# Patient Record
Sex: Female | Born: 1950 | Race: White | Hispanic: No | Marital: Single | Smoking: Never smoker
Health system: Southern US, Community
[De-identification: ages and names within clinical notes are randomized; demographics above are authoritative.]

## PROBLEM LIST (undated history)

## (undated) DIAGNOSIS — T7840XA Allergy, unspecified, initial encounter: Secondary | ICD-10-CM

## (undated) DIAGNOSIS — R739 Hyperglycemia, unspecified: Secondary | ICD-10-CM

## (undated) DIAGNOSIS — R270 Ataxia, unspecified: Secondary | ICD-10-CM

## (undated) DIAGNOSIS — F0390 Unspecified dementia without behavioral disturbance: Secondary | ICD-10-CM

## (undated) DIAGNOSIS — I10 Essential (primary) hypertension: Secondary | ICD-10-CM

## (undated) DIAGNOSIS — E785 Hyperlipidemia, unspecified: Secondary | ICD-10-CM

## (undated) DIAGNOSIS — G4700 Insomnia, unspecified: Secondary | ICD-10-CM

## (undated) DIAGNOSIS — M545 Low back pain, unspecified: Secondary | ICD-10-CM

## (undated) DIAGNOSIS — K219 Gastro-esophageal reflux disease without esophagitis: Secondary | ICD-10-CM

## (undated) HISTORY — DX: Ataxia, unspecified: R27.0

## (undated) HISTORY — DX: Hyperglycemia, unspecified: R73.9

## (undated) HISTORY — DX: Low back pain, unspecified: M54.50

## (undated) HISTORY — DX: Low back pain: M54.5

## (undated) HISTORY — DX: Hyperlipidemia, unspecified: E78.5

## (undated) HISTORY — PX: CATARACT EXTRACTION: SUR2

## (undated) HISTORY — DX: Essential (primary) hypertension: I10

## (undated) HISTORY — DX: Insomnia, unspecified: G47.00

## (undated) HISTORY — DX: Allergy, unspecified, initial encounter: T78.40XA

## (undated) HISTORY — DX: Unspecified dementia, unspecified severity, without behavioral disturbance, psychotic disturbance, mood disturbance, and anxiety: F03.90

## (undated) HISTORY — DX: Gastro-esophageal reflux disease without esophagitis: K21.9

---

## 1989-11-08 HISTORY — PX: ABDOMINAL HYSTERECTOMY: SUR658

## 2009-08-19 LAB — HM DEXA SCAN: HM Dexa Scan: NORMAL

## 2012-02-02 ENCOUNTER — Ambulatory Visit: Payer: Self-pay | Admitting: Ophthalmology

## 2012-02-02 DIAGNOSIS — Z01810 Encounter for preprocedural cardiovascular examination: Secondary | ICD-10-CM

## 2012-02-15 ENCOUNTER — Ambulatory Visit: Payer: Self-pay | Admitting: Ophthalmology

## 2013-11-08 HISTORY — PX: SHOULDER SURGERY: SHX246

## 2015-03-02 NOTE — Op Note (Signed)
PATIENT NAME:  Melissa Wise, Melissa Wise MR#:  [id number] DATE OF BIRTH:  04-18-1951  DATE OF PROCEDURE:  02/15/2012  PREOPERATIVE DIAGNOSIS: Visually significant cataract of the left eye.   POSTOPERATIVE DIAGNOSIS: Visually significant cataract of the left eye.   OPERATIVE PROCEDURE: Cataract extraction by phacoemulsification with implant of intraocular lens to the left eye.   SURGEON: Birder Robson, MD  ANESTHESIA:  1. Managed anesthesia care.  2. 50-50 mixture of 0.75% bupivacaine and 4% Xylocaine given as a retrobulbar block.   COMPLICATIONS: None.   TECHNIQUE:  Stop and chop.   DESCRIPTION OF PROCEDURE: The patient was examined and consented for this procedure in the preoperative holding area and then brought back to the Operating Room where the anesthesia team employed managed anesthesia care.  3.5 milliliters of the aforementioned mixture were placed in the left orbit on an Atkinson needle without complication. The left eye was then prepped and draped in the usual sterile ophthalmic fashion. A lid speculum was placed. The side-port blade was used to create a paracentesis and the anterior chamber was filled with viscoelastic. The keratome was used to create a near clear corneal incision. The continuous curvilinear capsulorrhexis was performed with a cystotome followed by the capsulorrhexis forceps. Hydrodissection and hydrodelineation were carried out with BSS on a blunt cannula. The lens was removed in a stop and chop technique. The remaining cortical material was removed with the irrigation-aspiration handpiece. The capsular bag was inflated with viscoelastic and the Technis ZCB00 15.5-diopter lens, serial number [id number] was placed in the capsular bag without complication. The remaining viscoelastic was removed from the eye with the irrigation-aspiration handpiece. The wounds were hydrated. The anterior chamber was flushed with Miostat. The eye was inflated to a physiologic pressure and the  wounds were found to be water tight. The eye was dressed with Vigamox followed by Maxitrol ointment and a protective shield was placed.   The patient will followup with me in one day.   ____________________________ Livingston Diones. Samone Guhl, MD wlp:cms D: 02/15/2012 12:54:39 ET T: 02/15/2012 13:17:58 ET JOB#: [id number]  cc: Delicia Berens L. Shakthi Scipio, MD, <Dictator> Livingston Diones Jansel Vonstein MD ELECTRONICALLY SIGNED 02/16/2012 11:41

## 2015-10-24 LAB — FECAL OCCULT BLOOD, GUAIAC: Fecal Occult Blood: NEGATIVE

## 2015-12-07 LAB — HM MAMMOGRAPHY: HM MAMMO: NORMAL (ref 0–4)

## 2015-12-31 ENCOUNTER — Ambulatory Visit: Payer: Self-pay | Admitting: Internal Medicine

## 2016-01-07 ENCOUNTER — Telehealth: Payer: Self-pay

## 2016-01-07 ENCOUNTER — Ambulatory Visit: Payer: Self-pay | Admitting: Internal Medicine

## 2016-01-07 NOTE — Telephone Encounter (Signed)
We need to reschedule patients appointment on 01/21/16 because Dr. Mable Fill will not be here.

## 2016-01-08 NOTE — Telephone Encounter (Signed)
Called pt to reschedule 3/15 apt. Now on 3/22 at 11:30

## 2016-01-13 ENCOUNTER — Telehealth: Payer: Self-pay

## 2016-01-13 NOTE — Telephone Encounter (Signed)
Patient called has appointment scheduled for March 22 nd but wants earlier date. Mrs states her allergies have worsened and now having problems with her right hear.   [phone]

## 2016-01-13 NOTE — Telephone Encounter (Signed)
Pt scheduled  

## 2016-01-21 ENCOUNTER — Ambulatory Visit: Payer: Self-pay | Admitting: Internal Medicine

## 2016-01-28 ENCOUNTER — Ambulatory Visit: Payer: Self-pay | Admitting: Internal Medicine

## 2016-02-04 ENCOUNTER — Encounter: Payer: Self-pay | Admitting: Internal Medicine

## 2016-02-04 ENCOUNTER — Ambulatory Visit: Payer: Self-pay | Admitting: Internal Medicine

## 2016-02-04 VITALS — BP 159/87 | HR 96 | Temp 98.3°F | Wt 148.0 lb

## 2016-02-04 DIAGNOSIS — I10 Essential (primary) hypertension: Secondary | ICD-10-CM

## 2016-02-04 DIAGNOSIS — E785 Hyperlipidemia, unspecified: Secondary | ICD-10-CM | POA: Insufficient documentation

## 2016-02-04 MED ORDER — ENALAPRIL MALEATE 2.5 MG PO TABS: 2.5000 mg | ORAL_TABLET | Freq: Every day | ORAL | Status: DC

## 2016-02-04 MED ORDER — AMLODIPINE BESYLATE 10 MG PO TABS: 10.0000 mg | ORAL_TABLET | Freq: Every day | ORAL | Status: DC

## 2016-02-04 MED ORDER — LOVASTATIN 40 MG PO TABS: 40.0000 mg | ORAL_TABLET | Freq: Every day | ORAL | Status: DC

## 2016-02-04 MED ORDER — HYDROCHLOROTHIAZIDE 25 MG PO TABS: 25.0000 mg | ORAL_TABLET | Freq: Every day | ORAL | Status: DC

## 2016-02-04 NOTE — Progress Notes (Signed)
   Subjective:    Patient ID: Melissa Wise, female    DOB: [birth year], 65 y.o.   MRN: [id number]  HPI  Pt had previously been seen by [facility]. Pt was followed for cholesterol and BP. Pt has never taken insulin. Pt needs refills on medications.    Review of Systems     Objective:   Physical Exam  Constitutional: She is oriented to person, place, and time.  Cardiovascular: Normal rate, regular rhythm and normal heart sounds.   Pulmonary/Chest: Effort normal and breath sounds normal.  Neurological: She is alert and oriented to person, place, and time.     BP 159/87 mmHg  Pulse 96  Temp(Src) 98.3 F (36.8 C)  Wt 148 lb (67.132 kg)    Medication List       This list is accurate as of: 02/04/16 11:16 AM.  Always use your most recent med list.               amLODipine 10 MG tablet  Commonly known as:  NORVASC  Take 1 tablet (10 mg total) by mouth daily.     enalapril 2.5 MG tablet  Commonly known as:  VASOTEC  Take 1 tablet (2.5 mg total) by mouth daily.     hydrochlorothiazide 25 MG tablet  Commonly known as:  HYDRODIURIL  Take 1 tablet (25 mg total) by mouth daily.     lovastatin 40 MG tablet  Commonly known as:  MEVACOR  Take 1 tablet (40 mg total) by mouth daily. 1 and 1/2 tablets with dinner           Assessment & Plan:  Mardy was seen today for hypertension and hyperlipidemia.  Diagnoses and all orders for this visit:  Essential hypertension -     Comp Met (CMET) -     CBC w/Diff -     Urinalysis -     TSH -     Lipid Profile -     HgB A1c  Hyperlipidemia  Other orders -     lovastatin (MEVACOR) 40 MG tablet; Take 1 tablet (40 mg total) by mouth daily. 1 and 1/2 tablets with dinner -     enalapril (VASOTEC) 2.5 MG tablet; Take 1 tablet (2.5 mg total) by mouth daily. -     amLODipine (NORVASC) 10 MG tablet; Take 1 tablet (10 mg total) by mouth daily. -     hydrochlorothiazide (HYDRODIURIL) 25 MG tablet; Take 1 tablet (25 mg total)  by mouth daily.   Md f/u: 6 months Labs before: Met c, cbc, ua, tsh, lipid, a1c

## 2016-05-04 ENCOUNTER — Encounter: Payer: Self-pay | Admitting: Family Medicine

## 2016-05-04 ENCOUNTER — Ambulatory Visit (INDEPENDENT_AMBULATORY_CARE_PROVIDER_SITE_OTHER): Payer: PPO | Admitting: Family Medicine

## 2016-05-04 VITALS — BP 135/79 | HR 95 | Temp 99.2°F | Ht 68.0 in | Wt 142.0 lb

## 2016-05-04 DIAGNOSIS — Z23 Encounter for immunization: Secondary | ICD-10-CM

## 2016-05-04 DIAGNOSIS — E785 Hyperlipidemia, unspecified: Secondary | ICD-10-CM

## 2016-05-04 DIAGNOSIS — H269 Unspecified cataract: Secondary | ICD-10-CM | POA: Diagnosis not present

## 2016-05-04 DIAGNOSIS — I10 Essential (primary) hypertension: Secondary | ICD-10-CM | POA: Diagnosis not present

## 2016-05-04 LAB — UA/M W/RFLX CULTURE, ROUTINE
Bilirubin, UA: NEGATIVE
Glucose, UA: NEGATIVE
Ketones, UA: NEGATIVE
LEUKOCYTES UA: NEGATIVE
NITRITE UA: NEGATIVE
Protein, UA: NEGATIVE
Urobilinogen, Ur: 0.2 mg/dL (ref 0.2–1.0)
pH, UA: 7 (ref 5.0–7.5)

## 2016-05-04 LAB — MICROSCOPIC EXAMINATION: EPITHELIAL CELLS (NON RENAL): NONE SEEN /HPF (ref 0–10)

## 2016-05-04 LAB — MICROALBUMIN, URINE WAIVED
Creatinine, Urine Waived: 10 mg/dL (ref 10–300)
MICROALB, UR WAIVED: 10 mg/L (ref 0–19)
Microalb/Creat Ratio: <30 mg/g (ref <30)

## 2016-05-04 LAB — BAYER DCA HB A1C WAIVED: HB A1C: 5.6 % (ref <7.0)

## 2016-05-04 MED ORDER — AMLODIPINE BESYLATE 10 MG PO TABS: 10.0000 mg | ORAL_TABLET | Freq: Every day | ORAL | Status: DC

## 2016-05-04 MED ORDER — LOVASTATIN 40 MG PO TABS: 40.0000 mg | ORAL_TABLET | Freq: Every day | ORAL | Status: DC

## 2016-05-04 MED ORDER — ENALAPRIL MALEATE 2.5 MG PO TABS: 2.5000 mg | ORAL_TABLET | Freq: Every day | ORAL | Status: DC

## 2016-05-04 MED ORDER — HYDROCHLOROTHIAZIDE 25 MG PO TABS: 25.0000 mg | ORAL_TABLET | Freq: Every day | ORAL | Status: DC

## 2016-05-04 NOTE — Assessment & Plan Note (Signed)
Better on recheck. Continue current regimen. Continue to monitor. Call with any concerns. Refills given today. 

## 2016-05-04 NOTE — Assessment & Plan Note (Signed)
Rechecking levels today. Await results. Continue current regimen. Call with concerns.

## 2016-05-04 NOTE — Patient Instructions (Signed)
Pneumococcal Conjugate Vaccine (PCV13)   1. Why get vaccinated?  Vaccination can protect both children and adults from pneumococcal disease.  Pneumococcal disease is caused by bacteria that can spread from person to person through close contact. It can cause ear infections, and it can also lead to more serious infections of the:  · Lungs (pneumonia),  · Blood (bacteremia), and  · Covering of the brain and spinal cord (meningitis).  Pneumococcal pneumonia is most common among adults. Pneumococcal meningitis can cause deafness and brain damage, and it kills about 1 child in 10 who get it.  Anyone can get pneumococcal disease, but children under 2 years of age and adults 65 years and older, people with certain medical conditions, and cigarette smokers are at the highest risk.  Before there was a vaccine, the [place] saw:  · more than 700 cases of meningitis,  · about 13,000 blood infections,  · about 5 million ear infections, and  · about 200 deaths  in children under 5 each year from pneumococcal disease. Since vaccine became available, severe pneumococcal disease in these children has fallen by 88%.  About 18,000 older adults die of pneumococcal disease each year in the [place].  Treatment of pneumococcal infections with penicillin and other drugs is not as effective as it used to be, because some strains of the disease have become resistant to these drugs. This makes prevention of the disease, through vaccination, even more important.  2. PCV13 vaccine  Pneumococcal conjugate vaccine (called PCV13) protects against 13 types of pneumococcal bacteria.  PCV13 is routinely given to children at 2, 4, 6, and 12-15 months of age. It is also recommended for children and adults 2 to 64 years of age with certain health conditions, and for all adults 65 years of age and older. Your doctor can give you details.  3. Some people should not get this vaccine  Anyone who has ever had a life-threatening allergic reaction  to a dose of this vaccine, to an earlier pneumococcal vaccine called PCV7, or to any vaccine containing diphtheria toxoid (for example, DTaP), should not get PCV13.  Anyone with a severe allergy to any component of PCV13 should not get the vaccine. Tell your doctor if the person being vaccinated has any severe allergies.  If the person scheduled for vaccination is not feeling well, your healthcare provider might decide to reschedule the shot on another day.  4. Risks of a vaccine reaction  With any medicine, including vaccines, there is a chance of reactions. These are usually mild and go away on their own, but serious reactions are also possible.  Problems reported following PCV13 varied by age and dose in the series. The most common problems reported among children were:  · About half became drowsy after the shot, had a temporary loss of appetite, or had redness or tenderness where the shot was given.  · About 1 out of 3 had swelling where the shot was given.  · About 1 out of 3 had a mild fever, and about 1 in 20 had a fever over 102.2°F.  · Up to about 8 out of 10 became fussy or irritable.  Adults have reported pain, redness, and swelling where the shot was given; also mild fever, fatigue, headache, chills, or muscle pain.  Young children who get PCV13 along with inactivated flu vaccine at the same time may be at increased risk for seizures caused by fever. Ask your doctor for more information.  Problems that   could happen after any vaccine:  · People sometimes faint after a medical procedure, including vaccination. Sitting or lying down for about 15 minutes can help prevent fainting, and injuries caused by a fall. Tell your doctor if you feel dizzy, or have vision changes or ringing in the ears.  · Some older children and adults get severe pain in the shoulder and have difficulty moving the arm where a shot was given. This happens very rarely.  · Any medication can cause a severe allergic reaction. Such  reactions from a vaccine are very rare, estimated at about 1 in a million doses, and would happen within a few minutes to a few hours after the vaccination.  As with any medicine, there is a very small chance of a vaccine causing a serious injury or death.  The safety of vaccines is always being monitored. For more information, visit: www.cdc.gov/vaccinesafety/  5. What if there is a serious reaction?  What should I look for?  · Look for anything that concerns you, such as signs of a severe allergic reaction, very high fever, or unusual behavior.  Signs of a severe allergic reaction can include hives, swelling of the face and throat, difficulty breathing, a fast heartbeat, dizziness, and weakness-usually within a few minutes to a few hours after the vaccination.  What should I do?  · If you think it is a severe allergic reaction or other emergency that can't wait, call 9-1-1 or get the person to the nearest hospital. Otherwise, call your doctor.  Reactions should be reported to the Vaccine Adverse Event Reporting System (VAERS). Your doctor should file this report, or you can do it yourself through the VAERS web site at www.vaers.hhs.gov, or by calling 1-800-822-7967.  VAERS does not give medical advice.  6. The National Vaccine Injury Compensation Program  The National Vaccine Injury Compensation Program (VICP) is a federal program that was created to compensate people who may have been injured by certain vaccines.  Persons who believe they may have been injured by a vaccine can learn about the program and about filing a claim by calling 1-800-338-2382 or visiting the VICP website at www.hrsa.gov/vaccinecompensation. There is a time limit to file a claim for compensation.  7. How can I learn more?  · Ask your healthcare provider. He or she can give you the vaccine package insert or suggest other sources of information.  · Call your local or state health department.  · Contact the Centers for Disease Control and  Prevention (CDC):    Call 1-800-232-4636 (1-800-CDC-INFO) or    Visit CDC's website at www.cdc.gov/vaccines  Vaccine Information Statement  PCV13 Vaccine (09/12/2014)     This information is not intended to replace advice given to you by your health care provider. Make sure you discuss any questions you have with your health care provider.     Document Released: 08/22/2006 Document Revised: 11/15/2014 Document Reviewed: 09/19/2014  Elsevier Interactive Patient Education ©2016 Elsevier Inc.

## 2016-05-04 NOTE — Progress Notes (Signed)
BP 135/79 mmHg  Pulse 95  Temp(Src) 99.2 F (37.3 C)  Ht 5\' 8"  (1.727 m)  Wt 142 lb (64.411 kg)  BMI 21.60 kg/m2  SpO2 99%   Subjective:    Patient ID: Melissa Wise, female    DOB: Mar 09, 1951, 65 y.o.   MRN: [id number]  HPI: Melissa Wise is a 65 y.o. female who presents today to establish care  Chief Complaint  Patient presents with  . Hyperlipidemia    Patient needs a refill on her Cholesterol medication   Hasn't seen a doctor in about 6 months due to insurance. Was seeing [name] previously  HYPERTENSION / HYPERLIPIDEMIA Satisfied with current treatment? yes Duration of hypertension: chronic BP monitoring frequency: daily BP range: 110s-120/80s BP medication side effects: no Past BP meds: HCTZ, enlalapril, amlodipine Duration of hyperlipidemia: chronic Cholesterol medication side effects: no Cholesterol supplements: fish oil Past cholesterol medications: lovastatin Medication compliance: excellent compliance Aspirin: no Recent stressors: no Recurrent headaches: no Visual changes: no Palpitations: no Dyspnea: no Chest pain: no Lower extremity edema: no Dizzy/lightheaded: no  Active Ambulatory Problems    Diagnosis Date Noted  . Hypertension 02/04/2016  . Hyperlipidemia 02/04/2016   Resolved Ambulatory Problems    Diagnosis Date Noted  . No Resolved Ambulatory Problems   Past Medical History  Diagnosis Date  . Allergy   . GERD (gastroesophageal reflux disease)   . Insomnia   . Hyperglycemia    Past Surgical History  Procedure Laterality Date  . Shoulder surgery Right 2015  . Abdominal hysterectomy    . Cataract extraction Left    No Known Allergies  Family History  Problem Relation Age of Onset  . Cancer Mother   . Hypertension Father   . Heart attack Father   . Diabetes Maternal Grandmother   . Cancer Paternal Grandmother     Stomach   Social History   Social History  . Marital Status: Single    Spouse Name: N/A  . Number of  Children: N/A  . Years of Education: N/A   Occupational History  . Not on file.   Social History Main Topics  . Smoking status: Never Smoker   . Smokeless tobacco: Never Used  . Alcohol Use: 0.0 oz/week    0 Standard drinks or equivalent per week     Comment: occassionally  . Drug Use: No  . Sexual Activity: Not Currently   Other Topics Concern  . Not on file   Social History Narrative    Review of Systems  Constitutional: Negative.   Respiratory: Negative.   Cardiovascular: Negative.   Musculoskeletal: Negative.   Psychiatric/Behavioral: Negative.     Per HPI unless specifically indicated above     Objective:    BP 135/79 mmHg  Pulse 95  Temp(Src) 99.2 F (37.3 C)  Ht 5\' 8"  (1.727 m)  Wt 142 lb (64.411 kg)  BMI 21.60 kg/m2  SpO2 99%  Wt Readings from Last 3 Encounters:  05/04/16 142 lb (64.411 kg)  02/04/16 148 lb (67.132 kg)    Physical Exam  Constitutional: She is oriented to person, place, and time. She appears well-developed and well-nourished. No distress.  HENT:  Head: Normocephalic and atraumatic.  Right Ear: Hearing normal.  Left Ear: Hearing normal.  Nose: Nose normal.  Eyes: Conjunctivae and lids are normal. Right eye exhibits no discharge. Left eye exhibits no discharge. No scleral icterus.  Cardiovascular: Normal rate, regular rhythm, normal heart sounds and intact distal pulses.  Exam reveals no gallop and  no friction rub.   No murmur heard. Pulmonary/Chest: Effort normal and breath sounds normal. No respiratory distress. She has no wheezes. She has no rales. She exhibits no tenderness.  Musculoskeletal: Normal range of motion.  Neurological: She is alert and oriented to person, place, and time.  Skin: Skin is warm, dry and intact. No rash noted. No erythema. No pallor.  Psychiatric: She has a normal mood and affect. Her speech is normal and behavior is normal. Judgment and thought content normal. Cognition and memory are normal.  Nursing  note and vitals reviewed.   Results for orders placed or performed in visit on 02/02/12  Potassium  Result Value Ref Range   Potassium 3.5 3.5-5.1 mmol/L      Assessment & Plan:   Problem List Items Addressed This Visit      Cardiovascular and Mediastinum   Hypertension - Primary    Better on recheck. Continue current regimen. Continue to monitor. Call with any concerns. Refills given today.      Relevant Medications   amLODipine (NORVASC) 10 MG tablet   enalapril (VASOTEC) 2.5 MG tablet   hydrochlorothiazide (HYDRODIURIL) 25 MG tablet   lovastatin (MEVACOR) 40 MG tablet   Other Relevant Orders   Bayer DCA Hb A1c Waived   CBC with Differential/Platelet   Comprehensive metabolic panel   Microalbumin, Urine Waived   TSH   UA/M w/rflx Culture, Routine     Other   Hyperlipidemia    Rechecking levels today. Await results. Continue current regimen. Call with concerns.       Relevant Medications   amLODipine (NORVASC) 10 MG tablet   enalapril (VASOTEC) 2.5 MG tablet   hydrochlorothiazide (HYDRODIURIL) 25 MG tablet   lovastatin (MEVACOR) 40 MG tablet   Other Relevant Orders   Bayer DCA Hb A1c Waived   CBC with Differential/Platelet   Comprehensive metabolic panel   Lipid Panel w/o Chol/HDL Ratio    Other Visit Diagnoses    Immunization due        Prevnar given today    Cataract        Referral back to [facility]    Relevant Orders    Ambulatory referral to Ophthalmology        Follow up plan: Return in about 6 months (around 11/03/2016) for Wellness, Records Release from [facility] Please.

## 2016-05-05 ENCOUNTER — Encounter: Payer: Self-pay | Admitting: Family Medicine

## 2016-05-05 LAB — CBC WITH DIFFERENTIAL/PLATELET
BASOS: 0 %
Basophils Absolute: 0 10*3/uL (ref 0.0–0.2)
EOS (ABSOLUTE): 0 10*3/uL (ref 0.0–0.4)
Eos: 0 %
HEMATOCRIT: 43.3 % (ref 34.0–46.6)
HEMOGLOBIN: 14.4 g/dL (ref 11.1–15.9)
IMMATURE GRANS (ABS): 0 10*3/uL (ref 0.0–0.1)
IMMATURE GRANULOCYTES: 0 %
LYMPHS ABS: 2.2 10*3/uL (ref 0.7–3.1)
Lymphs: 27 %
MCH: 28.9 pg (ref 26.6–33.0)
MCHC: 33.3 g/dL (ref 31.5–35.7)
MCV: 87 fL (ref 79–97)
MONOS ABS: 0.4 10*3/uL (ref 0.1–0.9)
Monocytes: 5 %
NEUTROS ABS: 5.5 10*3/uL (ref 1.4–7.0)
NEUTROS PCT: 68 %
Platelets: 236 10*3/uL (ref 150–379)
RBC: 4.99 x10E6/uL (ref 3.77–5.28)
RDW: 14 % (ref 12.3–15.4)
WBC: 8.2 10*3/uL (ref 3.4–10.8)

## 2016-05-05 LAB — COMPREHENSIVE METABOLIC PANEL
ALBUMIN: 4.8 g/dL (ref 3.6–4.8)
ALK PHOS: 56 IU/L (ref 39–117)
ALT: 16 IU/L (ref 0–32)
AST: 23 IU/L (ref 0–40)
Albumin/Globulin Ratio: 2.2 (ref 1.2–2.2)
BILIRUBIN TOTAL: 0.7 mg/dL (ref 0.0–1.2)
BUN / CREAT RATIO: 12 (ref 12–28)
BUN: 9 mg/dL (ref 8–27)
CHLORIDE: 91 mmol/L — AB (ref 96–106)
CO2: 27 mmol/L (ref 18–29)
Calcium: 9.9 mg/dL (ref 8.7–10.3)
Creatinine, Ser: 0.75 mg/dL (ref 0.57–1.00)
GFR calc Af Amer: 97 mL/min/{1.73_m2} (ref >59)
GFR calc non Af Amer: 84 mL/min/{1.73_m2} (ref >59)
GLUCOSE: 95 mg/dL (ref 65–99)
Globulin, Total: 2.2 g/dL (ref 1.5–4.5)
Potassium: 4.1 mmol/L (ref 3.5–5.2)
Sodium: 134 mmol/L (ref 134–144)
Total Protein: 7 g/dL (ref 6.0–8.5)

## 2016-05-05 LAB — LIPID PANEL W/O CHOL/HDL RATIO
CHOLESTEROL TOTAL: 137 mg/dL (ref 100–199)
HDL: 65 mg/dL (ref >39)
LDL Calculated: 47 mg/dL (ref 0–99)
Triglycerides: 123 mg/dL (ref 0–149)
VLDL CHOLESTEROL CAL: 25 mg/dL (ref 5–40)

## 2016-05-05 LAB — TSH: TSH: 0.98 u[IU]/mL (ref 0.450–4.500)

## 2016-06-09 ENCOUNTER — Encounter: Payer: Self-pay | Admitting: Family Medicine

## 2016-06-09 DIAGNOSIS — R270 Ataxia, unspecified: Secondary | ICD-10-CM | POA: Insufficient documentation

## 2016-06-22 ENCOUNTER — Telehealth: Payer: Self-pay | Admitting: Family Medicine

## 2016-06-22 NOTE — Telephone Encounter (Signed)
Called pt, scheduled appt for 06/25/16 @ 1:30pm. Thanks.

## 2016-06-22 NOTE — Telephone Encounter (Signed)
Needs appointment

## 2016-06-22 NOTE — Telephone Encounter (Signed)
Pt called stated she is having a lot of pain in her left foot, wants to know if she should come in. Pt stated that her foot is swollen and that he has discussed this with Dr. Wynetta Emery previously. Pt needs advice. Please call pt ASAP. Thanks.

## 2016-06-25 ENCOUNTER — Ambulatory Visit (INDEPENDENT_AMBULATORY_CARE_PROVIDER_SITE_OTHER): Payer: PPO | Admitting: Family Medicine

## 2016-06-25 ENCOUNTER — Encounter: Payer: Self-pay | Admitting: Family Medicine

## 2016-06-25 VITALS — BP 132/78 | HR 87 | Temp 98.8°F | Wt 138.0 lb

## 2016-06-25 DIAGNOSIS — I10 Essential (primary) hypertension: Secondary | ICD-10-CM

## 2016-06-25 DIAGNOSIS — R29818 Other symptoms and signs involving the nervous system: Secondary | ICD-10-CM | POA: Diagnosis not present

## 2016-06-25 DIAGNOSIS — R2689 Other abnormalities of gait and mobility: Secondary | ICD-10-CM

## 2016-06-25 DIAGNOSIS — R609 Edema, unspecified: Secondary | ICD-10-CM

## 2016-06-25 DIAGNOSIS — R278 Other lack of coordination: Secondary | ICD-10-CM | POA: Diagnosis not present

## 2016-06-25 DIAGNOSIS — R41840 Attention and concentration deficit: Secondary | ICD-10-CM

## 2016-06-25 MED ORDER — LOSARTAN POTASSIUM 50 MG PO TABS: 50.0000 mg | ORAL_TABLET | Freq: Every day | ORAL | 1 refills | Status: DC

## 2016-06-25 NOTE — Assessment & Plan Note (Signed)
Cough on enalapril. Will stop it. Swelling on amlodipine. Will stop it. Will start losartan. Recheck BP 2 weeks. Cut in 1/2 and call if getting dizzy.

## 2016-06-25 NOTE — Progress Notes (Signed)
BP 132/78 (BP Location: Left Arm, Patient Position: Sitting, Cuff Size: Normal)   Pulse 87   Temp 98.8 F (37.1 C)   Wt 138 lb (62.6 kg)   SpO2 100%   BMI 20.98 kg/m    Subjective:    Patient ID: Melissa Wise, female    DOB: [birth year], 65 y.o.   MRN: [id number]  HPI: Melissa Wise is a 65 y.o. female  Chief Complaint  Patient presents with  . Foot Pain  . Edema  . Fatigue   FOOT PAIN Duration: 3 weeks foot has been swollen. R better than L one Involved foot: bilateral Mechanism of injury: unknown Location: bilateral feet Onset: sudden  Severity: tingling no discomfort  Quality:  tingling Frequency: intermittent Radiation: no Aggravating factors: nothing, amlodipine   Alleviating factors: nothing   Status: stable Treatments attempted: nothing   Relief with NSAIDs?:  No NSAIDs Taken Weakness with weight bearing or walking: no Morning stiffness: no Swelling: yes Redness: no Bruising: no Paresthesias / decreased sensation: yes  Fevers:no  Here today with her sister who expresses some concern about her. She notes that for the last couple of years, her balance and dexterity have decreased as has her concentration. 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 She now notes that she can't walk straight, that using her hands has become more difficult and that her health in general has declined. She was supposed to see a neurologist with her previous PCP, but lost her insurance before the appointment. She did have a MRI done in November which was normal. Her labs have been looking pretty normal as well.  Relevant past medical, surgical, family and social history reviewed and updated as indicated. Interim medical history since our last visit reviewed. Allergies and medications reviewed and updated.  Review of Systems  Constitutional: Negative.   Respiratory: Negative.   Cardiovascular: Positive for leg  swelling. Negative for chest pain and palpitations.  Neurological: Negative for dizziness, tremors, seizures, syncope, facial asymmetry, speech difficulty, light-headedness, numbness and headaches.  Hematological: Negative.   Psychiatric/Behavioral: Positive for confusion and decreased concentration. Negative for agitation, behavioral problems, dysphoric mood, hallucinations, self-injury, sleep disturbance and suicidal ideas. The patient is not nervous/anxious and is not hyperactive.     Per HPI unless specifically indicated above     Objective:    BP 132/78 (BP Location: Left Arm, Patient Position: Sitting, Cuff Size: Normal)   Pulse 87   Temp 98.8 F (37.1 C)   Wt 138 lb (62.6 kg)   SpO2 100%   BMI 20.98 kg/m   Wt Readings from Last 3 Encounters:  06/25/16 138 lb (62.6 kg)  05/04/16 142 lb (64.4 kg)  02/04/16 148 lb (67.1 kg)    Physical Exam  Constitutional: She is oriented to person, place, and time. She appears well-developed and well-nourished. No distress.  HENT:  Head: Normocephalic and atraumatic.  Right Ear: Hearing normal.  Left Ear: Hearing normal.  Nose: Nose normal.  Eyes: Conjunctivae and lids are normal. Right eye exhibits no discharge. Left eye exhibits no discharge. No scleral icterus.  Cardiovascular: Normal rate, regular rhythm, normal heart sounds and intact distal pulses.  Exam reveals no gallop and no friction rub.   No murmur heard. Pulmonary/Chest: Effort normal and breath sounds normal. No respiratory distress. She has no wheezes. She has no rales. She exhibits no tenderness.  Musculoskeletal: Normal range of motion. She exhibits edema (3+  edema on the L, 2+ edema on the R).  Neurological: She is alert and oriented to person, place, and time. She has normal reflexes.  No cogwheeling  Skin: Skin is warm, dry and intact. No rash noted. No erythema. No pallor.  Psychiatric: Her speech is normal. Judgment and thought content normal. Her affect is blunt.  She is slowed. Cognition and memory are normal.  Nursing note and vitals reviewed.   Results for orders placed or performed in visit on 06/09/16  HM MAMMOGRAPHY  Result Value Ref Range   HM Mammogram Self Reported Normal 0-4 Bi-Rad, Self Reported Normal  HM DEXA SCAN  Result Value Ref Range   HM Dexa Scan Normal   Fecal Occult Blood, Guaiac  Result Value Ref Range   Fecal Occult Blood Negative       Assessment & Plan:   Problem List Items Addressed This Visit      Cardiovascular and Mediastinum   Hypertension - Primary    Cough on enalapril. Will stop it. Swelling on amlodipine. Will stop it. Will start losartan. Recheck BP 2 weeks. Cut in 1/2 and call if getting dizzy.      Relevant Medications   losartan (COZAAR) 50 MG tablet    Other Visit Diagnoses    Edema, unspecified type       Likely due to amlodipine. Will stop it. Will elevate feet. Call if not better by Monday and will give her a couple of doses of lasix.   Balance problems       Unclear etiology. Will get her into see neurology. Referral generated today. MRI normal in 11/16, Labs normal in June.    Relevant Orders   Ambulatory referral to Neurology   Decreased dexterity       Unclear etiology. Will get her into see neurology. Referral generated today. MRI normal in 11/16, Labs normal in June.    Relevant Orders   Ambulatory referral to Neurology   Concentration deficit       Unclear etiology. Will get her into see neurology. Referral generated today. MRI normal in 11/16, Labs normal in June.    Relevant Orders   Ambulatory referral to Neurology       Follow up plan: Return in about 2 weeks (around 07/09/2016) for follow up BP.

## 2016-06-28 DIAGNOSIS — M2042 Other hammer toe(s) (acquired), left foot: Secondary | ICD-10-CM | POA: Diagnosis not present

## 2016-06-28 DIAGNOSIS — M2041 Other hammer toe(s) (acquired), right foot: Secondary | ICD-10-CM | POA: Diagnosis not present

## 2016-06-28 DIAGNOSIS — B351 Tinea unguium: Secondary | ICD-10-CM | POA: Diagnosis not present

## 2016-07-07 ENCOUNTER — Telehealth: Payer: Self-pay | Admitting: Family Medicine

## 2016-07-07 NOTE — Telephone Encounter (Signed)
Routing to provider.  Adding note to Losartan patient only taking 1/2.

## 2016-07-07 NOTE — Telephone Encounter (Signed)
While rescheduling her appt the pt stated she is only taking half of the losartan (COZAAR) 50 MG tablet because the 50MG  was too much. She also stated her feet and legs were still swelling.

## 2016-07-09 ENCOUNTER — Ambulatory Visit: Payer: PPO | Admitting: Family Medicine

## 2016-07-13 DIAGNOSIS — H26492 Other secondary cataract, left eye: Secondary | ICD-10-CM | POA: Diagnosis not present

## 2016-07-16 ENCOUNTER — Ambulatory Visit: Payer: PPO | Admitting: Family Medicine

## 2016-07-16 ENCOUNTER — Emergency Department: Payer: PPO

## 2016-07-16 ENCOUNTER — Inpatient Hospital Stay
Admission: EM | Admit: 2016-07-16 | Discharge: 2016-07-19 | DRG: 565 | Disposition: A | Discharge status: Skilled Nursing Facility | Payer: PPO | Attending: Specialist | Admitting: Specialist

## 2016-07-16 DIAGNOSIS — S199XXA Unspecified injury of neck, initial encounter: Secondary | ICD-10-CM | POA: Diagnosis not present

## 2016-07-16 DIAGNOSIS — W19XXXA Unspecified fall, initial encounter: Secondary | ICD-10-CM | POA: Diagnosis present

## 2016-07-16 DIAGNOSIS — Z79899 Other long term (current) drug therapy: Secondary | ICD-10-CM | POA: Diagnosis not present

## 2016-07-16 DIAGNOSIS — R1310 Dysphagia, unspecified: Secondary | ICD-10-CM | POA: Diagnosis not present

## 2016-07-16 DIAGNOSIS — E860 Dehydration: Secondary | ICD-10-CM | POA: Diagnosis not present

## 2016-07-16 DIAGNOSIS — D72829 Elevated white blood cell count, unspecified: Secondary | ICD-10-CM | POA: Diagnosis present

## 2016-07-16 DIAGNOSIS — G20 Parkinson's disease: Secondary | ICD-10-CM | POA: Diagnosis present

## 2016-07-16 DIAGNOSIS — F05 Delirium due to known physiological condition: Secondary | ICD-10-CM

## 2016-07-16 DIAGNOSIS — R296 Repeated falls: Secondary | ICD-10-CM | POA: Diagnosis not present

## 2016-07-16 DIAGNOSIS — R269 Unspecified abnormalities of gait and mobility: Secondary | ICD-10-CM

## 2016-07-16 DIAGNOSIS — T796XXA Traumatic ischemia of muscle, initial encounter: Principal | ICD-10-CM | POA: Diagnosis present

## 2016-07-16 DIAGNOSIS — E785 Hyperlipidemia, unspecified: Secondary | ICD-10-CM | POA: Diagnosis present

## 2016-07-16 DIAGNOSIS — S0990XA Unspecified injury of head, initial encounter: Secondary | ICD-10-CM | POA: Diagnosis not present

## 2016-07-16 DIAGNOSIS — R531 Weakness: Secondary | ICD-10-CM

## 2016-07-16 DIAGNOSIS — M6282 Rhabdomyolysis: Secondary | ICD-10-CM | POA: Diagnosis present

## 2016-07-16 DIAGNOSIS — Y92009 Unspecified place in unspecified non-institutional (private) residence as the place of occurrence of the external cause: Secondary | ICD-10-CM | POA: Diagnosis not present

## 2016-07-16 DIAGNOSIS — I10 Essential (primary) hypertension: Secondary | ICD-10-CM | POA: Diagnosis present

## 2016-07-16 DIAGNOSIS — E871 Hypo-osmolality and hyponatremia: Secondary | ICD-10-CM | POA: Diagnosis not present

## 2016-07-16 DIAGNOSIS — K219 Gastro-esophageal reflux disease without esophagitis: Secondary | ICD-10-CM | POA: Diagnosis present

## 2016-07-16 DIAGNOSIS — R2689 Other abnormalities of gait and mobility: Secondary | ICD-10-CM | POA: Diagnosis not present

## 2016-07-16 LAB — CBC
HEMATOCRIT: 40.6 % (ref 35.0–47.0)
Hemoglobin: 14.3 g/dL (ref 12.0–16.0)
MCH: 29.9 pg (ref 26.0–34.0)
MCHC: 35.2 g/dL (ref 32.0–36.0)
MCV: 85 fL (ref 80.0–100.0)
Platelets: 208 10*3/uL (ref 150–440)
RBC: 4.77 MIL/uL (ref 3.80–5.20)
RDW: 13.6 % (ref 11.5–14.5)
WBC: 17.7 10*3/uL — ABNORMAL HIGH (ref 3.6–11.0)

## 2016-07-16 LAB — BASIC METABOLIC PANEL
Anion gap: 6 (ref 5–15)
BUN: 12 mg/dL (ref 6–20)
CALCIUM: 9.2 mg/dL (ref 8.9–10.3)
CO2: 30 mmol/L (ref 22–32)
Chloride: 91 mmol/L — ABNORMAL LOW (ref 101–111)
Creatinine, Ser: 0.54 mg/dL (ref 0.44–1.00)
GFR calc Af Amer: >60 mL/min (ref >60)
GLUCOSE: 117 mg/dL — AB (ref 65–99)
Potassium: 3.7 mmol/L (ref 3.5–5.1)
Sodium: 127 mmol/L — ABNORMAL LOW (ref 135–145)

## 2016-07-16 LAB — TROPONIN I: TROPONIN I: 0.03 ng/mL — AB (ref <0.03)

## 2016-07-16 LAB — GLUCOSE, CAPILLARY: GLUCOSE-CAPILLARY: 113 mg/dL — AB (ref 65–99)

## 2016-07-16 MED ORDER — SODIUM CHLORIDE 0.9 % IV BOLUS (SEPSIS)
1000.0000 mL | Freq: Once | INTRAVENOUS | Status: AC
  Administered 2016-07-17: 1000 mL via INTRAVENOUS

## 2016-07-16 NOTE — ED Triage Notes (Addendum)
Pt reports to ED w/ c/o multiple falls today.  Pt remembers all falls, denies LOC, sts "I'm weak and my legs gave out".  Pt sts that she hit head x 3 times today.  Pt able to move all limbs w/ some pain.  Pt c/o soreness in back, neck and R shoulder.  Pt denies CP, SOB, LOC or dizziness. Pt alert, answers questions w/o issue. NAD. Pts family sts that pt has had gradual decline in weight and dexterity

## 2016-07-16 NOTE — ED Notes (Signed)
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 Pt reports she has problems controlling her muscles. Pt has neurology appointment scheduled for October 5th. Pt reports 3 falls today, and another in the past 6 months. Pt reports first fall was this afternoon over the trash can. Pt reports another fall in the kitchen, and one in the bedroom. Pt reports hitting her head with falls, however denies LOC. Pt denies weakness/dizziness, however pt required 2 RNs to move from wheelchair to bed. Pt was not able to support her body weight.

## 2016-07-16 NOTE — ED Notes (Signed)
Patient in CT scan at this time.

## 2016-07-16 NOTE — ED Provider Notes (Signed)
[facility] Emergency Department Provider Note   ____________________________________________   First MD Initiated Contact with Patient 07/16/16 2314     (approximate)  I have reviewed the triage vital signs and the nursing notes.   HISTORY  Chief Complaint Fall    HPI Melissa Wise is a 65 y.o. female who presents to the ED from home with a chief complaint of multiple falls and generalized weakness. Per patient's family member, she has had a steady decline in her health over the past 6 months. Patient has declined so much that she lost her job due to inability to work. She has had unsteady gait for which she is post to have an appointment with a neurologist this fall. However, patient has not had falls until today.Reports 4 falls today secondary to "I'm weak in my legs gave out". States that she struck her head 3 times. Denies use of anticoagulants. Complains of head and neck pain. States she was laying on the floor for over 3 hours before her family found her. Denies losing consciousness. She was able to ambulate with assistance after her falls. Denies use of cane or walker normally. Denies recent fever, chills, chest pain, shortness of breath, abdominal pain, nausea, vomiting, diarrhea. Denies recent travel. Nothing makes her symptoms better or worse.   Past Medical History:  Diagnosis Date  . Allergy   . Ataxia   . GERD (gastroesophageal reflux disease)   . Hyperglycemia   . Hyperlipidemia   . Hypertension   . Insomnia   . Low back pain     Patient Active Problem List   Diagnosis Date Noted  . Rhabdomyolysis 07/17/2016  . Ataxia   . Hypertension 02/04/2016  . Hyperlipidemia 02/04/2016    Past Surgical History:  Procedure Laterality Date  . ABDOMINAL HYSTERECTOMY  1991   Complete, no cancer  . CATARACT EXTRACTION Left   . SHOULDER SURGERY Right 2015    Prior to Admission medications   Medication Sig Start Date End Date Taking?  Authorizing Provider  b complex vitamins capsule Take 1 capsule by mouth daily.   Yes Historical Provider, MD  COCONUT OIL PO Take by mouth.   Yes Historical Provider, MD  hydrochlorothiazide (HYDRODIURIL) 25 MG tablet Take 1 tablet (25 mg total) by mouth daily. 05/04/16  Yes Megan P Johnson, DO  Kelp 0.15 MG TABS Take by mouth.   Yes Historical Provider, MD  losartan (COZAAR) 50 MG tablet Take 1 tablet (50 mg total) by mouth daily. 06/25/16  Yes Megan P Johnson, DO  lovastatin (MEVACOR) 40 MG tablet Take 1 tablet (40 mg total) by mouth daily. 1 and 1/2 tablets with dinner 05/04/16  Yes Megan P Johnson, DO  Magnesium 400 MG CAPS Take by mouth.   Yes Historical Provider, MD  Multiple Vitamin (MULTIVITAMIN) tablet Take 1 tablet by mouth daily.   Yes Historical Provider, MD  Omega-3 Fatty Acids (FISH OIL) 1000 MG CAPS Take 300 mg by mouth.    Yes Historical Provider, MD  OVER THE COUNTER MEDICATION VitaFusion Fiber Well, sugar free gummies with digestive health, regularity support and probiotic   Yes Historical Provider, MD  UNABLE TO FIND 500 mg. tumeric   Yes Historical Provider, MD  Vitamin D, Cholecalciferol, 1000 units CAPS Take 1,000 Units by mouth.   Yes Historical Provider, MD    Allergies Amlodipine and Enalapril  Family History  Problem Relation Age of Onset  . Cancer Mother   . Hypertension Father   .  Heart attack Father   . Diabetes Maternal Grandmother   . Cancer Paternal Grandmother     Stomach    Social History Social History  Substance Use Topics  . Smoking status: Never Smoker  . Smokeless tobacco: Never Used  . Alcohol use 0.0 oz/week     Comment: occassionally    Review of Systems  Constitutional: Positive for generalized weakness. No fever/chills. Eyes: No visual changes. ENT: No sore throat. Cardiovascular: Denies chest pain. Respiratory: Denies shortness of breath. Gastrointestinal: No abdominal pain.  No nausea, no vomiting.  No diarrhea.  No  constipation. Genitourinary: Negative for dysuria. Musculoskeletal: Negative for back pain. Skin: Negative for rash. Neurological: Positive for unsteady gait. Negative for headaches, focal weakness or numbness.  10-point ROS otherwise negative.  ____________________________________________   PHYSICAL EXAM:  VITAL SIGNS: ED Triage Vitals  Enc Vitals Group     BP 07/16/16 2054 (!) 91/59     Pulse Rate 07/16/16 2054 94     Resp 07/16/16 2054 20     Temp 07/16/16 2054 98.1 F (36.7 C)     Temp Source 07/16/16 2054 Oral     SpO2 07/16/16 2054 100 %     Weight 07/16/16 2054 135 lb (61.2 kg)     Height 07/16/16 2054 5\' 8"  (1.727 m)     Head Circumference --      Peak Flow --      Pain Score 07/16/16 2301 4     Pain Loc --      Pain Edu? --      Excl. in [place]? --     Constitutional: Alert and oriented. Well appearing and in no acute distress. Eyes: Conjunctivae are normal. PERRL. EOMI. Head: Atraumatic. Nose: No congestion/rhinnorhea. Mouth/Throat: Mucous membranes are moist.  Oropharynx non-erythematous. Neck: No stridor.  No cervical spine tenderness to palpation. Cardiovascular: Normal rate, regular rhythm. Grossly normal heart sounds.  Good peripheral circulation. Respiratory: Normal respiratory effort.  No retractions. Lungs CTAB. Gastrointestinal: Soft and nontender. No distention. No abdominal bruits. No CVA tenderness. Musculoskeletal: No lower extremity tenderness nor edema.  No joint effusions. Neurologic:  Normal speech and language. Delay cognition. No gross focal neurologic deficits are appreciated.  Skin:  Skin is warm, dry and intact. No rash noted. Psychiatric: Mood and affect are normal. Speech and behavior are normal.  ____________________________________________   LABS (all labs ordered are listed, but only abnormal results are displayed)  Labs Reviewed  BASIC METABOLIC PANEL - Abnormal; Notable for the following:       Result Value   Sodium 127 (*)     Chloride 91 (*)    Glucose, Bld 117 (*)    All other components within normal limits  CBC - Abnormal; Notable for the following:    WBC 17.7 (*)    All other components within normal limits  URINALYSIS COMPLETEWITH MICROSCOPIC ([facility] ONLY) - Abnormal; Notable for the following:    Color, Urine YELLOW (*)    APPearance HAZY (*)    Glucose, UA 150 (*)    All other components within normal limits  GLUCOSE, CAPILLARY - Abnormal; Notable for the following:    Glucose-Capillary 113 (*)    All other components within normal limits  CK - Abnormal; Notable for the following:    Total CK 1,345 (*)    All other components within normal limits  TROPONIN I - Abnormal; Notable for the following:    Troponin I 0.03 (*)    All other components  within normal limits  TSH  HEMOGLOBIN A1C  CBG MONITORING, ED   ____________________________________________  EKG  ED ECG REPORT I, Rozalia Dino J, the attending physician, personally viewed and interpreted this ECG.   Date: 07/16/2016  EKG Time: 2111  Rate: 91  Rhythm: normal EKG, normal sinus rhythm  Axis: Normal  Intervals:none  ST&T Change: Nonspecific  ____________________________________________  RADIOLOGY  CT head and cervical spine interpreted per Dr. Quintella Reichert: No acute intracranial hemorrhage.    No acute/traumatic cervical spine pathology.    ____________________________________________   PROCEDURES  Procedure(s) performed: None  Procedures  Critical Care performed: No  ____________________________________________   INITIAL IMPRESSION / ASSESSMENT AND PLAN / ED COURSE  Pertinent labs & imaging results that were available during my care of the patient were reviewed by me and considered in my medical decision making (see chart for details).  65 year old female who presents status post frequent falls today; steady decline in her health over the past 6 months. CT imaging studies are negative for acute traumatic injuries.  Laboratory results demonstrate leukocytosis, hyponatremia. Will check urinalysis, add chest x-ray and orthostatic vital signs. Anticipate hospital admission for generalized weakness and frequent falls secondary to unsteady gait.  Clinical Course  Comment By Time  Laboratory results notable for rhabdomyolysis and elevated troponin. Patient required heavy assistance obtaining orthostatics. Will discuss with hospitalist to evaluate patient in the emergency department for admission. Paulette Blanch, MD 09/09 [phone]     ____________________________________________   FINAL CLINICAL IMPRESSION(S) / ED DIAGNOSES  Final diagnoses:  Fall, initial encounter  Weakness  Gait abnormality  Hyponatremia  Traumatic rhabdomyolysis, initial encounter ([place])      NEW MEDICATIONS STARTED DURING THIS VISIT:  Current Discharge Medication List       Note:  This document was prepared using Dragon voice recognition software and may include unintentional dictation errors.    Paulette Blanch, MD 07/17/16 [phone]

## 2016-07-17 DIAGNOSIS — M6282 Rhabdomyolysis: Secondary | ICD-10-CM | POA: Diagnosis present

## 2016-07-17 DIAGNOSIS — R296 Repeated falls: Secondary | ICD-10-CM | POA: Diagnosis not present

## 2016-07-17 DIAGNOSIS — I10 Essential (primary) hypertension: Secondary | ICD-10-CM | POA: Diagnosis not present

## 2016-07-17 DIAGNOSIS — E871 Hypo-osmolality and hyponatremia: Secondary | ICD-10-CM | POA: Diagnosis not present

## 2016-07-17 DIAGNOSIS — G20 Parkinson's disease: Secondary | ICD-10-CM | POA: Diagnosis not present

## 2016-07-17 DIAGNOSIS — R531 Weakness: Secondary | ICD-10-CM | POA: Diagnosis not present

## 2016-07-17 LAB — URINALYSIS COMPLETE WITH MICROSCOPIC (ARMC ONLY)
BILIRUBIN URINE: NEGATIVE
Bacteria, UA: NONE SEEN
GLUCOSE, UA: 150 mg/dL — AB
HGB URINE DIPSTICK: NEGATIVE
Ketones, ur: NEGATIVE mg/dL
Leukocytes, UA: NEGATIVE
Nitrite: NEGATIVE
Protein, ur: NEGATIVE mg/dL
Specific Gravity, Urine: 1.016 (ref 1.005–1.030)
Squamous Epithelial / LPF: NONE SEEN
pH: 6 (ref 5.0–8.0)

## 2016-07-17 LAB — HEMOGLOBIN A1C: Hgb A1c MFr Bld: 5.2 % (ref 4.0–6.0)

## 2016-07-17 LAB — TSH: TSH: 0.832 u[IU]/mL (ref 0.350–4.500)

## 2016-07-17 LAB — CK: Total CK: 1345 U/L — ABNORMAL HIGH (ref 38–234)

## 2016-07-17 MED ORDER — HYDROCHLOROTHIAZIDE 25 MG PO TABS: 25.0000 mg | ORAL_TABLET | Freq: Every day | ORAL | Status: DC

## 2016-07-17 MED ORDER — ACETAMINOPHEN 650 MG RE SUPP: 650.0000 mg | Freq: Four times a day (QID) | RECTAL | Status: DC | PRN

## 2016-07-17 MED ORDER — B COMPLEX-C PO TABS
1.0000 | ORAL_TABLET | Freq: Every day | ORAL | Status: DC
  Administered 2016-07-17 – 2016-07-19 (×3): 1 via ORAL
  Filled 2016-07-17 (×4): qty 1

## 2016-07-17 MED ORDER — SODIUM CHLORIDE 0.9 % IV SOLN
INTRAVENOUS | Status: DC
  Administered 2016-07-17 (×2): via INTRAVENOUS

## 2016-07-17 MED ORDER — DOCUSATE SODIUM 100 MG PO CAPS
100.0000 mg | ORAL_CAPSULE | Freq: Two times a day (BID) | ORAL | Status: DC
  Administered 2016-07-17 – 2016-07-19 (×4): 100 mg via ORAL
  Filled 2016-07-17 (×4): qty 1

## 2016-07-17 MED ORDER — SODIUM CHLORIDE 0.9% FLUSH
3.0000 mL | Freq: Two times a day (BID) | INTRAVENOUS | Status: DC
  Administered 2016-07-17 – 2016-07-19 (×4): 3 mL via INTRAVENOUS

## 2016-07-17 MED ORDER — ACETAMINOPHEN 325 MG PO TABS
650.0000 mg | ORAL_TABLET | Freq: Four times a day (QID) | ORAL | Status: DC | PRN
  Administered 2016-07-17: 650 mg via ORAL
  Filled 2016-07-17: qty 2

## 2016-07-17 MED ORDER — B COMPLEX VITAMINS PO CAPS: 1.0000 | ORAL_CAPSULE | Freq: Every day | ORAL | Status: DC

## 2016-07-17 MED ORDER — ENOXAPARIN SODIUM 40 MG/0.4ML ~~LOC~~ SOLN
40.0000 mg | SUBCUTANEOUS | Status: DC
  Administered 2016-07-17 – 2016-07-18 (×2): 40 mg via SUBCUTANEOUS
  Filled 2016-07-17 (×2): qty 0.4

## 2016-07-17 MED ORDER — VITAMIN D 1000 UNITS PO TABS
1000.0000 [IU] | ORAL_TABLET | Freq: Every day | ORAL | Status: DC
  Administered 2016-07-17 – 2016-07-19 (×3): 1000 [IU] via ORAL
  Filled 2016-07-17 (×3): qty 1

## 2016-07-17 MED ORDER — ONDANSETRON HCL 4 MG/2ML IJ SOLN: 4.0000 mg | Freq: Four times a day (QID) | INTRAMUSCULAR | Status: DC | PRN

## 2016-07-17 MED ORDER — ONDANSETRON HCL 4 MG PO TABS
4.0000 mg | ORAL_TABLET | Freq: Four times a day (QID) | ORAL | Status: DC | PRN
Start: 2016-07-17 — End: 2016-07-19

## 2016-07-17 MED ORDER — LOSARTAN POTASSIUM 50 MG PO TABS: 25.0000 mg | ORAL_TABLET | Freq: Every day | ORAL | Status: DC

## 2016-07-17 MED ORDER — ADULT MULTIVITAMIN W/MINERALS CH
1.0000 | ORAL_TABLET | Freq: Every day | ORAL | Status: DC
  Administered 2016-07-17 – 2016-07-19 (×3): 1 via ORAL
  Filled 2016-07-17 (×3): qty 1

## 2016-07-17 NOTE — ED Notes (Signed)
Called floor to let them know pt on the way 

## 2016-07-17 NOTE — Progress Notes (Signed)
Pt has orders for telemetry and continuous pulse ox. Per Dr. Posey Pronto, both orders may be discontinued. [facility] tele has been notified.

## 2016-07-17 NOTE — ED Notes (Signed)
MD Diamond at bedside. 

## 2016-07-17 NOTE — Progress Notes (Signed)
Patient transferred from [facility] to my assignment at 3pm.

## 2016-07-17 NOTE — Progress Notes (Addendum)
[facility] [place] NAME: Burnadine Fullam    MR#:  [id number]  DATE OF BIRTH:  [birth year]  SUBJECTIVE:  Came in with progressive weakness and recurrent falls at home Found to have rhabdo  REVIEW OF SYSTEMS:   Review of Systems  Constitutional: Negative for chills, fever and weight loss.  HENT: Negative for ear discharge, ear pain and nosebleeds.   Eyes: Negative for blurred vision, pain and discharge.  Respiratory: Negative for sputum production, shortness of breath, wheezing and stridor.   Cardiovascular: Negative for chest pain, palpitations, orthopnea and PND.  Gastrointestinal: Negative for abdominal pain, diarrhea, nausea and vomiting.  Genitourinary: Negative for frequency and urgency.  Musculoskeletal: Positive for falls and myalgias. Negative for back pain and joint pain.  Neurological: Positive for weakness. Negative for sensory change, speech change and focal weakness.  Psychiatric/Behavioral: Negative for depression and hallucinations. The patient is not nervous/anxious.    Tolerating Diet: Tolerating PT:   DRUG ALLERGIES:   Allergies  Allergen Reactions  . Amlodipine Swelling  . Enalapril Cough    VITALS:  Blood pressure 103/60, pulse 74, temperature 97.9 F (36.6 C), temperature source Oral, resp. rate 18, height 5\' 8"  (1.727 m), weight 63.5 kg (139 lb 14.4 oz), SpO2 100 %.  PHYSICAL EXAMINATION:   Physical Exam  GENERAL:  65 y.o.-year-old patient lying in the bed with no acute distress.  EYES: Pupils equal, round, reactive to light and accommodation. No scleral icterus. Extraocular muscles intact.  HEENT: Head atraumatic, normocephalic. Oropharynx and nasopharynx clear.  NECK:  Supple, no jugular venous distention. No thyroid enlargement, no tenderness.  LUNGS: Normal breath sounds bilaterally, no wheezing, rales, rhonchi. No use of accessory muscles of respiration.  CARDIOVASCULAR: S1, S2 normal. No  murmurs, rubs, or gallops.  ABDOMEN: Soft, nontender, nondistended. Bowel sounds present. No organomegaly or mass.  EXTREMITIES: No cyanosis, clubbing or edema b/l.    NEUROLOGIC: Cranial nerves II through XII are intact. No focal Motor or sensory deficits b/l.  Subjective weakness PSYCHIATRIC:  patient is alert and oriented x 3.  SKIN: No obvious rash, lesion, or ulcer.   LABORATORY PANEL:  CBC  Recent Labs Lab 07/16/16 2231  WBC 17.7*  HGB 14.3  HCT 40.6  PLT 208    Chemistries   Recent Labs Lab 07/16/16 2231  NA 127*  K 3.7  CL 91*  CO2 30  GLUCOSE 117*  BUN 12  CREATININE 0.54  CALCIUM 9.2   Cardiac Enzymes  Recent Labs Lab 07/16/16 2231  TROPONINI 0.03*   RADIOLOGY:  Ct Head Wo Contrast  Result Date: 07/16/2016 CLINICAL DATA:  65 year old female with fall EXAM: CT HEAD WITHOUT CONTRAST CT CERVICAL SPINE WITHOUT CONTRAST TECHNIQUE: Multidetector CT imaging of the head and cervical spine was performed following the standard protocol without intravenous contrast. Multiplanar CT image reconstructions of the cervical spine were also generated. COMPARISON:  None. FINDINGS: CT HEAD FINDINGS The ventricles and sulci appropriate size for patient's age. Minimal periventricular and deep white matter chronic microvascular ischemic changes noted. There is no acute intracranial hemorrhage. No mass effect or midline shift noted. The visualized paranasal sinuses and mastoid air cells are clear. The calvarium is intact. CT CERVICAL SPINE FINDINGS There is no acute fracture or subluxation of the cervical spine.There is osteopenia with multilevel degenerative changes with bone spurring. C2-C4 anterior on bridging osteophyte noted.The odontoid and spinous processes are intact.There is normal anatomic alignment of the C1-C2 lateral masses. The visualized  soft tissues appear unremarkable. IMPRESSION: No acute intracranial hemorrhage. No acute/traumatic cervical spine pathology.  Electronically Signed   By: Anner Crete M.D.   On: 07/16/2016 22:37   Ct Cervical Spine Wo Contrast  Result Date: 07/16/2016 CLINICAL DATA:  65 year old female with fall EXAM: CT HEAD WITHOUT CONTRAST CT CERVICAL SPINE WITHOUT CONTRAST TECHNIQUE: Multidetector CT imaging of the head and cervical spine was performed following the standard protocol without intravenous contrast. Multiplanar CT image reconstructions of the cervical spine were also generated. COMPARISON:  None. FINDINGS: CT HEAD FINDINGS The ventricles and sulci appropriate size for patient's age. Minimal periventricular and deep white matter chronic microvascular ischemic changes noted. There is no acute intracranial hemorrhage. No mass effect or midline shift noted. The visualized paranasal sinuses and mastoid air cells are clear. The calvarium is intact. CT CERVICAL SPINE FINDINGS There is no acute fracture or subluxation of the cervical spine.There is osteopenia with multilevel degenerative changes with bone spurring. C2-C4 anterior on bridging osteophyte noted.The odontoid and spinous processes are intact.There is normal anatomic alignment of the C1-C2 lateral masses. The visualized soft tissues appear unremarkable. IMPRESSION: No acute intracranial hemorrhage. No acute/traumatic cervical spine pathology. Electronically Signed   By: Anner Crete M.D.   On: 07/16/2016 22:37   Dg Chest Port 1 View  Result Date: 07/17/2016 CLINICAL DATA:  Status post multiple falls, with generalized weakness and upper back pain. Right shoulder pain. Initial encounter. EXAM: PORTABLE CHEST 1 VIEW COMPARISON:  None. FINDINGS: The lungs are well-aerated. Minimal left basilar atelectasis is noted. There is no evidence of pleural effusion or pneumothorax. The cardiomediastinal silhouette is within normal limits. No acute osseous abnormalities are seen. Hardware is noted along the proximal right humerus, incompletely imaged on this study. IMPRESSION: No  displaced rib fracture seen. Minimal left basilar atelectasis noted. Lungs otherwise clear. Electronically Signed   By: Garald Balding M.D.   On: 07/17/2016 00:55   ASSESSMENT AND PLAN:  65 year old female admitted for rhabdomyolysis. 1. Acute mild Rhabdomyolysis: Elevated CK; normal renal function. Hydrate with intravenous fluid. Hold statin drugs.  2. Motor slowing: Associated with masklike facies, mild dysphagia and cognitive rigidity consistent with possible Parkinson's. -Neurology consulted MRI brain as out pt negative  -3. Hypertension: Controlled; continue Cozaar  -d/ced HCTZ due to hyponatremia  4. DVT prophylaxis: Lovenox  Case discussed with Care Management/Social Worker. Management plans discussed with the patient, family and they are in agreement.  CODE STATUS: FULL   TOTAL TIME TAKING CARE OF THIS PATIENT: 36minutes.  >50% time spent on counselling and coordination of care  POSSIBLE D/C IN 1-2 DAYS, DEPENDING ON CLINICAL CONDITION.  Note: This dictation was prepared with Dragon dictation along with smaller phrase technology. Any transcriptional errors that result from this process are unintentional.  Lissie Hinesley M.D on 07/17/2016 at 3:22 PM  Between 7am to 6pm - Pager - [phone]  After 6pm go to [email or web address] - password [email or web address]  Office  [phone]  CC: Primary care physician; Park Liter, DO

## 2016-07-17 NOTE — H&P (Signed)
Melissa Wise is an 65 y.o. female.   Chief Complaint: Falls HPI: The patient with past medical history of hypertension presents to the emergency department after being found on the floor of her home. She denies loss of consciousness, chest pain, dizziness, nausea, vomiting or diarrhea. The patient states that she has fallen at least 4 times today the last which made her too weak to pick herself up. She reports that she did on the ground for at least 2 hours by the time she was able to reach help. She is transferred to the emergency department where she was found to have elevated creatinine kinase and generalized stiffness. The patient reports that she has some change to her voice. She also admits that she's been progressively weaker over 6 months and has been falling very frequently lately. Her primary care doctor had referred her to neurology but this appointment is not scheduled until next month. Due to generalized weakness, motor slowing and rhabdomyolysis emergency department staff called the hospitalist service for admission.  Past Medical History:  Diagnosis Date  . Allergy   . Ataxia   . GERD (gastroesophageal reflux disease)   . Hyperglycemia   . Hyperlipidemia   . Hypertension   . Insomnia   . Low back pain     Past Surgical History:  Procedure Laterality Date  . ABDOMINAL HYSTERECTOMY  1991   Complete, no cancer  . CATARACT EXTRACTION Left   . SHOULDER SURGERY Right 2015    Family History  Problem Relation Age of Onset  . Cancer Mother   . Hypertension Father   . Heart attack Father   . Diabetes Maternal Grandmother   . Cancer Paternal Grandmother     Stomach   Social History:  reports that she has never smoked. She has never used smokeless tobacco. She reports that she drinks alcohol. She reports that she does not use drugs.  Allergies:  Allergies  Allergen Reactions  . Amlodipine Swelling  . Enalapril Cough    Medications Prior to Admission  Medication Sig  Dispense Refill  . b complex vitamins capsule Take 1 capsule by mouth daily.    . COCONUT OIL PO Take by mouth.    . hydrochlorothiazide (HYDRODIURIL) 25 MG tablet Take 1 tablet (25 mg total) by mouth daily. 90 tablet 1  . Kelp 0.15 MG TABS Take by mouth.    . losartan (COZAAR) 50 MG tablet Take 1 tablet (50 mg total) by mouth daily. 30 tablet 1  . lovastatin (MEVACOR) 40 MG tablet Take 1 tablet (40 mg total) by mouth daily. 1 and 1/2 tablets with dinner 135 tablet 1  . Magnesium 400 MG CAPS Take by mouth.    . Multiple Vitamin (MULTIVITAMIN) tablet Take 1 tablet by mouth daily.    . Omega-3 Fatty Acids (FISH OIL) 1000 MG CAPS Take 300 mg by mouth.     [name] OVER THE COUNTER MEDICATION VitaFusion Fiber Well, sugar free gummies with digestive health, regularity support and probiotic    . UNABLE TO FIND 500 mg. tumeric    . Vitamin D, Cholecalciferol, 1000 units CAPS Take 1,000 Units by mouth.      Results for orders placed or performed during the hospital encounter of 07/16/16 (from the past 48 hour(s))  Urinalysis complete, with microscopic     Status: Abnormal   Collection Time: 07/16/16 12:15 AM  Result Value Ref Range   Color, Urine YELLOW (A) YELLOW   APPearance HAZY (A) CLEAR   Glucose,  UA 150 (A) NEGATIVE mg/dL   Bilirubin Urine NEGATIVE NEGATIVE   Ketones, ur NEGATIVE NEGATIVE mg/dL   Specific Gravity, Urine 1.016 1.005 - 1.030   Hgb urine dipstick NEGATIVE NEGATIVE   pH 6.0 5.0 - 8.0   Protein, ur NEGATIVE NEGATIVE mg/dL   Nitrite NEGATIVE NEGATIVE   Leukocytes, UA NEGATIVE NEGATIVE   RBC / HPF 0-5 0 - 5 RBC/hpf   WBC, UA 0-5 0 - 5 WBC/hpf   Bacteria, UA NONE SEEN NONE SEEN   Squamous Epithelial / LPF NONE SEEN NONE SEEN   Mucous PRESENT    Amorphous Crystal PRESENT   Basic metabolic panel     Status: Abnormal   Collection Time: 07/16/16 10:31 PM  Result Value Ref Range   Sodium 127 (L) 135 - 145 mmol/L   Potassium 3.7 3.5 - 5.1 mmol/L   Chloride 91 (L) 101 - 111 mmol/L    CO2 30 22 - 32 mmol/L   Glucose, Bld 117 (H) 65 - 99 mg/dL   BUN 12 6 - 20 mg/dL   Creatinine, Ser 0.54 0.44 - 1.00 mg/dL   Calcium 9.2 8.9 - 10.3 mg/dL   GFR calc non Af Amer >60 >60 mL/min   GFR calc Af Amer >60 >60 mL/min    Comment: (NOTE) The eGFR has been calculated using the CKD EPI equation. This calculation has not been validated in all clinical situations. eGFR's persistently <60 mL/min signify possible Chronic Kidney Disease.    Anion gap 6 5 - 15  CBC     Status: Abnormal   Collection Time: 07/16/16 10:31 PM  Result Value Ref Range   WBC 17.7 (H) 3.6 - 11.0 K/uL   RBC 4.77 3.80 - 5.20 MIL/uL   Hemoglobin 14.3 12.0 - 16.0 g/dL   HCT 40.6 35.0 - 47.0 %   MCV 85.0 80.0 - 100.0 fL   MCH 29.9 26.0 - 34.0 pg   MCHC 35.2 32.0 - 36.0 g/dL   RDW 13.6 11.5 - 14.5 %   Platelets 208 150 - 440 K/uL  CK     Status: Abnormal   Collection Time: 07/16/16 10:31 PM  Result Value Ref Range   Total CK 1,345 (H) 38 - 234 U/L  Troponin I     Status: Abnormal   Collection Time: 07/16/16 10:31 PM  Result Value Ref Range   Troponin I 0.03 (HH) <0.03 ng/mL    Comment: CRITICAL RESULT CALLED TO, READ BACK BY AND VERIFIED WITH JENNA ELLINGTON AT 0002 07/17/16.PMH  TSH     Status: None   Collection Time: 07/16/16 10:31 PM  Result Value Ref Range   TSH 0.832 0.350 - 4.500 uIU/mL  Glucose, capillary     Status: Abnormal   Collection Time: 07/16/16 10:32 PM  Result Value Ref Range   Glucose-Capillary 113 (H) 65 - 99 mg/dL   Ct Head Wo Contrast  Result Date: 07/16/2016 CLINICAL DATA:  65 year old female with fall EXAM: CT HEAD WITHOUT CONTRAST CT CERVICAL SPINE WITHOUT CONTRAST TECHNIQUE: Multidetector CT imaging of the head and cervical spine was performed following the standard protocol without intravenous contrast. Multiplanar CT image reconstructions of the cervical spine were also generated. COMPARISON:  None. FINDINGS: CT HEAD FINDINGS The ventricles and sulci appropriate size for  patient's age. Minimal periventricular and deep white matter chronic microvascular ischemic changes noted. There is no acute intracranial hemorrhage. No mass effect or midline shift noted. The visualized paranasal sinuses and mastoid air cells are clear. The calvarium  is intact. CT CERVICAL SPINE FINDINGS There is no acute fracture or subluxation of the cervical spine.There is osteopenia with multilevel degenerative changes with bone spurring. C2-C4 anterior on bridging osteophyte noted.The odontoid and spinous processes are intact.There is normal anatomic alignment of the C1-C2 lateral masses. The visualized soft tissues appear unremarkable. IMPRESSION: No acute intracranial hemorrhage. No acute/traumatic cervical spine pathology. Electronically Signed   By: Arash  Radparvar M.D.   On: 07/16/2016 22:37   Ct Cervical Spine Wo Contrast  Result Date: 07/16/2016 CLINICAL DATA:  65-year-old female with fall EXAM: CT HEAD WITHOUT CONTRAST CT CERVICAL SPINE WITHOUT CONTRAST TECHNIQUE: Multidetector CT imaging of the head and cervical spine was performed following the standard protocol without intravenous contrast. Multiplanar CT image reconstructions of the cervical spine were also generated. COMPARISON:  None. FINDINGS: CT HEAD FINDINGS The ventricles and sulci appropriate size for patient's age. Minimal periventricular and deep white matter chronic microvascular ischemic changes noted. There is no acute intracranial hemorrhage. No mass effect or midline shift noted. The visualized paranasal sinuses and mastoid air cells are clear. The calvarium is intact. CT CERVICAL SPINE FINDINGS There is no acute fracture or subluxation of the cervical spine.There is osteopenia with multilevel degenerative changes with bone spurring. C2-C4 anterior on bridging osteophyte noted.The odontoid and spinous processes are intact.There is normal anatomic alignment of the C1-C2 lateral masses. The visualized soft tissues appear  unremarkable. IMPRESSION: No acute intracranial hemorrhage. No acute/traumatic cervical spine pathology. Electronically Signed   By: Arash  Radparvar M.D.   On: 07/16/2016 22:37   Dg Chest Port 1 View  Result Date: 07/17/2016 CLINICAL DATA:  Status post multiple falls, with generalized weakness and upper back pain. Right shoulder pain. Initial encounter. EXAM: PORTABLE CHEST 1 VIEW COMPARISON:  None. FINDINGS: The lungs are well-aerated. Minimal left basilar atelectasis is noted. There is no evidence of pleural effusion or pneumothorax. The cardiomediastinal silhouette is within normal limits. No acute osseous abnormalities are seen. Hardware is noted along the proximal right humerus, incompletely imaged on this study. IMPRESSION: No displaced rib fracture seen. Minimal left basilar atelectasis noted. Lungs otherwise clear. Electronically Signed   By: Jeffery  Chang M.D.   On: 07/17/2016 00:55    Review of Systems  Constitutional: Negative for chills and fever.  HENT: Negative for sore throat and tinnitus.   Eyes: Negative for blurred vision and redness.  Respiratory: Negative for cough and shortness of breath.   Cardiovascular: Negative for chest pain, palpitations, orthopnea and PND.  Gastrointestinal: Negative for abdominal pain, diarrhea, nausea and vomiting.  Genitourinary: Negative for dysuria, frequency and urgency.  Musculoskeletal: Positive for falls. Negative for joint pain and myalgias.  Skin: Negative for rash.       No lesions  Neurological: Positive for weakness. Negative for speech change and focal weakness.  Endo/Heme/Allergies: Does not bruise/bleed easily.       No temperature intolerance  Psychiatric/Behavioral: Negative for depression and suicidal ideas.    Blood pressure 129/63, pulse 85, temperature 98 F (36.7 C), temperature source Oral, resp. rate 18, height 5' 8" (1.727 m), weight 63.5 kg (139 lb 14.4 oz), SpO2 100 %. Physical Exam  Vitals  reviewed. Constitutional: She is oriented to person, place, and time. She appears well-developed and well-nourished. No distress.  HENT:  Head: Normocephalic and atraumatic.  Mouth/Throat: Oropharynx is clear and moist.  Eyes: Conjunctivae and EOM are normal. Pupils are equal, round, and reactive to light. No scleral icterus.  Neck: Normal range of motion. Neck supple.   No JVD present. No tracheal deviation present. No thyromegaly present.  Cardiovascular: Normal rate, regular rhythm and normal heart sounds.  Exam reveals no gallop and no friction rub.   No murmur heard. Respiratory: Effort normal and breath sounds normal.  GI: Soft. Bowel sounds are normal. She exhibits no distension. There is no tenderness.  Genitourinary:  Genitourinary Comments: Deferred  Musculoskeletal: Normal range of motion. She exhibits no edema.  Lymphadenopathy:    She has no cervical adenopathy.  Neurological: She is alert and oriented to person, place, and time. No cranial nerve deficit. She exhibits normal muscle tone.  Rolling tremor  Skin: Skin is warm and dry. No rash noted. No erythema.  Psychiatric: She has a normal mood and affect. Her behavior is normal. Judgment and thought content normal.     Assessment/Plan This is a 65-year-old female admitted for rhabdomyolysis. 1. Rhabdomyolysis: Elevated CK; normal renal function. Hydrate with intravenous fluid. Hold statin drugs. 2. Motor slowing: Associated with masklike facies, mild dysphagia and cognitive rigidity consistent with Parkinson's. I place neurology consult for initiation of dopaminergic drugs. 3. Hypertension: Controlled; continue Cozaar and hydro-chlorothiazide 4. DVT prophylaxis: Lovenox 5. GI prophylaxis: None The patient is a full code. Time spent on admission orders and patient care proximally 45 minutes  ,   S, MD 07/17/2016, 6:18 AM   

## 2016-07-18 DIAGNOSIS — E871 Hypo-osmolality and hyponatremia: Secondary | ICD-10-CM | POA: Diagnosis not present

## 2016-07-18 DIAGNOSIS — R269 Unspecified abnormalities of gait and mobility: Secondary | ICD-10-CM | POA: Diagnosis not present

## 2016-07-18 DIAGNOSIS — T796XXD Traumatic ischemia of muscle, subsequent encounter: Secondary | ICD-10-CM | POA: Diagnosis not present

## 2016-07-18 DIAGNOSIS — I10 Essential (primary) hypertension: Secondary | ICD-10-CM | POA: Diagnosis not present

## 2016-07-18 DIAGNOSIS — T796XXA Traumatic ischemia of muscle, initial encounter: Principal | ICD-10-CM

## 2016-07-18 DIAGNOSIS — F05 Delirium due to known physiological condition: Secondary | ICD-10-CM | POA: Diagnosis not present

## 2016-07-18 DIAGNOSIS — R259 Unspecified abnormal involuntary movements: Secondary | ICD-10-CM | POA: Diagnosis not present

## 2016-07-18 DIAGNOSIS — G20 Parkinson's disease: Secondary | ICD-10-CM | POA: Diagnosis not present

## 2016-07-18 DIAGNOSIS — M6282 Rhabdomyolysis: Secondary | ICD-10-CM | POA: Diagnosis not present

## 2016-07-18 LAB — VITAMIN B12: Vitamin B-12: 414 pg/mL (ref 180–914)

## 2016-07-18 LAB — CK: CK TOTAL: 490 U/L — AB (ref 38–234)

## 2016-07-18 LAB — SODIUM: Sodium: 134 mmol/L — ABNORMAL LOW (ref 135–145)

## 2016-07-18 LAB — SEDIMENTATION RATE: SED RATE: 6 mm/h (ref 0–30)

## 2016-07-18 MED ORDER — CARBIDOPA-LEVODOPA 25-100 MG PO TABS
1.0000 | ORAL_TABLET | Freq: Two times a day (BID) | ORAL | Status: DC
  Administered 2016-07-19 (×2): 1 via ORAL
  Filled 2016-07-18 (×2): qty 1

## 2016-07-18 NOTE — Consult Note (Signed)
Reason for Consult:Falls Referring Physician: Verdell Carmine  CC: Falls  HPI: Flecia Shutter is an 65 y.o. female who presents after multiple falls in one day.  With the last fall was unable to get off the floor and remained there for a few hours before any was able to get to her.  Presented in rhabdo.  From review of the chart and per patient she has had stiffness and difficulty with gait for about 2 years now.  Was to see neurology but was unable to make appointments.  Describes memory problems that she says have only been present for about 2 weeks but per chart have at least been for 1 year.  Handwriting worse.  Patient reports voice is softer.  No tremor.  No reported hallucinations.  Stiff and slow.  Has had blood work to include TSH.  B12 low normal.  MRI of the brain was unremarkable.      Past Medical History:  Diagnosis Date  . Allergy   . Ataxia   . GERD (gastroesophageal reflux disease)   . Hyperglycemia   . Hyperlipidemia   . Hypertension   . Insomnia   . Low back pain     Past Surgical History:  Procedure Laterality Date  . ABDOMINAL HYSTERECTOMY  1991   Complete, no cancer  . CATARACT EXTRACTION Left   . SHOULDER SURGERY Right 2015    Family History  Problem Relation Age of Onset  . Cancer Mother   . Hypertension Father   . Heart attack Father   . Diabetes Maternal Grandmother   . Cancer Paternal Grandmother     Stomach    Social History:  reports that she has never smoked. She has never used smokeless tobacco. She reports that she drinks alcohol. She reports that she does not use drugs.  Allergies  Allergen Reactions  . Amlodipine Swelling  . Enalapril Cough    Medications:  I have reviewed the patient's current medications. Prior to Admission:  Prescriptions Prior to Admission  Medication Sig Dispense Refill Last Dose  . b complex vitamins capsule Take 1 capsule by mouth daily.   07/16/2016 at Unknown time  . COCONUT OIL PO Take by mouth.   07/16/2016 at  Unknown time  . hydrochlorothiazide (HYDRODIURIL) 25 MG tablet Take 1 tablet (25 mg total) by mouth daily. 90 tablet 1 07/16/2016 at Unknown time  . Kelp 0.15 MG TABS Take by mouth.   07/16/2016 at Unknown time  . losartan (COZAAR) 50 MG tablet Take 1 tablet (50 mg total) by mouth daily. 30 tablet 1 07/16/2016 at Unknown time  . lovastatin (MEVACOR) 40 MG tablet Take 1 tablet (40 mg total) by mouth daily. 1 and 1/2 tablets with dinner 135 tablet 1 07/16/2016 at Unknown time  . Magnesium 400 MG CAPS Take by mouth.   07/16/2016 at Unknown time  . Multiple Vitamin (MULTIVITAMIN) tablet Take 1 tablet by mouth daily.   07/16/2016 at Unknown time  . Omega-3 Fatty Acids (FISH OIL) 1000 MG CAPS Take 300 mg by mouth.    07/16/2016 at Unknown time  . OVER THE COUNTER MEDICATION VitaFusion Fiber Well, sugar free gummies with digestive health, regularity support and probiotic   07/16/2016 at Unknown time  . UNABLE TO FIND 500 mg. tumeric   07/16/2016 at Unknown time  . Vitamin D, Cholecalciferol, 1000 units CAPS Take 1,000 Units by mouth.   07/16/2016 at Unknown time   Scheduled: . B-complex with vitamin C  1 tablet Oral Daily  .  cholecalciferol  1,000 Units Oral Daily  . docusate sodium  100 mg Oral BID  . enoxaparin (LOVENOX) injection  40 mg Subcutaneous Q24H  . multivitamin with minerals  1 tablet Oral Daily  . sodium chloride flush  3 mL Intravenous Q12H    ROS: History obtained from the patient  General ROS: negative for - chills, fatigue, fever, night sweats, weight gain or weight loss Psychological ROS: memory difficulties Ophthalmic ROS: negative for - blurry vision, double vision, eye pain or loss of vision ENT ROS: negative for - epistaxis, nasal discharge, oral lesions, sore throat, tinnitus or vertigo Allergy and Immunology ROS: negative for - hives or itchy/watery eyes Hematological and Lymphatic ROS: negative for - bleeding problems, bruising or swollen lymph nodes Endocrine ROS: negative for -  galactorrhea, hair pattern changes, polydipsia/polyuria or temperature intolerance Respiratory ROS: negative for - cough, hemoptysis, shortness of breath or wheezing Cardiovascular ROS: leg swelling Gastrointestinal ROS: negative for - abdominal pain, diarrhea, hematemesis, nausea/vomiting or stool incontinence Genito-Urinary ROS: negative for - dysuria, hematuria, incontinence or urinary frequency/urgency Musculoskeletal ROS: negative for - joint swelling or muscular weakness Neurological ROS: as noted in HPI Dermatological ROS: negative for rash and skin lesion changes  Physical Examination: Blood pressure 99/64, pulse 73, temperature 98.1 F (36.7 C), temperature source Axillary, resp. rate 18, height '5\' 8"'  (1.727 m), weight 64.5 kg (142 lb 4.8 oz), SpO2 99 %.  HEENT-  Normocephalic, no lesions, without obvious abnormality.  Normal external eye and conjunctiva.  Normal TM's bilaterally.  Normal auditory canals and external ears. Normal external nose, mucus membranes and septum.  Normal pharynx.  Masked facies. Cardiovascular- S1, S2 normal, pulses palpable throughout   Lungs- chest clear, no wheezing, rales, normal symmetric air entry Abdomen- soft, non-tender; bowel sounds normal; no masses,  no organomegaly Extremities- 2+ edema Lymph-no adenopathy palpable Musculoskeletal-no joint tenderness, deformity or swelling Skin-warm and dry, no hyperpigmentation, vitiligo, or suspicious lesions  Neurological Examination Mental Status: Alert, oriented, thought content appropriate.  Speech fluent without evidence of aphasia.  Not able to count backward by 7's and must be prompted excessively to even understand the directions.  Able to follow 3 step commands without difficulty. Cranial Nerves: II: Discs flat bilaterally; Visual fields grossly normal, pupils equal, round, reactive to light and accommodation III,IV, VI: ptosis not present, extra-ocular motions intact bilaterally V,VII: smile  symmetric, facial light touch sensation normal bilaterally VIII: hearing normal bilaterally IX,X: gag reflex present XI: bilateral shoulder shrug XII: midline tongue extension Motor: 4/5 throughout with increased tone and bradykinesia.  Unable to extend arms and legs out fully as if mildly contracted Sensory: Pinprick and light touch intact throughout, bilaterally Deep Tendon Reflexes: 2+ in the upper extremities, 1+ at the knees and absent at the ankles.   Plantars: Right: equivocal   Left: equivocal Cerebellar: Normal finger-to-nose and normal heel-to-shin testing bilaterally Gait: not tested due to safety concerns.     Laboratory Studies:   Basic Metabolic Panel:  Recent Labs Lab 07/16/16 2231 07/18/16 0535  NA 127* 134*  K 3.7  --   CL 91*  --   CO2 30  --   GLUCOSE 117*  --   BUN 12  --   CREATININE 0.54  --   CALCIUM 9.2  --     Liver Function Tests: No results for input(s): AST, ALT, ALKPHOS, BILITOT, PROT, ALBUMIN in the last 168 hours. No results for input(s): LIPASE, AMYLASE in the last 168 hours. No results for input(s):  AMMONIA in the last 168 hours.  CBC:  Recent Labs Lab 07/16/16 2231  WBC 17.7*  HGB 14.3  HCT 40.6  MCV 85.0  PLT 208    Cardiac Enzymes:  Recent Labs Lab 07/16/16 2231 07/18/16 0535  CKTOTAL 1,345* 490*  TROPONINI 0.03*  --     BNP: Invalid input(s): POCBNP  CBG:  Recent Labs Lab 07/16/16 2232  GLUCAP 113*    Microbiology: Results for orders placed or performed in visit on 05/04/16  Microscopic Examination     Status: None   Collection Time: 05/04/16  3:25 PM  Result Value Ref Range Status   WBC, UA 0-5 0 - 5 /hpf Final   RBC, UA 0-2 0 - 2 /hpf Final   Epithelial Cells (non renal) None seen 0 - 10 /hpf Final   Bacteria, UA Few None seen/Few Final    Coagulation Studies: No results for input(s): LABPROT, INR in the last 72 hours.  Urinalysis:  Recent Labs Lab 07/16/16 0015  COLORURINE YELLOW*   LABSPEC 1.016  PHURINE 6.0  GLUCOSEU 150*  HGBUR NEGATIVE  BILIRUBINUR NEGATIVE  KETONESUR NEGATIVE  PROTEINUR NEGATIVE  NITRITE NEGATIVE  LEUKOCYTESUR NEGATIVE    Lipid Panel:     Component Value Date/Time   CHOL 137 05/04/2016 1525   TRIG 123 05/04/2016 1525   HDL 65 05/04/2016 1525   LDLCALC 47 05/04/2016 1525    HgbA1C:  Lab Results  Component Value Date   HGBA1C 5.2 07/16/2016    Urine Drug Screen:  No results found for: LABOPIA, COCAINSCRNUR, LABBENZ, AMPHETMU, THCU, LABBARB  Alcohol Level: No results for input(s): ETH in the last 168 hours.  Other results: EKG: normal sinus rhythm at 91 bpm.  Imaging: Ct Head Wo Contrast  Result Date: 07/16/2016 CLINICAL DATA:  65 year old female with fall EXAM: CT HEAD WITHOUT CONTRAST CT CERVICAL SPINE WITHOUT CONTRAST TECHNIQUE: Multidetector CT imaging of the head and cervical spine was performed following the standard protocol without intravenous contrast. Multiplanar CT image reconstructions of the cervical spine were also generated. COMPARISON:  None. FINDINGS: CT HEAD FINDINGS The ventricles and sulci appropriate size for patient's age. Minimal periventricular and deep white matter chronic microvascular ischemic changes noted. There is no acute intracranial hemorrhage. No mass effect or midline shift noted. The visualized paranasal sinuses and mastoid air cells are clear. The calvarium is intact. CT CERVICAL SPINE FINDINGS There is no acute fracture or subluxation of the cervical spine.There is osteopenia with multilevel degenerative changes with bone spurring. C2-C4 anterior on bridging osteophyte noted.The odontoid and spinous processes are intact.There is normal anatomic alignment of the C1-C2 lateral masses. The visualized soft tissues appear unremarkable. IMPRESSION: No acute intracranial hemorrhage. No acute/traumatic cervical spine pathology. Electronically Signed   By: Anner Crete M.D.   On: 07/16/2016 22:37   Ct  Cervical Spine Wo Contrast  Result Date: 07/16/2016 CLINICAL DATA:  65 year old female with fall EXAM: CT HEAD WITHOUT CONTRAST CT CERVICAL SPINE WITHOUT CONTRAST TECHNIQUE: Multidetector CT imaging of the head and cervical spine was performed following the standard protocol without intravenous contrast. Multiplanar CT image reconstructions of the cervical spine were also generated. COMPARISON:  None. FINDINGS: CT HEAD FINDINGS The ventricles and sulci appropriate size for patient's age. Minimal periventricular and deep white matter chronic microvascular ischemic changes noted. There is no acute intracranial hemorrhage. No mass effect or midline shift noted. The visualized paranasal sinuses and mastoid air cells are clear. The calvarium is intact. CT CERVICAL SPINE FINDINGS There is  no acute fracture or subluxation of the cervical spine.There is osteopenia with multilevel degenerative changes with bone spurring. C2-C4 anterior on bridging osteophyte noted.The odontoid and spinous processes are intact.There is normal anatomic alignment of the C1-C2 lateral masses. The visualized soft tissues appear unremarkable. IMPRESSION: No acute intracranial hemorrhage. No acute/traumatic cervical spine pathology. Electronically Signed   By: Anner Crete M.D.   On: 07/16/2016 22:37   Dg Chest Port 1 View  Result Date: 07/17/2016 CLINICAL DATA:  Status post multiple falls, with generalized weakness and upper back pain. Right shoulder pain. Initial encounter. EXAM: PORTABLE CHEST 1 VIEW COMPARISON:  None. FINDINGS: The lungs are well-aerated. Minimal left basilar atelectasis is noted. There is no evidence of pleural effusion or pneumothorax. The cardiomediastinal silhouette is within normal limits. No acute osseous abnormalities are seen. Hardware is noted along the proximal right humerus, incompletely imaged on this study. IMPRESSION: No displaced rib fracture seen. Minimal left basilar atelectasis noted. Lungs otherwise  clear. Electronically Signed   By: Garald Balding M.D.   On: 07/17/2016 00:55     Assessment/Plan: 65 year old female with increasing tone and memory deficits.  Neurological examination is somewhat unusual for PD but can not rule out a Parkinson's variant which may or may not be responsive to Sinemet.  Can not rule out other possibilities such a metabolic abnormalities and stiff man syndrome.  Further work up recommended.  Will not repeat imaging at this time.  CT of the cervical spine personally reviewed and shws no cord abnormalities.    Recommendations: 1.  ESR, B12, B1, heavy metal screen 2.  EEG 3.  Will give a trial of  Sinemet 25/100 to start in AM.    Alexis Goodell, MD Neurology [phone] 07/18/2016, 1:32 PM

## 2016-07-18 NOTE — Progress Notes (Signed)
[facility] [place] NAME: Melissa Wise    MR#:  [id number]  DATE OF BIRTH:  [birth year]  SUBJECTIVE:   Patient here due to acute rhabdomyolysis, dehydration. Still having some significant rigidness and stiffness in her upper extremities more than the lower extremities.  REVIEW OF SYSTEMS:   Review of Systems  Constitutional: Negative for chills, fever and weight loss.  HENT: Negative for ear discharge, ear pain and nosebleeds.   Eyes: Negative for blurred vision, pain and discharge.  Respiratory: Negative for sputum production, shortness of breath, wheezing and stridor.   Cardiovascular: Negative for chest pain, palpitations, orthopnea and PND.  Gastrointestinal: Negative for abdominal pain, diarrhea, nausea and vomiting.  Genitourinary: Negative for frequency and urgency.  Musculoskeletal: Positive for falls and myalgias. Negative for back pain and joint pain.  Neurological: Positive for weakness. Negative for sensory change, speech change and focal weakness.  Psychiatric/Behavioral: Negative for depression and hallucinations. The patient is not nervous/anxious.    Tolerating Diet: Heart Healthy Tolerating PT:  Await Eval.   DRUG ALLERGIES:   Allergies  Allergen Reactions  . Amlodipine Swelling  . Enalapril Cough    VITALS:  Blood pressure 99/64, pulse 73, temperature 98.1 F (36.7 C), temperature source Axillary, resp. rate 18, height 5\' 8"  (1.727 m), weight 64.5 kg (142 lb 4.8 oz), SpO2 99 %.  PHYSICAL EXAMINATION:   Physical Exam  GENERAL:  65 y.o.-year-old patient lying in the bed in no acute distress.  EYES: Pupils equal, round, reactive to light and accommodation. No scleral icterus. Extraocular muscles intact.  HEENT: Head atraumatic, normocephalic. Oropharynx and nasopharynx clear.  NECK:  Supple, no jugular venous distention. No thyroid enlargement, no tenderness.  LUNGS: Normal breath sounds bilaterally, no  wheezing, rales, rhonchi. No use of accessory muscles of respiration.  CARDIOVASCULAR: S1, S2 normal. No murmurs, rubs, or gallops.  ABDOMEN: Soft, nontender, nondistended. Bowel sounds present. No organomegaly or mass.  EXTREMITIES: No cyanosis, clubbing or edema b/l.    NEUROLOGIC: Cranial nerves II through XII are intact. No focal Motor or sensory deficits b/l.  Subjective weakness PSYCHIATRIC:  patient is alert and oriented x 3.  SKIN: No obvious rash, lesion, or ulcer.  Bruising noted on b/l knees with abrasions.   LABORATORY PANEL:  CBC  Recent Labs Lab 07/16/16 2231  WBC 17.7*  HGB 14.3  HCT 40.6  PLT 208    Chemistries   Recent Labs Lab 07/16/16 2231 07/18/16 0535  NA 127* 134*  K 3.7  --   CL 91*  --   CO2 30  --   GLUCOSE 117*  --   BUN 12  --   CREATININE 0.54  --   CALCIUM 9.2  --    Cardiac Enzymes  Recent Labs Lab 07/16/16 2231  TROPONINI 0.03*   RADIOLOGY:  Ct Head Wo Contrast  Result Date: 07/16/2016 CLINICAL DATA:  65 year old female with fall EXAM: CT HEAD WITHOUT CONTRAST CT CERVICAL SPINE WITHOUT CONTRAST TECHNIQUE: Multidetector CT imaging of the head and cervical spine was performed following the standard protocol without intravenous contrast. Multiplanar CT image reconstructions of the cervical spine were also generated. COMPARISON:  None. FINDINGS: CT HEAD FINDINGS The ventricles and sulci appropriate size for patient's age. Minimal periventricular and deep white matter chronic microvascular ischemic changes noted. There is no acute intracranial hemorrhage. No mass effect or midline shift noted. The visualized paranasal sinuses and mastoid air cells are clear. The calvarium is intact.  CT CERVICAL SPINE FINDINGS There is no acute fracture or subluxation of the cervical spine.There is osteopenia with multilevel degenerative changes with bone spurring. C2-C4 anterior on bridging osteophyte noted.The odontoid and spinous processes are intact.There is  normal anatomic alignment of the C1-C2 lateral masses. The visualized soft tissues appear unremarkable. IMPRESSION: No acute intracranial hemorrhage. No acute/traumatic cervical spine pathology. Electronically Signed   By: Anner Crete M.D.   On: 07/16/2016 22:37   Ct Cervical Spine Wo Contrast  Result Date: 07/16/2016 CLINICAL DATA:  66 year old female with fall EXAM: CT HEAD WITHOUT CONTRAST CT CERVICAL SPINE WITHOUT CONTRAST TECHNIQUE: Multidetector CT imaging of the head and cervical spine was performed following the standard protocol without intravenous contrast. Multiplanar CT image reconstructions of the cervical spine were also generated. COMPARISON:  None. FINDINGS: CT HEAD FINDINGS The ventricles and sulci appropriate size for patient's age. Minimal periventricular and deep white matter chronic microvascular ischemic changes noted. There is no acute intracranial hemorrhage. No mass effect or midline shift noted. The visualized paranasal sinuses and mastoid air cells are clear. The calvarium is intact. CT CERVICAL SPINE FINDINGS There is no acute fracture or subluxation of the cervical spine.There is osteopenia with multilevel degenerative changes with bone spurring. C2-C4 anterior on bridging osteophyte noted.The odontoid and spinous processes are intact.There is normal anatomic alignment of the C1-C2 lateral masses. The visualized soft tissues appear unremarkable. IMPRESSION: No acute intracranial hemorrhage. No acute/traumatic cervical spine pathology. Electronically Signed   By: Anner Crete M.D.   On: 07/16/2016 22:37   Dg Chest Port 1 View  Result Date: 07/17/2016 CLINICAL DATA:  Status post multiple falls, with generalized weakness and upper back pain. Right shoulder pain. Initial encounter. EXAM: PORTABLE CHEST 1 VIEW COMPARISON:  None. FINDINGS: The lungs are well-aerated. Minimal left basilar atelectasis is noted. There is no evidence of pleural effusion or pneumothorax. The  cardiomediastinal silhouette is within normal limits. No acute osseous abnormalities are seen. Hardware is noted along the proximal right humerus, incompletely imaged on this study. IMPRESSION: No displaced rib fracture seen. Minimal left basilar atelectasis noted. Lungs otherwise clear. Electronically Signed   By: Garald Balding M.D.   On: 07/17/2016 00:55   ASSESSMENT AND PLAN:  65 year old female admitted for rhabdomyolysis.  1. Acute mild Rhabdomyolysis: Traumatic due to falls.  - CK's have trended down with IV fluids and improving and will monitor.   - Cr. Stable.   2. Motor slowing: Associated with masklike facies, mild dysphagia and cognitive rigidity consistent with suspected Parkinson's. - discussed w/ Neurology and they will start a work up.  - await PT Eval.   3. Hypertension: normotensive. Hold HCTZ due to hyponatremia.   - hold Cozaar.   4. Hyponatremia - due to dehydration.  - Improved w/ IV fluids and will monitor.  - hold HCTZ   5. Leukocytosis - likely stress mediated. No infectious source and will monitor.  Case discussed with Care Management/Social Worker. Management plans discussed with the patient, family and they are in agreement.  CODE STATUS: FULL  TOTAL TIME TAKING CARE OF THIS PATIENT: 25 minutes.   POSSIBLE D/C IN 1-2 DAYS, DEPENDING ON CLINICAL CONDITION.  Note: This dictation was prepared with Dragon dictation along with smaller phrase technology. Any transcriptional errors that result from this process are unintentional.  Henreitta Leber M.D on 07/18/2016 at 12:12 PM  Between 7am to 6pm - Pager - [phone]  After 6pm go to [email or web address] - password [email or web address] [facility]  Office  [phone]  CC: Primary care physician; Park Liter, DO

## 2016-07-19 ENCOUNTER — Inpatient Hospital Stay (HOSPITAL_COMMUNITY): Payer: PPO

## 2016-07-19 DIAGNOSIS — I10 Essential (primary) hypertension: Secondary | ICD-10-CM | POA: Diagnosis not present

## 2016-07-19 DIAGNOSIS — M6282 Rhabdomyolysis: Secondary | ICD-10-CM | POA: Diagnosis not present

## 2016-07-19 DIAGNOSIS — F05 Delirium due to known physiological condition: Secondary | ICD-10-CM | POA: Diagnosis not present

## 2016-07-19 DIAGNOSIS — I872 Venous insufficiency (chronic) (peripheral): Secondary | ICD-10-CM | POA: Diagnosis not present

## 2016-07-19 DIAGNOSIS — W19XXXA Unspecified fall, initial encounter: Secondary | ICD-10-CM | POA: Diagnosis not present

## 2016-07-19 DIAGNOSIS — R259 Unspecified abnormal involuntary movements: Secondary | ICD-10-CM

## 2016-07-19 DIAGNOSIS — E119 Type 2 diabetes mellitus without complications: Secondary | ICD-10-CM | POA: Diagnosis not present

## 2016-07-19 DIAGNOSIS — R531 Weakness: Secondary | ICD-10-CM | POA: Diagnosis not present

## 2016-07-19 DIAGNOSIS — G20 Parkinson's disease: Secondary | ICD-10-CM | POA: Diagnosis not present

## 2016-07-19 DIAGNOSIS — Z7401 Bed confinement status: Secondary | ICD-10-CM | POA: Diagnosis not present

## 2016-07-19 DIAGNOSIS — G4700 Insomnia, unspecified: Secondary | ICD-10-CM | POA: Diagnosis not present

## 2016-07-19 DIAGNOSIS — T796XXD Traumatic ischemia of muscle, subsequent encounter: Secondary | ICD-10-CM

## 2016-07-19 DIAGNOSIS — T796XXA Traumatic ischemia of muscle, initial encounter: Secondary | ICD-10-CM | POA: Diagnosis not present

## 2016-07-19 DIAGNOSIS — M545 Low back pain: Secondary | ICD-10-CM | POA: Diagnosis not present

## 2016-07-19 DIAGNOSIS — K219 Gastro-esophageal reflux disease without esophagitis: Secondary | ICD-10-CM | POA: Diagnosis not present

## 2016-07-19 DIAGNOSIS — R6889 Other general symptoms and signs: Secondary | ICD-10-CM | POA: Diagnosis not present

## 2016-07-19 DIAGNOSIS — R269 Unspecified abnormalities of gait and mobility: Secondary | ICD-10-CM | POA: Diagnosis not present

## 2016-07-19 DIAGNOSIS — E871 Hypo-osmolality and hyponatremia: Secondary | ICD-10-CM | POA: Diagnosis not present

## 2016-07-19 DIAGNOSIS — E785 Hyperlipidemia, unspecified: Secondary | ICD-10-CM | POA: Diagnosis not present

## 2016-07-19 DIAGNOSIS — W19XXXD Unspecified fall, subsequent encounter: Secondary | ICD-10-CM | POA: Diagnosis not present

## 2016-07-19 DIAGNOSIS — E612 Magnesium deficiency: Secondary | ICD-10-CM | POA: Diagnosis not present

## 2016-07-19 LAB — HEAVY METALS, BLOOD
Arsenic: 7 ug/L (ref 2–23)
LEAD: 1 ug/dL (ref 0–19)
MERCURY: NOT DETECTED ug/L (ref 0.0–14.9)

## 2016-07-19 LAB — BASIC METABOLIC PANEL
ANION GAP: 5 (ref 5–15)
BUN: 9 mg/dL (ref 6–20)
CALCIUM: 8.3 mg/dL — AB (ref 8.9–10.3)
CHLORIDE: 103 mmol/L (ref 101–111)
CO2: 28 mmol/L (ref 22–32)
CREATININE: 0.49 mg/dL (ref 0.44–1.00)
GLUCOSE: 105 mg/dL — AB (ref 65–99)
POTASSIUM: 3.4 mmol/L — AB (ref 3.5–5.1)
SODIUM: 136 mmol/L (ref 135–145)

## 2016-07-19 LAB — CBC
HCT: 36.4 % (ref 35.0–47.0)
HEMOGLOBIN: 12.7 g/dL (ref 12.0–16.0)
MCH: 30.1 pg (ref 26.0–34.0)
MCHC: 34.8 g/dL (ref 32.0–36.0)
MCV: 86.4 fL (ref 80.0–100.0)
PLATELETS: 171 10*3/uL (ref 150–440)
RBC: 4.22 MIL/uL (ref 3.80–5.20)
RDW: 13.4 % (ref 11.5–14.5)
WBC: 7.7 10*3/uL (ref 3.6–11.0)

## 2016-07-19 LAB — CK: Total CK: 312 U/L — ABNORMAL HIGH (ref 38–234)

## 2016-07-19 MED ORDER — CARBIDOPA-LEVODOPA 25-100 MG PO TABS: 1.0000 | ORAL_TABLET | Freq: Two times a day (BID) | ORAL | Status: DC

## 2016-07-19 MED ORDER — LOSARTAN POTASSIUM 25 MG PO TABS: 25.0000 mg | ORAL_TABLET | Freq: Every day | ORAL | Status: DC

## 2016-07-19 NOTE — Progress Notes (Signed)
Subjective: Patient s/p one dose of Sinemet this morning.  Tolerating well.    Objective: Current vital signs: BP 123/66 (BP Location: Left Arm)   Pulse 85   Temp 99.1 F (37.3 C)   Resp 17   Ht _0  (1.727 m)   Wt 64.3 kg (141 lb 12.8 oz)   SpO2 100%   BMI 21.56 kg/m  Vital signs in last 24 hours: Temp:  [98 F (36.7 C)-99.1 F (37.3 C)] 99.1 F (37.3 C) (09/11 1027) Pulse Rate:  [79-85] 85 (09/11 1027) Resp:  [17-18] 17 (09/11 1027) BP: (117-151)/(66-77) 123/66 (09/11 1027) SpO2:  [100 %] 100 % (09/11 1027) Weight:  [64.3 kg (141 lb 12.8 oz)] 64.3 kg (141 lb 12.8 oz) (09/11 0454)  Intake/Output from previous day: 09/10 0701 - 09/11 0700 In: 243 [P.O.:240; I.V.:3] Out: 2375 [Urine:2375] Intake/Output this shift: Total I/O In: 240 [P.O.:240] Out: 200 [Urine:200] Nutritional status: Diet Heart Room service appropriate? Yes; Fluid consistency: Thin  Neurologic Exam: Mental Status: Alert, oriented, thought content appropriate.  Speech fluent without evidence of aphasia.  Face more animated.   Cranial Nerves: II: Discs flat bilaterally; Visual fields grossly normal, pupils equal, round, reactive to light and accommodation III,IV, VI: ptosis not present, extra-ocular motions intact bilaterally V,VII: smile symmetric, facial light touch sensation normal bilaterally VIII: hearing normal bilaterally IX,X: gag reflex present XI: bilateral shoulder shrug XII: midline tongue extension Motor: 4/5 throughout with increased tone and bradykinesia.  Unable to extend arms and legs out fully as if mildly contracted. Sensory: Pinprick and light touch intact throughout, bilaterally Deep Tendon Reflexes: 2+ in the upper extremities, 1+ at the knees and absent at the ankles.   Plantars: Right: equivocal                                                                Left: equivocal Cerebellar: Normal finger-to-nose and normal heel-to-shin testing bilaterally.  Improved fine motor  movements.     Lab Results: Basic Metabolic Panel:  Recent Labs Lab 07/16/16 2231 07/18/16 0535 07/19/16 0325  NA 127* 134* 136  K 3.7  --  3.4*  CL 91*  --  103  CO2 30  --  28  GLUCOSE 117*  --  105*  BUN 12  --  9  CREATININE 0.54  --  0.49  CALCIUM 9.2  --  8.3*    Liver Function Tests: No results for input(s): AST, ALT, ALKPHOS, BILITOT, PROT, ALBUMIN in the last 168 hours. No results for input(s): LIPASE, AMYLASE in the last 168 hours. No results for input(s): AMMONIA in the last 168 hours.  CBC:  Recent Labs Lab 07/16/16 2231 07/19/16 0325  WBC 17.7* 7.7  HGB 14.3 12.7  HCT 40.6 36.4  MCV 85.0 86.4  PLT 208 171    Cardiac Enzymes:  Recent Labs Lab 07/16/16 2231 07/18/16 0535 07/19/16 0325  CKTOTAL 1,345* 490* 312*  TROPONINI 0.03*  --   --     Lipid Panel: No results for input(s): CHOL, TRIG, HDL, CHOLHDL, VLDL, LDLCALC in the last 168 hours.  CBG:  Recent Labs Lab 07/16/16 2232  GLUCAP 113*    Microbiology: Results for orders placed or performed in visit on 05/04/16  Microscopic Examination     Status:  None   Collection Time: 05/04/16  3:25 PM  Result Value Ref Range Status   WBC, UA 0-5 0 - 5 /hpf Final   RBC, UA 0-2 0 - 2 /hpf Final   Epithelial Cells (non renal) None seen 0 - 10 /hpf Final   Bacteria, UA Few None seen/Few Final    Coagulation Studies: No results for input(s): LABPROT, INR in the last 72 hours.  Imaging: No results found.  Medications:  I have reviewed the patient's current medications. Scheduled: . B-complex with vitamin C  1 tablet Oral Daily  . carbidopa-levodopa  1 tablet Oral BID  . cholecalciferol  1,000 Units Oral Daily  . docusate sodium  100 mg Oral BID  . enoxaparin (LOVENOX) injection  40 mg Subcutaneous Q24H  . multivitamin with minerals  1 tablet Oral Daily  . sodium chloride flush  3 mL Intravenous Q12H    Assessment/Plan: Patient may be experiencing mild improvement form the Sinemet.   Appears to be tolerating well.  EEG pending.  B12 414.  ESR 6.  Remaining lab work pending.    Recommendations: 1.  Continue Sinemet CR 25/100 twice a day 2.  Follow up with neurology as an outpatient   LOS: 2 days   Alexis Goodell, MD Neurology [phone] 07/19/2016  12:09 PM

## 2016-07-19 NOTE — Care Management Important Message (Signed)
Important Message  Patient Details  Name: Anaiya Noto MRN: [id number] Date of Birth: 1951/02/27   Medicare Important Message Given:  Yes    Jolly Mango, RN 07/19/2016, 9:00 AM

## 2016-07-19 NOTE — NC FL2 (Signed)
[place] LEVEL OF CARE SCREENING TOOL     IDENTIFICATION  Patient Name: Melissa Wise Birthdate: Jan 16, 1951 Sex: female Admission Date (Current Location): 07/16/2016  [place] and [place] Number:  Engineering geologist and Address:  [facility], [address] [place], [place] [address]      Provider Number: [id number]  Attending Physician Name and Address:  Henreitta Leber, MD  Relative Name and Phone Number:       Current Level of Care: Hospital Recommended Level of Care: [place] Prior Approval Number:    Date Approved/Denied:   PASRR Number:  ([id number])  Discharge Plan: SNF    Current Diagnoses: Patient Active Problem List   Diagnosis Date Noted  . Rhabdomyolysis 07/17/2016  . Ataxia   . Hypertension 02/04/2016  . Hyperlipidemia 02/04/2016    Orientation RESPIRATION BLADDER Height & Weight     Self, Time, Situation, Place  Normal Continent Weight: 141 lb 12.8 oz (64.3 kg) Height:  5\' 8"  (172.7 cm)  BEHAVIORAL SYMPTOMS/MOOD NEUROLOGICAL BOWEL NUTRITION STATUS   (none)  (none) Continent Diet (Diet: Heart Healthy )  AMBULATORY STATUS COMMUNICATION OF NEEDS Skin   Extensive Assist Verbally Normal                       Personal Care Assistance Level of Assistance  Bathing, Feeding, Dressing Bathing Assistance: Limited assistance Feeding assistance: Independent Dressing Assistance: Limited assistance     Functional Limitations Info  Sight, Hearing, Speech Sight Info: Adequate Hearing Info: Adequate Speech Info: Adequate    SPECIAL CARE FACTORS FREQUENCY  PT (By licensed PT), OT (By licensed OT)     PT Frequency:  (5) OT Frequency:  (5)            Contractures      Additional Factors Info  Code Status, Allergies Code Status Info:  (Full Code. ) Allergies Info:  (Amlodipine, Enalapril)           Current Medications (07/19/2016):  This is the current hospital active medication  list Current Facility-Administered Medications  Medication Dose Route Frequency Provider Last Rate Last Dose  . acetaminophen (TYLENOL) tablet 650 mg  650 mg Oral Q6H PRN Harrie Foreman, MD   650 mg at 07/17/16 0240   Or  . acetaminophen (TYLENOL) suppository 650 mg  650 mg Rectal Q6H PRN Harrie Foreman, MD      . B-complex with vitamin C tablet 1 tablet  1 tablet Oral Daily Harrie Foreman, MD   1 tablet at 07/18/16 0932  . carbidopa-levodopa (SINEMET IR) 25-100 MG per tablet immediate release 1 tablet  1 tablet Oral BID Alexis Goodell, MD      . cholecalciferol (VITAMIN D) tablet 1,000 Units  1,000 Units Oral Daily Harrie Foreman, MD   1,000 Units at 07/18/16 0932  . docusate sodium (COLACE) capsule 100 mg  100 mg Oral BID Harrie Foreman, MD   100 mg at 07/18/16 0932  . enoxaparin (LOVENOX) injection 40 mg  40 mg Subcutaneous Q24H Harrie Foreman, MD   40 mg at 07/18/16 2108  . multivitamin with minerals tablet 1 tablet  1 tablet Oral Daily Harrie Foreman, MD   1 tablet at 07/18/16 0932  . ondansetron (ZOFRAN) tablet 4 mg  4 mg Oral Q6H PRN Harrie Foreman, MD       Or  . ondansetron [facility]) injection 4 mg  4 mg Intravenous Q6H PRN  Harrie Foreman, MD      . sodium chloride flush (NS) 0.9 % injection 3 mL  3 mL Intravenous Q12H Harrie Foreman, MD   3 mL at 07/18/16 2108     Discharge Medications: Please see discharge summary for a list of discharge medications.  Relevant Imaging Results:  Relevant Lab Results:   Additional Information  SSN: [id number]  Loyalty Brashier, Veronia Beets, LCSW

## 2016-07-19 NOTE — Clinical Social Work Placement (Signed)
   CLINICAL SOCIAL WORK PLACEMENT  NOTE  Date:  07/19/2016  Patient Details  Name: Melissa Wise MRN: [id number] Date of Birth: 07-27-1951  Clinical Social Work is seeking post-discharge placement for this patient at the [place] level of care (*CSW will initial, date and re-position this form in  chart as items are completed):  Yes   Patient/family provided with [place] Work Department's list of facilities offering this level of care within the geographic area requested by the patient (or if unable, by the patient's family).  Yes   Patient/family informed of their freedom to choose among providers that offer the needed level of care, that participate in Medicare, Medicaid or managed care program needed by the patient, have an available bed and are willing to accept the patient.  Yes   Patient/family informed of [facility]'s ownership interest in [facility] and [facility], as well as of the fact that they are under no obligation to receive care at these facilities.  PASRR submitted to EDS on 07/19/16     PASRR number received on 07/19/16     Existing PASRR number confirmed on       FL2 transmitted to all facilities in geographic area requested by pt/family on 07/19/16     FL2 transmitted to all facilities within larger geographic area on       Patient informed that his/her managed care company has contracts with or will negotiate with certain facilities, including the following:        Yes   Patient/family informed of bed offers received.  Patient chooses bed at  [facility] )     Physician recommends and patient chooses bed at      Patient to be transferred to  [facility] ) on 07/19/16.  Patient to be transferred to facility by  [facility] )     Patient family notified on 07/19/16 of transfer.  Name of family member notified:   (Patient's brother in law Ruby Cola is aware of D/C today. )     PHYSICIAN        Additional Comment:    _______________________________________________ Zadrian Mccauley, Veronia Beets, LCSW 07/19/2016, 4:42 PM

## 2016-07-19 NOTE — Clinical Social Work Note (Signed)
Clinical Social Work Assessment  Patient Details  Name: Melissa Wise MRN: [id number] Date of Birth: 07/13/1951  Date of referral:  07/19/16               Reason for consult:  Facility Placement                Permission sought to share information with:  Facility Contact Representative Permission granted to share information::  Yes, Verbal Permission Granted  Name::      [facility]  Agency::   Skilled Nursing Facility   Relationship::     Contact Information:     Housing/Transportation Living arrangements for the past 2 months:  Single Family Home Source of Information:  Patient Patient Interpreter Needed:  None Criminal Activity/Legal Involvement Pertinent to Current Situation/Hospitalization:  No - Comment as needed Significant Relationships:  Siblings Lives with:  Self Do you feel safe going back to the place where you live?  Yes Need for family participation in patient care:  Yes (Comment)  Care giving concerns:  Patient lives alone in [place].    Social Worker assessment / plan:  Clinical Social Worker (CSW) received verbal consult from PT that recommendation is SNF. CSW met with patient alone at bedside to discuss D/C plan. Patient reported that she lives alone and her sister is her primary support. Per patient her sister is out of town and her brother in law will be assisting her. CSW explained SNF process and that patient's Health Team requires authorization. Patient is agreeable to SNF search. CSW presented bed offers. Patient chose [facility].   Patient is medically stable for D/C to [facility] today. Per Doug admissions coordinator at [facility] patient will go to room 405. RN will call report to 400 hall RN and arrange EMS for transport. RN has agreed to call patient's brother in law Brent [phone] when EMS arrives. Health Team authorization has been received. Auth # [id number]. CSW sent D/C orders to Doug via H[facility]. Patient is aware of above. CSW  contacted patient's brother in law Brent and made him aware of above. Please reconsult if future social work needs arise. CSW signing off.   Employment status:  Retired Insurance information:  Managed Medicare PT Recommendations:  Skilled Nursing Facility Information / Referral to community resources:  Skilled Nursing Facility  Patient/Family's Response to care:  Patient is agreeable to go to [facility] today.   Patient/Family's Understanding of and Emotional Response to Diagnosis, Current Treatment, and Prognosis:  Patient was very pleasant and thanked CSW for visit.   Emotional Assessment Appearance:  Appears stated age Attitude/Demeanor/Rapport:    Affect (typically observed):  Accepting, Adaptable, Pleasant Orientation:  Oriented to Self, Oriented to Place, Oriented to  Time, Oriented to Situation Alcohol / Substance use:  Not Applicable Psych involvement (Current and /or in the community):  No (Comment)  Discharge Needs  Concerns to be addressed:  Discharge Planning Concerns Readmission within the last 30 days:  No Current discharge risk:  None Barriers to Discharge:  No Barriers Identified   ,  M, LCSW 07/19/2016, 4:45 PM  

## 2016-07-19 NOTE — Evaluation (Signed)
Physical Therapy Evaluation Patient Details Name: Melissa Wise MRN: [id number] DOB: 1951/10/20 Today's Date: 07/19/2016   History of Present Illness  The patient with past medical history of hypertension presents to the emergency department after being found on the floor of her home. She denies loss of consciousness, chest pain, dizziness, nausea, vomiting or diarrhea. The patient states that she has fallen at least 4 times today the last which made her too weak to pick herself up. She reports that she did on the ground for at least 2 hours by the time she was able to reach help. She is transferred to the emergency department where she was found to have elevated creatinine kinase and generalized stiffness. The patient reports that she has some change to her voice. She also admits that she's been progressively weaker over 6 months and has been falling very frequently lately. Her primary care doctor had referred her to neurology but this appointment is not scheduled until next month. Due to generalized weakness, motor slowing and rhabdomyolysis emergency department staff called the hospitalist service for admission. Pt sufferred 4 falls last Friday. Denies other falls in the past 12 months.   Clinical Impression  Pt admitted with above diagnosis. Pt currently with functional limitations due to the deficits listed below (see PT Problem List).  Pt does not demonstrate good insight and judgment and it is unclear the accuracy of her history. Her conversation is often tangential and unrelated to the subject matter at hand. Pt struggles with transfers requiring assist to prevent posterior falls. She demonstrates crouched gait posture failing to fully extend knees or dorsiflex ankles. She walks with short, shuffling steps in festinating pattern. No arm swing or trunk rotation noted during ambulation and poor visual scanning of environment. No freezing episodes noted during gait. During turns she is very unsafe and  demonstrates uncontrolled descent onto chair requiring therapist assist to prevent her from missing seat. Pt is very flat in her affect and occasionally speaks softly. UE/LE ridigity noted but no tremor observed. Pt unable to fully supinate forearms or extend elbows. Pt will need SNF placement at discharge due to weakness, frequent falls, and inability to demonstrate safe insight/judgement currently. Pt will benefit from skilled PT services to address deficits in strength, balance, and mobility in order to return to full function at home.     Follow Up Recommendations SNF    Equipment Recommendations  Rolling walker with 5" wheels    Recommendations for Other Services       Precautions / Restrictions Precautions Precautions: Fall Restrictions Weight Bearing Restrictions: No      Mobility  Bed Mobility               General bed mobility comments: Received upright in recliner and left in recliner  Transfers Overall transfer level: Needs assistance Equipment used: Rolling walker (2 wheeled) Transfers: Sit to/from Stand Sit to Stand: Min assist         General transfer comment: Pt with poor initiation of movement and sequencing. Unabe to demonstrate adequate anterior weight shifting in order to come to standing without assistance. Poor balance initially upon standing with posterior LOB  Ambulation/Gait Ambulation/Gait assistance: Min assist Ambulation Distance (Feet): 75 Feet Assistive device: Rolling walker (2 wheeled) Gait Pattern/deviations: Decreased step length - right;Decreased step length - left;Decreased dorsiflexion - right;Decreased dorsiflexion - left;Shuffle;Festinating Gait velocity: Decreased Gait velocity interpretation: <1.8 ft/sec, indicative of risk for recurrent falls General Gait Details: Pt demonstrates crouched gait posture failing to fully  extend knees or dorsiflex ankles. She walks with short, shuffling steps in festinating pattern. No arm swing or  trunk rotation noted during ambulation and poor visual scanning of environment. No freezing episodes noted during gait. Pt able to ambulate short distance without UE assist. However with turns she is very unsafe and demonstrates uncontrolled descent onto chair requiring therapist assist to prevent her from missing seat  Stairs            Wheelchair Mobility    Modified Rankin (Stroke Patients Only)       Balance Overall balance assessment: Needs assistance Sitting-balance support: No upper extremity supported Sitting balance-Leahy Scale: Good     Standing balance support: No upper extremity supported Standing balance-Leahy Scale: Fair Standing balance comment: Fair balance in wide stance. Unable to achieve narrow stance without UE support                             Pertinent Vitals/Pain Pain Assessment: 0-10 Pain Score: 3  Pain Location: Low back s/p fall. Pt reports some L knee pain as well due to fall but will not rate Pain Intervention(s): Monitored during session    Home Living Family/patient expects to be discharged to:: Private residence Living Arrangements: Alone Available Help at Discharge: Family Type of Home: House Home Access: Stairs to enter Entrance Stairs-Rails: Can reach both;Right;Left Entrance Stairs-Number of Steps: 2 Home Layout: One level Home Equipment: Grab bars - tub/shower (no assistive devices, no BSC, no wc)      Prior Function Level of Independence: Independent         Comments: Pt reports she was a full community ambulator without assistive device prior to admission. States she was driving. Independent with ADLs/IADLs     Hand Dominance   Dominant Hand: Right    Extremity/Trunk Assessment   Upper Extremity Assessment: Overall WFL for tasks assessed (Limited shoulder flexion AROM, increased rigidity)           Lower Extremity Assessment: Generalized weakness         Communication   Communication: No  difficulties  Cognition Arousal/Alertness: Awake/alert Behavior During Therapy: WFL for tasks assessed/performed Overall Cognitive Status: Within Functional Limits for tasks assessed                      General Comments      Exercises General Exercises - Lower Extremity Long Arc Quad: Strengthening;Both;15 reps;Seated Heel Slides: Strengthening;Both;15 reps;Seated Hip ABduction/ADduction: Strengthening;Both;15 reps;Seated Hip Flexion/Marching: Strengthening;Both;15 reps;Seated Heel Raises: Strengthening;Both;15 reps;Seated      Assessment/Plan    PT Assessment Patient needs continued PT services  PT Diagnosis Difficulty walking;Abnormality of gait;Generalized weakness   PT Problem List Decreased strength;Decreased balance;Decreased mobility;Decreased safety awareness;Impaired tone  PT Treatment Interventions DME instruction;Gait training;Stair training;Functional mobility training;Therapeutic activities;Therapeutic exercise;Balance training;Neuromuscular re-education;Cognitive remediation;Patient/family education   PT Goals (Current goals can be found in the Care Plan section) Acute Rehab PT Goals Patient Stated Goal: Improve function and find out what is causing her symptoms PT Goal Formulation: With patient Time For Goal Achievement: 08/02/16 Potential to Achieve Goals: Fair    Frequency Min 2X/week   Barriers to discharge Decreased caregiver support Pt states she lives alone, has no family/friends who can stay with her    Co-evaluation               End of Session Equipment Utilized During Treatment: Gait belt Activity Tolerance: Patient tolerated treatment well Patient  left: in chair;with call bell/phone within reach;with chair alarm set Nurse Communication: Mobility status;Other (comment) (Communicated to MD)         Time: [id number] PT Time Calculation (min) (ACUTE ONLY): 24 min   Charges:   PT Evaluation $PT Eval Moderate Complexity: 1  Procedure PT Treatments $Gait Training: 8-22 mins   PT G Codes:       Lyndel Safe Caesar Mannella PT, DPT   Ashleymarie Granderson 07/19/2016, 10:18 AM

## 2016-07-19 NOTE — Discharge Summary (Signed)
[facility] NAME: Melissa Wise    MR#:  [id number]  DATE OF BIRTH:  04/27/1951  DATE OF ADMISSION:  07/16/2016 ADMITTING PHYSICIAN: Harrie Foreman, MD  DATE OF DISCHARGE: 07/19/2016  PRIMARY CARE PHYSICIAN: Park Liter, DO    ADMISSION DIAGNOSIS:  Gait abnormality [R26.9] Hyponatremia [E87.1] Weakness [R53.1] Fall, initial encounter [W19.XXXA] Traumatic rhabdomyolysis, initial encounter ([facility]) [T79.6XXA]  DISCHARGE DIAGNOSIS:  Active Problems:   Rhabdomyolysis   SECONDARY DIAGNOSIS:   Past Medical History:  Diagnosis Date  . Allergy   . Ataxia   . GERD (gastroesophageal reflux disease)   . Hyperglycemia   . Hyperlipidemia   . Hypertension   . Insomnia   . Low back pain     HOSPITAL COURSE:   65 year old female With history of GERD, HTN, Hyperlipidemia, Hyperglycemia, admitted for rhabdomyolysis.  1. Acute mild Rhabdomyolysis: Traumatic due to her fall. -Patient was admitted to the hospital given aggressive IV fluids and his CKs have trended down and improved. This has resolved now. Patient's renal function has remained stable.   2. Motor slowing: Associated with masklike facies, mild dysphagia and cognitive rigidity consistent with suspected Parkinson's. -Patient was seen by neurology and they recommended starting the patient on low-dose Sinemet. She will be discharged on that with follow-up with neurology next 2-3 weeks as an outpatient. -She was seen by physical therapy who recommended short-term rehabilitation which is where she is being discharged presently.  3. Hypertension: Given her hyponatremia she was taken off the HCTZ. -She will resume her losartan.  4. Hyponatremia - due to dehydration.  -This has improved and resolved with IV fluids. She has been taken off the HCTZ.   5. Leukocytosis - likely stress mediated. No acute infectious source, white cell count has now normalized.  DISCHARGE  CONDITIONS:   Stable  CONSULTS OBTAINED:  Treatment Team:  Alexis Goodell, MD  DRUG ALLERGIES:   Allergies  Allergen Reactions  . Amlodipine Swelling  . Enalapril Cough    DISCHARGE MEDICATIONS:     Medication List    STOP taking these medications   hydrochlorothiazide 25 MG tablet Commonly known as:  HYDRODIURIL     TAKE these medications   b complex vitamins capsule Take 1 capsule by mouth daily.   carbidopa-levodopa 25-100 MG tablet Commonly known as:  SINEMET IR Take 1 tablet by mouth 2 (two) times daily. To be given at 8 a.m. And 2 p.m.   COCONUT OIL PO Take by mouth.   Fish Oil 1000 MG Caps Take 300 mg by mouth.   Kelp 0.15 MG Tabs Take by mouth.   losartan 25 MG tablet Commonly known as:  COZAAR Take 1 tablet (25 mg total) by mouth daily. What changed:  medication strength  how much to take   lovastatin 40 MG tablet Commonly known as:  MEVACOR Take 1 tablet (40 mg total) by mouth daily. 1 and 1/2 tablets with dinner   Magnesium 400 MG Caps Take by mouth.   multivitamin tablet Take 1 tablet by mouth daily.   OVER THE COUNTER MEDICATION VitaFusion Fiber Well, sugar free gummies with digestive health, regularity support and probiotic   UNABLE TO FIND 500 mg. tumeric   Vitamin D (Cholecalciferol) 1000 units Caps Take 1,000 Units by mouth.         DISCHARGE INSTRUCTIONS:   DIET:  Cardiac diet  DISCHARGE CONDITION:  Stable  ACTIVITY:  Activity as tolerated  OXYGEN:  Home Oxygen:  No.   Oxygen Delivery: room air  DISCHARGE LOCATION:  nursing home   If you experience worsening of your admission symptoms, develop shortness of breath, life threatening emergency, suicidal or homicidal thoughts you must seek medical attention immediately by calling 911 or calling your MD immediately  if symptoms less severe.  You Must read complete instructions/literature along with all the possible adverse reactions/side effects for all the  Medicines you take and that have been prescribed to you. Take any new Medicines after you have completely understood and accpet all the possible adverse reactions/side effects.   Please note  You were cared for by a hospitalist during your hospital stay. If you have any questions about your discharge medications or the care you received while you were in the hospital after you are discharged, you can call the unit and asked to speak with the hospitalist on call if the hospitalist that took care of you is not available. Once you are discharged, your primary care physician will handle any further medical issues. Please note that NO REFILLS for any discharge medications will be authorized once you are discharged, as it is imperative that you return to your primary care physician (or establish a relationship with a primary care physician if you do not have one) for your aftercare needs so that they can reassess your need for medications and monitor your lab values.     Today   CK's normalized. Sodium improved.  Sitting up in chair and has no complaints.  No acute events overnight.  Feels better.  Worked with PT earlier today.   VITAL SIGNS:  Blood pressure 123/66, pulse 85, temperature 99.1 F (37.3 C), resp. rate 17, height 5\' 8"  (1.727 m), weight 64.3 kg (141 lb 12.8 oz), SpO2 100 %.  I/O:    Intake/Output Summary (Last 24 hours) at 07/19/16 1219 Last data filed at 07/19/16 1011  Gross per 24 hour  Intake              483 ml  Output             1625 ml  Net            -1142 ml    PHYSICAL EXAMINATION:   GENERAL:  65 y.o.-year-old patient lying in the bed in no acute distress.  EYES: Pupils equal, round, reactive to light and accommodation. No scleral icterus. Extraocular muscles intact.  HEENT: Head atraumatic, normocephalic. Oropharynx and nasopharynx clear.  NECK:  Supple, no jugular venous distention. No thyroid enlargement, no tenderness.  LUNGS: Normal breath sounds bilaterally,  no wheezing, rales, rhonchi. No use of accessory muscles of respiration.  CARDIOVASCULAR: S1, S2 normal. No murmurs, rubs, or gallops.  ABDOMEN: Soft, nontender, nondistended. Bowel sounds present. No organomegaly or mass.  EXTREMITIES: No cyanosis, clubbing or edema b/l.    NEUROLOGIC: Cranial nerves II through XII are intact. No focal Motor or sensory deficits b/l. Increased tone throughout and bradykinesia.  Unable to extend arms & legs fully as if mildly contracted.  PSYCHIATRIC:  patient is alert and oriented x 3. Good affect.  SKIN: No obvious rash, lesion, or ulcer.  Bruising noted on b/l knees with abrasions.   DATA REVIEW:   CBC  Recent Labs Lab 07/19/16 0325  WBC 7.7  HGB 12.7  HCT 36.4  PLT 171    Chemistries   Recent Labs Lab 07/19/16 0325  NA 136  K 3.4*  CL 103  CO2 28  GLUCOSE 105*  BUN 9  CREATININE 0.49  CALCIUM 8.3*    Cardiac Enzymes  Recent Labs Lab 07/16/16 2231  TROPONINI 0.03*    Microbiology Results  Results for orders placed or performed in visit on 05/04/16  Microscopic Examination     Status: None   Collection Time: 05/04/16  3:25 PM  Result Value Ref Range Status   WBC, UA 0-5 0 - 5 /hpf Final   RBC, UA 0-2 0 - 2 /hpf Final   Epithelial Cells (non renal) None seen 0 - 10 /hpf Final   Bacteria, UA Few None seen/Few Final    RADIOLOGY:  No results found.    Management plans discussed with the patient, family and they are in agreement.  CODE STATUS:     Code Status Orders        Start     Ordered   07/17/16 0329  Full code  Continuous     07/17/16 0328    Code Status History    Date Active Date Inactive Code Status Order ID Comments User Context   This patient has a current code status but no historical code status.      TOTAL TIME TAKING CARE OF THIS PATIENT: 40 minutes.    Henreitta Leber M.D on 07/19/2016 at 12:19 PM  Between 7am to 6pm - Pager - [phone]  After 6pm go to [email or web address] - [facility]  Office  [phone]  CC: Primary care physician; Park Liter, DO

## 2016-07-20 DIAGNOSIS — I10 Essential (primary) hypertension: Secondary | ICD-10-CM | POA: Diagnosis not present

## 2016-07-20 DIAGNOSIS — W19XXXA Unspecified fall, initial encounter: Secondary | ICD-10-CM | POA: Diagnosis not present

## 2016-07-20 DIAGNOSIS — G20 Parkinson's disease: Secondary | ICD-10-CM | POA: Diagnosis not present

## 2016-07-20 DIAGNOSIS — M6282 Rhabdomyolysis: Secondary | ICD-10-CM | POA: Diagnosis not present

## 2016-07-21 ENCOUNTER — Ambulatory Visit: Payer: PPO | Admitting: Family Medicine

## 2016-07-22 LAB — VITAMIN B1: VITAMIN B1 (THIAMINE): 165.4 nmol/L (ref 66.5–200.0)

## 2016-07-29 NOTE — Progress Notes (Signed)
   07/19/16 0901  PT Time Calculation  PT Start Time (ACUTE ONLY) 0901  PT Stop Time (ACUTE ONLY) 0925  PT Time Calculation (min) (ACUTE ONLY) 24 min  PT G-Codes **NOT FOR INPATIENT CLASS**  Functional Assessment Tool Used clinical judgement  Functional Limitation Mobility: Walking and moving around  Mobility: Walking and Moving Around Current Status [id number]) CL  Mobility: Walking and Moving Around Goal Status [id number]) CK  PT General Charges  $$ ACUTE PT VISIT 1 Procedure  PT Evaluation  $PT Eval Moderate Complexity 1 Procedure  PT Treatments  $Gait Training 8-22 mins   Late entry G-Codes entered by Phillips Grout PT, DPT after chart review. Assessment performed and documented by Phillips Grout PT, DPT    Phillips Grout PT, DPT   3:14 PM 07/29/16

## 2016-08-04 ENCOUNTER — Other Ambulatory Visit: Payer: Self-pay

## 2016-08-11 ENCOUNTER — Ambulatory Visit: Payer: Self-pay | Admitting: Internal Medicine

## 2016-08-12 DIAGNOSIS — G20 Parkinson's disease: Secondary | ICD-10-CM | POA: Diagnosis not present

## 2016-09-01 DIAGNOSIS — H26492 Other secondary cataract, left eye: Secondary | ICD-10-CM | POA: Diagnosis not present

## 2016-09-03 ENCOUNTER — Ambulatory Visit (INDEPENDENT_AMBULATORY_CARE_PROVIDER_SITE_OTHER): Payer: PPO | Admitting: Family Medicine

## 2016-09-03 ENCOUNTER — Encounter: Payer: Self-pay | Admitting: Family Medicine

## 2016-09-03 VITALS — BP 134/83 | HR 82 | Temp 97.9°F | Wt 135.4 lb

## 2016-09-03 DIAGNOSIS — Z23 Encounter for immunization: Secondary | ICD-10-CM

## 2016-09-03 DIAGNOSIS — G20 Parkinson's disease: Secondary | ICD-10-CM | POA: Diagnosis not present

## 2016-09-03 DIAGNOSIS — I10 Essential (primary) hypertension: Secondary | ICD-10-CM

## 2016-09-03 DIAGNOSIS — G20A1 Parkinson's disease without dyskinesia, without mention of fluctuations: Secondary | ICD-10-CM

## 2016-09-03 MED ORDER — LOSARTAN POTASSIUM 25 MG PO TABS: 12.5000 mg | ORAL_TABLET | Freq: Every day | ORAL | 1 refills | Status: DC

## 2016-09-03 MED ORDER — HYDROCHLOROTHIAZIDE 25 MG PO TABS: 25.0000 mg | ORAL_TABLET | Freq: Every day | ORAL | 3 refills | Status: DC

## 2016-09-03 NOTE — Patient Instructions (Signed)

## 2016-09-03 NOTE — Assessment & Plan Note (Signed)
Due to swelling in her legs, will start HCTZ and cut losartan in 1/2 to avoid low BP. Recheck 1 month.

## 2016-09-03 NOTE — Progress Notes (Signed)
BP 134/83 (BP Location: Left Arm, Patient Position: Sitting, Cuff Size: Normal)   Pulse 82   Temp 97.9 F (36.6 C)   Wt 135 lb 6.4 oz (61.4 kg)   SpO2 99%   BMI 20.59 kg/m    Subjective:    Patient ID: Melissa Wise, female    DOB: 12-03-1950, 65 y.o.   MRN: [id number]  HPI: Melissa Wise is a 65 y.o. female  Chief Complaint  Patient presents with  . Hypertension   HYPERTENSION Hypertension status: controlled  Satisfied with current treatment? yes Duration of hypertension: chronic BP monitoring frequency:  not checking BP medication side effects:  no Medication compliance: excellent compliance Aspirin: no Recurrent headaches: no Visual changes: no Palpitations: no Dyspnea: no Chest pain: no Lower extremity edema: yes Dizzy/lightheaded: yes  Hospitalized in September for a fall and rhabdomyolysis. Diagnosed with Parkinson's and started on Sinnemet. Followed up with Neurology at [facility] October 5th and changed to selegiline and requip. Doesn't feel like it's doing as well as the sinnemet. Seeing neurology again next week.  Relevant past medical, surgical, family and social history reviewed and updated as indicated. Interim medical history since our last visit reviewed. Allergies and medications reviewed and updated.  Review of Systems  Respiratory: Negative.   Cardiovascular: Negative.   Neurological: Positive for weakness. Negative for dizziness, tremors, seizures, syncope, facial asymmetry, speech difficulty, light-headedness, numbness and headaches.  Psychiatric/Behavioral: Negative.     Per HPI unless specifically indicated above     Objective:    BP 134/83 (BP Location: Left Arm, Patient Position: Sitting, Cuff Size: Normal)   Pulse 82   Temp 97.9 F (36.6 C)   Wt 135 lb 6.4 oz (61.4 kg)   SpO2 99%   BMI 20.59 kg/m   Wt Readings from Last 3 Encounters:  09/03/16 135 lb 6.4 oz (61.4 kg)  07/19/16 141 lb 12.8 oz (64.3 kg)  06/25/16 138 lb (62.6 kg)    Physical Exam  Constitutional: She is oriented to person, place, and time. She appears well-developed and well-nourished. No distress.  HENT:  Head: Normocephalic and atraumatic.  Right Ear: Hearing normal.  Left Ear: Hearing normal.  Nose: Nose normal.  Eyes: Conjunctivae and lids are normal. Right eye exhibits no discharge. Left eye exhibits no discharge. No scleral icterus.  Cardiovascular: Normal rate, regular rhythm, normal heart sounds and intact distal pulses.  Exam reveals no gallop and no friction rub.   No murmur heard. Pulmonary/Chest: Effort normal and breath sounds normal. No respiratory distress. She has no wheezes. She has no rales. She exhibits no tenderness.  Musculoskeletal: Normal range of motion. She exhibits edema.  Neurological: She is alert and oriented to person, place, and time.  Skin: Skin is warm, dry and intact. No rash noted. No erythema. No pallor.  Psychiatric: She has a normal mood and affect. Her speech is normal and behavior is normal. Judgment and thought content normal. Cognition and memory are normal.  Nursing note and vitals reviewed.   Results for orders placed or performed during the hospital encounter of [id number]  Basic metabolic panel  Result Value Ref Range   Sodium 127 (L) 135 - 145 mmol/L   Potassium 3.7 3.5 - 5.1 mmol/L   Chloride 91 (L) 101 - 111 mmol/L   CO2 30 22 - 32 mmol/L   Glucose, Bld 117 (H) 65 - 99 mg/dL   BUN 12 6 - 20 mg/dL   Creatinine, Ser 0.54 0.44 - 1.00 mg/dL  Calcium 9.2 8.9 - 10.3 mg/dL   GFR calc non Af Amer >60 >60 mL/min   GFR calc Af Amer >60 >60 mL/min   Anion gap 6 5 - 15  CBC  Result Value Ref Range   WBC 17.7 (H) 3.6 - 11.0 K/uL   RBC 4.77 3.80 - 5.20 MIL/uL   Hemoglobin 14.3 12.0 - 16.0 g/dL   HCT 40.6 35.0 - 47.0 %   MCV 85.0 80.0 - 100.0 fL   MCH 29.9 26.0 - 34.0 pg   MCHC 35.2 32.0 - 36.0 g/dL   RDW 13.6 11.5 - 14.5 %   Platelets 208 150 - 440 K/uL  Urinalysis complete, with microscopic  Result  Value Ref Range   Color, Urine YELLOW (A) YELLOW   APPearance HAZY (A) CLEAR   Glucose, UA 150 (A) NEGATIVE mg/dL   Bilirubin Urine NEGATIVE NEGATIVE   Ketones, ur NEGATIVE NEGATIVE mg/dL   Specific Gravity, Urine 1.016 1.005 - 1.030   Hgb urine dipstick NEGATIVE NEGATIVE   pH 6.0 5.0 - 8.0   Protein, ur NEGATIVE NEGATIVE mg/dL   Nitrite NEGATIVE NEGATIVE   Leukocytes, UA NEGATIVE NEGATIVE   RBC / HPF 0-5 0 - 5 RBC/hpf   WBC, UA 0-5 0 - 5 WBC/hpf   Bacteria, UA NONE SEEN NONE SEEN   Squamous Epithelial / LPF NONE SEEN NONE SEEN   Mucous PRESENT    Amorphous Crystal PRESENT   Glucose, capillary  Result Value Ref Range   Glucose-Capillary 113 (H) 65 - 99 mg/dL  CK  Result Value Ref Range   Total CK 1,345 (H) 38 - 234 U/L  Troponin I  Result Value Ref Range   Troponin I 0.03 (HH) <0.03 ng/mL  TSH  Result Value Ref Range   TSH 0.832 0.350 - 4.500 uIU/mL  Hemoglobin A1c  Result Value Ref Range   Hgb A1c MFr Bld 5.2 4.0 - 6.0 %  CK  Result Value Ref Range   Total CK 490 (H) 38 - 234 U/L  Sodium  Result Value Ref Range   Sodium 134 (L) 135 - 145 mmol/L  Vitamin B12  Result Value Ref Range   Vitamin B-12 414 180 - 914 pg/mL  Vitamin B1  Result Value Ref Range   Vitamin B1 (Thiamine) 165.4 66.5 - 200.0 nmol/L  Heavy metals, blood  Result Value Ref Range   Arsenic 7 2 - 23 ug/L   Mercury None Detected 0.0 - 14.9 ug/L   Lead 1 0 - 19 ug/dL  Sedimentation rate  Result Value Ref Range   Sed Rate 6 0 - 30 mm/hr  CK  Result Value Ref Range   Total CK 312 (H) 38 - 234 U/L  CBC  Result Value Ref Range   WBC 7.7 3.6 - 11.0 K/uL   RBC 4.22 3.80 - 5.20 MIL/uL   Hemoglobin 12.7 12.0 - 16.0 g/dL   HCT 36.4 35.0 - 47.0 %   MCV 86.4 80.0 - 100.0 fL   MCH 30.1 26.0 - 34.0 pg   MCHC 34.8 32.0 - 36.0 g/dL   RDW 13.4 11.5 - 14.5 %   Platelets 171 150 - 440 K/uL  Basic metabolic panel  Result Value Ref Range   Sodium 136 135 - 145 mmol/L   Potassium 3.4 (L) 3.5 - 5.1 mmol/L     Chloride 103 101 - 111 mmol/L   CO2 28 22 - 32 mmol/L   Glucose, Bld 105 (H) 65 - 99 mg/dL  BUN 9 6 - 20 mg/dL   Creatinine, Ser 0.49 0.44 - 1.00 mg/dL   Calcium 8.3 (L) 8.9 - 10.3 mg/dL   GFR calc non Af Amer >60 >60 mL/min   GFR calc Af Amer >60 >60 mL/min   Anion gap 5 5 - 15      Assessment & Plan:   Problem List Items Addressed This Visit      Cardiovascular and Mediastinum   Hypertension - Primary    Due to swelling in her legs, will start HCTZ and cut losartan in 1/2 to avoid low BP. Recheck 1 month.       Relevant Medications   hydrochlorothiazide (HYDRODIURIL) 25 MG tablet   losartan (COZAAR) 25 MG tablet   Other Relevant Orders   Basic metabolic panel     Nervous and Auditory   Parkinson disease ([place])    Continue to follow with neurology. Call with any concerns.       Relevant Medications   selegiline (ELDEPRYL) 5 MG tablet   rOPINIRole (REQUIP) 0.5 MG tablet    Other Visit Diagnoses    Immunization due       Flu shot given today.   Relevant Orders   Flu vaccine HIGH DOSE PF (Fluzone High dose) (Completed)       Follow up plan: No Follow-up on file.

## 2016-09-03 NOTE — Assessment & Plan Note (Signed)
Continue to follow with neurology. Call with any concerns.

## 2016-09-04 LAB — BASIC METABOLIC PANEL
BUN/Creatinine Ratio: 16 (ref 12–28)
BUN: 12 mg/dL (ref 8–27)
CALCIUM: 9.7 mg/dL (ref 8.7–10.3)
CO2: 29 mmol/L (ref 18–29)
CREATININE: 0.73 mg/dL (ref 0.57–1.00)
Chloride: 97 mmol/L (ref 96–106)
GFR calc Af Amer: 100 mL/min/{1.73_m2} (ref >59)
GFR calc non Af Amer: 87 mL/min/{1.73_m2} (ref >59)
GLUCOSE: 80 mg/dL (ref 65–99)
Potassium: 4.2 mmol/L (ref 3.5–5.2)
Sodium: 139 mmol/L (ref 134–144)

## 2016-09-06 ENCOUNTER — Encounter: Payer: Self-pay | Admitting: Family Medicine

## 2016-09-06 NOTE — Progress Notes (Signed)
letter

## 2016-09-09 DIAGNOSIS — G20 Parkinson's disease: Secondary | ICD-10-CM | POA: Diagnosis not present

## 2016-09-16 DIAGNOSIS — G20 Parkinson's disease: Secondary | ICD-10-CM | POA: Diagnosis not present

## 2016-09-16 DIAGNOSIS — R2689 Other abnormalities of gait and mobility: Secondary | ICD-10-CM | POA: Diagnosis not present

## 2016-09-16 DIAGNOSIS — Z9181 History of falling: Secondary | ICD-10-CM | POA: Diagnosis not present

## 2016-09-16 DIAGNOSIS — I10 Essential (primary) hypertension: Secondary | ICD-10-CM | POA: Diagnosis not present

## 2016-09-20 DIAGNOSIS — I10 Essential (primary) hypertension: Secondary | ICD-10-CM | POA: Diagnosis not present

## 2016-09-20 DIAGNOSIS — R2689 Other abnormalities of gait and mobility: Secondary | ICD-10-CM | POA: Diagnosis not present

## 2016-09-20 DIAGNOSIS — Z9181 History of falling: Secondary | ICD-10-CM | POA: Diagnosis not present

## 2016-09-20 DIAGNOSIS — G20 Parkinson's disease: Secondary | ICD-10-CM | POA: Diagnosis not present

## 2016-09-22 DIAGNOSIS — Z9181 History of falling: Secondary | ICD-10-CM | POA: Diagnosis not present

## 2016-09-22 DIAGNOSIS — G20 Parkinson's disease: Secondary | ICD-10-CM | POA: Diagnosis not present

## 2016-09-22 DIAGNOSIS — R2689 Other abnormalities of gait and mobility: Secondary | ICD-10-CM | POA: Diagnosis not present

## 2016-09-22 DIAGNOSIS — I10 Essential (primary) hypertension: Secondary | ICD-10-CM | POA: Diagnosis not present

## 2016-10-04 ENCOUNTER — Telehealth: Payer: Self-pay | Admitting: Family Medicine

## 2016-10-04 ENCOUNTER — Ambulatory Visit (INDEPENDENT_AMBULATORY_CARE_PROVIDER_SITE_OTHER): Payer: PPO | Admitting: Family Medicine

## 2016-10-04 ENCOUNTER — Other Ambulatory Visit: Payer: Self-pay | Admitting: Family Medicine

## 2016-10-04 ENCOUNTER — Encounter: Payer: Self-pay | Admitting: Family Medicine

## 2016-10-04 VITALS — BP 114/72 | HR 96 | Temp 99.0°F | Wt 139.8 lb

## 2016-10-04 DIAGNOSIS — R600 Localized edema: Secondary | ICD-10-CM | POA: Insufficient documentation

## 2016-10-04 DIAGNOSIS — I10 Essential (primary) hypertension: Secondary | ICD-10-CM

## 2016-10-04 DIAGNOSIS — R609 Edema, unspecified: Secondary | ICD-10-CM | POA: Diagnosis not present

## 2016-10-04 MED ORDER — LOVASTATIN 40 MG PO TABS: 40.0000 mg | ORAL_TABLET | Freq: Every day | ORAL | 1 refills | Status: DC

## 2016-10-04 MED ORDER — LOSARTAN POTASSIUM 25 MG PO TABS: 12.5000 mg | ORAL_TABLET | Freq: Every day | ORAL | 1 refills | Status: DC

## 2016-10-04 MED ORDER — HYDROCHLOROTHIAZIDE 25 MG PO TABS: 25.0000 mg | ORAL_TABLET | Freq: Every day | ORAL | 1 refills | Status: DC

## 2016-10-04 NOTE — Assessment & Plan Note (Signed)
Not significantly better on HCTZ. Will start compression stockings. Call if not getting better. Elevate when able.

## 2016-10-04 NOTE — Telephone Encounter (Signed)
Left message for patient's sister, letting he know that we would need a copy to fill out.

## 2016-10-04 NOTE — Telephone Encounter (Signed)
I do not have this form. If they get me the form, I'd be happy to fill it out. [place] should be able to send it to me

## 2016-10-04 NOTE — Progress Notes (Signed)
BP 114/72 (BP Location: Left Arm, Patient Position: Sitting, Cuff Size: Normal)   Pulse 96   Temp 99 F (37.2 C)   Wt 139 lb 12.8 oz (63.4 kg)   SpO2 100%   BMI 21.26 kg/m    Subjective:    Patient ID: Melissa Wise, female    DOB: June 30, 1951, 65 y.o.   MRN: [id number]  HPI: Melissa Wise is a 65 y.o. female  Chief Complaint  Patient presents with  . Hypertension   HYPERTENSION Hypertension status: controlled  Satisfied with current treatment? yes Duration of hypertension: chronic BP monitoring frequency:  a few times a week BP medication side effects:  no Medication compliance: excellent compliance Previous BP meds: losartan-HCTZ Aspirin: no Recurrent headaches: no Visual changes: no Palpitations: no Dyspnea: no Chest pain: no Lower extremity edema: no Dizzy/lightheaded: no   Relevant past medical, surgical, family and social history reviewed and updated as indicated. Interim medical history since our last visit reviewed. Allergies and medications reviewed and updated.  Review of Systems  Constitutional: Negative.   Respiratory: Negative.   Cardiovascular: Positive for leg swelling. Negative for chest pain and palpitations.  Musculoskeletal: Negative.   Psychiatric/Behavioral: Negative.     Per HPI unless specifically indicated above     Objective:    BP 114/72 (BP Location: Left Arm, Patient Position: Sitting, Cuff Size: Normal)   Pulse 96   Temp 99 F (37.2 C)   Wt 139 lb 12.8 oz (63.4 kg)   SpO2 100%   BMI 21.26 kg/m   Wt Readings from Last 3 Encounters:  10/04/16 139 lb 12.8 oz (63.4 kg)  09/03/16 135 lb 6.4 oz (61.4 kg)  07/19/16 141 lb 12.8 oz (64.3 kg)    Physical Exam  Constitutional: She is oriented to person, place, and time. She appears well-developed and well-nourished. No distress.  HENT:  Head: Normocephalic and atraumatic.  Right Ear: Hearing normal.  Left Ear: Hearing normal.  Nose: Nose normal.  Eyes: Conjunctivae and  lids are normal. Right eye exhibits no discharge. Left eye exhibits no discharge. No scleral icterus.  Cardiovascular: Normal rate, regular rhythm, normal heart sounds and intact distal pulses.  Exam reveals no gallop and no friction rub.   No murmur heard. Pulmonary/Chest: Effort normal and breath sounds normal. No respiratory distress. She has no wheezes. She has no rales. She exhibits no tenderness.  Musculoskeletal: Normal range of motion. She exhibits edema (3+ edema bilaterally).  Neurological: She is alert and oriented to person, place, and time.  Skin: Skin is warm, dry and intact. No rash noted. No erythema. No pallor.  Psychiatric: She has a normal mood and affect. Her speech is normal and behavior is normal. Judgment and thought content normal. Cognition and memory are normal.  Nursing note and vitals reviewed.   Results for orders placed or performed in visit on [id number]  Basic metabolic panel  Result Value Ref Range   Glucose 80 65 - 99 mg/dL   BUN 12 8 - 27 mg/dL   Creatinine, Ser 0.73 0.57 - 1.00 mg/dL   GFR calc non Af Amer 87 >59 mL/min/1.73   GFR calc Af Amer 100 >59 mL/min/1.73   BUN/Creatinine Ratio 16 12 - 28   Sodium 139 134 - 144 mmol/L   Potassium 4.2 3.5 - 5.2 mmol/L   Chloride 97 96 - 106 mmol/L   CO2 29 18 - 29 mmol/L   Calcium 9.7 8.7 - 10.3 mg/dL      Assessment &  Plan:   Problem List Items Addressed This Visit      Cardiovascular and Mediastinum   Hypertension - Primary    Under good control on current regimen. Continue current regimen. Continue to monitor. Recheck 6 months.       Relevant Medications   losartan (COZAAR) 25 MG tablet   hydrochlorothiazide (HYDRODIURIL) 25 MG tablet   lovastatin (MEVACOR) 40 MG tablet     Other   Peripheral edema    Not significantly better on HCTZ. Will start compression stockings. Call if not getting better. Elevate when able.           Follow up plan: Return in about 6 months (around 04/03/2017) for  Physical.

## 2016-10-04 NOTE — Patient Instructions (Addendum)
Edema  Edema is an abnormal buildup of fluids. It is more common in your legs and thighs. Painless swelling of the feet and ankles is more likely as a person ages. It also is common in looser skin, like around your eyes.  Follow these instructions at home:  ? Keep the affected body part above the level of the heart while lying down.  ? Do not sit still or stand for a long time.  ? Do not put anything right under your knees when you lie down.  ? Do not wear tight clothes on your upper legs.  ? Exercise your legs to help the puffiness (swelling) go down.  ? Wear elastic bandages or support stockings as told by your doctor.  ? A low-salt diet may help lessen the puffiness.  ? Only take medicine as told by your doctor.  Contact a doctor if:  ? Treatment is not working.  ? You have heart, liver, or kidney disease and notice that your skin looks puffy or shiny.  ? You have puffiness in your legs that does not get better when you raise your legs.  ? You have sudden weight gain for no reason.  Get help right away if:  ? You have shortness of breath or chest pain.  ? You cannot breathe when you lie down.  ? You have pain, redness, or warmth in the areas that are puffy.  ? You have heart, liver, or kidney disease and get edema all of a sudden.  ? You have a fever and your symptoms get worse all of a sudden.  This information is not intended to replace advice given to you by your health care provider. Make sure you discuss any questions you have with your health care provider.  Document Released: 04/12/2008 Document Revised: 04/01/2016 Document Reviewed: 08/17/2013  Elsevier Interactive Patient Education ? 2017 [employer]

## 2016-10-04 NOTE — Assessment & Plan Note (Signed)
Under good control on current regimen. Continue current regimen. Continue to monitor. Recheck 6 months.

## 2016-10-04 NOTE — Telephone Encounter (Signed)
Pt's sister called stated she is trying to get some home health services for the patient through [employer]. She needs Dr. Wynetta Emery to fill out a [place] form for [employer]. Please fax to the number on the form to start this process. Please call pt's sister with any questions. Thanks.

## 2016-10-05 ENCOUNTER — Encounter: Payer: Self-pay | Admitting: Family Medicine

## 2016-10-05 LAB — BASIC METABOLIC PANEL
BUN/Creatinine Ratio: 17 (ref 12–28)
BUN: 11 mg/dL (ref 8–27)
CALCIUM: 9 mg/dL (ref 8.7–10.3)
CO2: 27 mmol/L (ref 18–29)
CREATININE: 0.64 mg/dL (ref 0.57–1.00)
Chloride: 91 mmol/L — ABNORMAL LOW (ref 96–106)
GFR calc Af Amer: 108 mL/min/{1.73_m2} (ref >59)
GFR calc non Af Amer: 94 mL/min/{1.73_m2} (ref >59)
GLUCOSE: 92 mg/dL (ref 65–99)
POTASSIUM: 4.1 mmol/L (ref 3.5–5.2)
SODIUM: 133 mmol/L — AB (ref 134–144)

## 2016-10-07 DIAGNOSIS — G20 Parkinson's disease: Secondary | ICD-10-CM | POA: Diagnosis not present

## 2016-10-19 DIAGNOSIS — H04123 Dry eye syndrome of bilateral lacrimal glands: Secondary | ICD-10-CM | POA: Diagnosis not present

## 2016-10-21 ENCOUNTER — Encounter: Payer: Self-pay | Admitting: Family Medicine

## 2016-10-21 ENCOUNTER — Ambulatory Visit (INDEPENDENT_AMBULATORY_CARE_PROVIDER_SITE_OTHER): Payer: PPO | Admitting: Family Medicine

## 2016-10-21 VITALS — BP 119/76 | HR 86 | Temp 98.5°F | Wt 137.2 lb

## 2016-10-21 DIAGNOSIS — I10 Essential (primary) hypertension: Secondary | ICD-10-CM | POA: Diagnosis not present

## 2016-10-21 DIAGNOSIS — E782 Mixed hyperlipidemia: Secondary | ICD-10-CM | POA: Diagnosis not present

## 2016-10-21 DIAGNOSIS — R609 Edema, unspecified: Secondary | ICD-10-CM | POA: Diagnosis not present

## 2016-10-21 DIAGNOSIS — Z780 Asymptomatic menopausal state: Secondary | ICD-10-CM

## 2016-10-21 DIAGNOSIS — R600 Localized edema: Secondary | ICD-10-CM

## 2016-10-21 DIAGNOSIS — Z1231 Encounter for screening mammogram for malignant neoplasm of breast: Secondary | ICD-10-CM | POA: Diagnosis not present

## 2016-10-21 DIAGNOSIS — Z1239 Encounter for other screening for malignant neoplasm of breast: Secondary | ICD-10-CM

## 2016-10-21 DIAGNOSIS — Z0000 Encounter for general adult medical examination without abnormal findings: Secondary | ICD-10-CM | POA: Diagnosis not present

## 2016-10-21 DIAGNOSIS — G20 Parkinson's disease: Secondary | ICD-10-CM

## 2016-10-21 DIAGNOSIS — Z1211 Encounter for screening for malignant neoplasm of colon: Secondary | ICD-10-CM

## 2016-10-21 DIAGNOSIS — G20A1 Parkinson's disease without dyskinesia, without mention of fluctuations: Secondary | ICD-10-CM

## 2016-10-21 LAB — UA/M W/RFLX CULTURE, ROUTINE
BILIRUBIN UA: NEGATIVE
GLUCOSE, UA: NEGATIVE
KETONES UA: NEGATIVE
LEUKOCYTES UA: NEGATIVE
Nitrite, UA: NEGATIVE
PH UA: 8.5 — AB (ref 5.0–7.5)
PROTEIN UA: NEGATIVE
RBC UA: NEGATIVE
SPEC GRAV UA: 1.015 (ref 1.005–1.030)
Urobilinogen, Ur: 0.2 mg/dL (ref 0.2–1.0)

## 2016-10-21 LAB — FECAL OCCULT BLOOD, GUAIAC: Fecal Occult Blood: NEGATIVE

## 2016-10-21 LAB — MICROALBUMIN, URINE WAIVED
CREATININE, URINE WAIVED: 50 mg/dL (ref 10–300)
MICROALB, UR WAIVED: 10 mg/L (ref 0–19)

## 2016-10-21 NOTE — Assessment & Plan Note (Signed)
Rechecking levels today. Continue diet and exercise. Continue to monitor. Call with any concerns.  

## 2016-10-21 NOTE — Assessment & Plan Note (Signed)
Wearing her compression stockings and doing well with them.

## 2016-10-21 NOTE — Assessment & Plan Note (Signed)
Under good control. Continue current regimen. Continue to monitor. Call with any concerns. 

## 2016-10-21 NOTE — Assessment & Plan Note (Signed)
Continue to follow with neurology. Adjusting medications.

## 2016-10-21 NOTE — Progress Notes (Signed)
BP 119/76 (BP Location: Left Arm, Patient Position: Sitting, Cuff Size: Normal)   Pulse 86   Temp 98.5 F (36.9 C)   Wt 137 lb 3.2 oz (62.2 kg)   SpO2 100%   BMI 20.86 kg/m    Subjective:    Patient ID: Melissa Wise, female    DOB: February 16, 1951, 65 y.o.   MRN: [id number]  HPI: Melissa Wise is a 65 y.o. female presenting on 10/21/2016 for comprehensive medical examination. Current medical complaints include:none  She currently lives with: her sister Menopausal Symptoms: no  Functional Status Survey: Is the patient deaf or have difficulty hearing?: No Does the patient have difficulty seeing, even when wearing glasses/contacts?: No Does the patient have difficulty concentrating, remembering, or making decisions?: Yes Does the patient have difficulty walking or climbing stairs?: Yes Does the patient have difficulty dressing or bathing?: No Does the patient have difficulty doing errands alone such as visiting a doctor's office or shopping?: No  Fall Risk  10/21/2016 05/04/2016  Falls in the past year? Yes No  Number falls in past yr: 1 -  Injury with Fall? No -  Risk for fall due to : Impaired balance/gait;Impaired mobility -  Follow up Falls evaluation completed -    Depression Screen Depression screen [facility] 2/9 10/21/2016 05/04/2016  Decreased Interest 0 0  Down, Depressed, Hopeless 1 0  PHQ - 2 Score 1 0   Advanced Directives Does patient have a HCPOA?    no Does patient have a living will or MOST form?  no  Past Medical History:  Past Medical History:  Diagnosis Date  . Allergy   . Ataxia   . GERD (gastroesophageal reflux disease)   . Hyperglycemia   . Hyperlipidemia   . Hypertension   . Insomnia   . Low back pain     Surgical History:  Past Surgical History:  Procedure Laterality Date  . ABDOMINAL HYSTERECTOMY  1991   Complete, no cancer  . CATARACT EXTRACTION Left   . SHOULDER SURGERY Right 2015    Medications:  Current Outpatient Prescriptions  on File Prior to Visit  Medication Sig  . b complex vitamins capsule Take 1 capsule by mouth daily.  . COCONUT OIL PO Take by mouth.  . hydrochlorothiazide (HYDRODIURIL) 25 MG tablet Take 1 tablet (25 mg total) by mouth daily.  [name] losartan (COZAAR) 25 MG tablet Take 0.5 tablets (12.5 mg total) by mouth daily.  [name] lovastatin (MEVACOR) 40 MG tablet Take 1 tablet (40 mg total) by mouth daily. 1 and 1/2 tablets with dinner  . Magnesium 400 MG CAPS Take by mouth.  . Multiple Vitamin (MULTIVITAMIN) tablet Take 1 tablet by mouth daily.  . Omega-3 Fatty Acids (FISH OIL) 1000 MG CAPS Take 300 mg by mouth.   [name] OVER THE COUNTER MEDICATION VitaFusion Fiber Well, sugar free gummies with digestive health, regularity support and probiotic  . selegiline (ELDEPRYL) 5 MG tablet TK 1 T PO  BID WITH MEALS WITH BREAKFAST AND LUNCH  . Vitamin D, Cholecalciferol, 1000 units CAPS Take 1,000 Units by mouth.   No current facility-administered medications on file prior to visit.     Allergies:  Allergies  Allergen Reactions  . Amlodipine Swelling  . Enalapril Cough    Social History:  Social History   Social History  . Marital status: Single    Spouse name: N/A  . Number of children: N/A  . Years of education: N/A   Occupational History  . Not on  file.   Social History Main Topics  . Smoking status: Never Smoker  . Smokeless tobacco: Never Used  . Alcohol use 0.0 oz/week     Comment: occassionally  . Drug use: No  . Sexual activity: Not Currently   Other Topics Concern  . Not on file   Social History Narrative  . No narrative on file   History  Smoking Status  . Never Smoker  Smokeless Tobacco  . Never Used   History  Alcohol Use  . 0.0 oz/week    Comment: occassionally    Family History:  Family History  Problem Relation Age of Onset  . Cancer Mother   . Hypertension Father   . Heart attack Father   . Diabetes Maternal Grandmother   . Cancer Paternal Grandmother     Stomach      Past medical history, surgical history, medications, allergies, family history and social history reviewed with patient today and changes made to appropriate areas of the chart.   Review of Systems  Constitutional: Negative.   HENT: Negative.   Eyes: Negative.   Respiratory: Positive for cough. Negative for hemoptysis, sputum production, shortness of breath and wheezing.   Cardiovascular: Positive for leg swelling. Negative for chest pain, palpitations, orthopnea, claudication and PND.  Gastrointestinal: Positive for nausea. Negative for abdominal pain, blood in stool, constipation, diarrhea, heartburn, melena and vomiting.  Genitourinary: Negative.   Musculoskeletal: Negative.   Skin: Negative.   Neurological: Negative.   Endo/Heme/Allergies: Negative for environmental allergies and polydipsia. Bruises/bleeds easily.  Psychiatric/Behavioral: Negative.     All other ROS negative except what is listed above and in the HPI.      Objective:    BP 119/76 (BP Location: Left Arm, Patient Position: Sitting, Cuff Size: Normal)   Pulse 86   Temp 98.5 F (36.9 C)   Wt 137 lb 3.2 oz (62.2 kg)   SpO2 100%   BMI 20.86 kg/m   Wt Readings from Last 3 Encounters:  10/21/16 137 lb 3.2 oz (62.2 kg)  10/04/16 139 lb 12.8 oz (63.4 kg)  09/03/16 135 lb 6.4 oz (61.4 kg)   No exam data present  Physical Exam  6CIT Screen 10/21/2016  What Year? 0 points  What month? 0 points  What time? 0 points  Count back from 20 0 points  Months in reverse 0 points  Repeat phrase 2 points  Total Score 2    Results for orders placed or performed in visit on [id number]  Basic metabolic panel  Result Value Ref Range   Glucose 92 65 - 99 mg/dL   BUN 11 8 - 27 mg/dL   Creatinine, Ser 0.64 0.57 - 1.00 mg/dL   GFR calc non Af Amer 94 >59 mL/min/1.73   GFR calc Af Amer 108 >59 mL/min/1.73   BUN/Creatinine Ratio 17 12 - 28   Sodium 133 (L) 134 - 144 mmol/L   Potassium 4.1 3.5 - 5.2 mmol/L   Chloride  91 (L) 96 - 106 mmol/L   CO2 27 18 - 29 mmol/L   Calcium 9.0 8.7 - 10.3 mg/dL      Assessment & Plan:   Problem List Items Addressed This Visit      Cardiovascular and Mediastinum   Hypertension    Under good control. Continue current regimen. Continue to monitor. Call with any concerns.       Relevant Orders   CBC with Differential/Platelet   Comprehensive metabolic panel   Microalbumin, Urine Waived  TSH   UA/M w/rflx Culture, Routine     Nervous and Auditory   Parkinson disease ([place])    Continue to follow with neurology. Adjusting medications.       Relevant Medications   carbidopa-levodopa (SINEMET IR) 25-100 MG tablet     Other   Hyperlipidemia    Rechecking levels today. Continue diet and exercise. Continue to monitor. Call with any concerns.       Relevant Orders   Comprehensive metabolic panel   Lipid Panel w/o Chol/HDL Ratio   Peripheral edema    Wearing her compression stockings and doing well with them.       Relevant Orders   CBC with Differential/Platelet   Comprehensive metabolic panel   UA/M w/rflx Culture, Routine    Other Visit Diagnoses    Medicare annual wellness visit, initial    -  Primary   up to date on vaccines. Screening labs checked. Preventative care discussed as below.    Screening for breast cancer       Mammogram ordered today.   Relevant Orders   MM DIGITAL SCREENING BILATERAL   Postmenopausal estrogen deficiency       DEXA ordered today.    Relevant Orders   DG Bone Density   Screening for colon cancer       Would like to do stool cards- ordered today.       Preventative Services:  Health Risk Assessment and Personalized Prevention Plan: Done today Bone Mass Measurements: Ordered today Breast Cancer Screening: Ordered today CVD Screening: Done today Cervical Cancer Screening: N/A Colon Cancer Screening:  Depression Screening: Done today Diabetes Screening: Done today Glaucoma Screening: See your eye  doctor Hepatitis B vaccine: N/A Hepatitis C screening: Done previously HIV Screening: Declined Flu Vaccine: Up to date Lung cancer Screening: N/A Obesity Screening: Done today Pneumonia Vaccines (2): Up to date, due for another in June STI Screening: N/A  Follow up plan: Return in about 6 months (around 04/21/2017) for OK to change May appointment.   LABORATORY TESTING:  - Pap smear: not applicable  IMMUNIZATIONS:   - Tdap: Tetanus vaccination status reviewed: last tetanus booster within 10 years. - Influenza: Up to date - Pneumovax: Not applicable - Prevnar: Up to date - Zostavax vaccine: Up to date  SCREENING: -Mammogram: Ordered today  - Colonoscopy: Will do stool cards  - Bone Density: Ordered today  -Hearing Test: Ordered today  -Spirometry: Not applicable   PATIENT COUNSELING:   Advised to take 1 mg of folate supplement per day if capable of pregnancy.   Sexuality: Discussed sexually transmitted diseases, partner selection, use of condoms, avoidance of unintended pregnancy  and contraceptive alternatives.   Advised to avoid cigarette smoking.  I discussed with the patient that most people either abstain from alcohol or drink within safe limits (<=14/week and <=4 drinks/occasion for males, <=7/weeks and <= 3 drinks/occasion for females) and that the risk for alcohol disorders and other health effects rises proportionally with the number of drinks per week and how often a drinker exceeds daily limits.  Discussed cessation/primary prevention of drug use and availability of treatment for abuse.   Diet: Encouraged to adjust caloric intake to maintain  or achieve ideal body weight, to reduce intake of dietary saturated fat and total fat, to limit sodium intake by avoiding high sodium foods and not adding table salt, and to maintain adequate dietary potassium and calcium preferably from fresh fruits, vegetables, and low-fat dairy products.    stressed the  importance of  regular exercise  Injury prevention: Discussed safety belts, safety helmets, smoke detector, smoking near bedding or upholstery.   Dental health: Discussed importance of regular tooth brushing, flossing, and dental visits.    NEXT PREVENTATIVE PHYSICAL DUE IN 1 YEAR. Return in about 6 months (around 04/21/2017) for OK to change May appointment.

## 2016-10-21 NOTE — Patient Instructions (Addendum)
Preventative Services:  Health Risk Assessment and Personalized Prevention Plan: Done today Bone Mass Measurements: Ordered today Breast Cancer Screening: Ordered today CVD Screening: Done today Cervical Cancer Screening: N/A Colon Cancer Screening:  Depression Screening: Done today Diabetes Screening: Done today Glaucoma Screening: See your eye doctor Hepatitis B vaccine: N/A Hepatitis C screening: Done previously HIV Screening: Declined Flu Vaccine: Up to date Lung cancer Screening: N/A Obesity Screening: Done today Pneumonia Vaccines (2): Up to date, due for another in June STI Screening: N/A Health Maintenance, Female Introduction Adopting a healthy lifestyle and getting preventive care can go a long way to promote health and wellness. Talk with your health care provider about what schedule of regular examinations is right for you. This is a good chance for you to check in with your provider about disease prevention and staying healthy. In between checkups, there are plenty of things you can do on your own. Experts have done a lot of research about which lifestyle changes and preventive measures are most likely to keep you healthy. Ask your health care provider for more information. Weight and diet Eat a healthy diet  Be sure to include plenty of vegetables, fruits, low-fat dairy products, and lean protein.  Do not eat a lot of foods high in solid fats, added sugars, or salt.  Get regular exercise. This is one of the most important things you can do for your health.  Most adults should exercise for at least 150 minutes each week. The exercise should increase your heart rate and make you sweat (moderate-intensity exercise).  Most adults should also do strengthening exercises at least twice a week. This is in addition to the moderate-intensity exercise. Maintain a healthy weight  Body mass index (BMI) is a measurement that can be used to identify possible weight problems. It  estimates body fat based on height and weight. Your health care provider can help determine your BMI and help you achieve or maintain a healthy weight.  For females 5 years of age and older:  A BMI below 18.5 is considered underweight.  A BMI of 18.5 to 24.9 is normal.  A BMI of 25 to 29.9 is considered overweight.  A BMI of 30 and above is considered obese. Watch levels of cholesterol and blood lipids  You should start having your blood tested for lipids and cholesterol at 65 years of age, then have this test every 5 years.  You may need to have your cholesterol levels checked more often if:  Your lipid or cholesterol levels are high.  You are older than 65 years of age.  You are at high risk for heart disease. Cancer screening Lung Cancer  Lung cancer screening is recommended for adults 51-73 years old who are at high risk for lung cancer because of a history of smoking.  A yearly low-dose CT scan of the lungs is recommended for people who:  Currently smoke.  Have quit within the past 15 years.  Have at least a 30-pack-year history of smoking. A pack year is smoking an average of one pack of cigarettes a day for 1 year.  Yearly screening should continue until it has been 15 years since you quit.  Yearly screening should stop if you develop a health problem that would prevent you from having lung cancer treatment. Breast Cancer  Practice breast self-awareness. This means understanding how your breasts normally appear and feel.  It also means doing regular breast self-exams. Let your health care provider know about any  changes, no matter how small.  If you are in your 20s or 30s, you should have a clinical breast exam (CBE) by a health care provider every 1-3 years as part of a regular health exam.  If you are 70 or older, have a CBE every year. Also consider having a breast X-ray (mammogram) every year.  If you have a family history of breast cancer, talk to your  health care provider about genetic screening.  If you are at high risk for breast cancer, talk to your health care provider about having an MRI and a mammogram every year.  Breast cancer gene (BRCA) assessment is recommended for women who have family members with BRCA-related cancers. BRCA-related cancers include:  Breast.  Ovarian.  Tubal.  Peritoneal cancers.  Results of the assessment will determine the need for genetic counseling and BRCA1 and BRCA2 testing. Cervical Cancer  Your health care provider may recommend that you be screened regularly for cancer of the pelvic organs (ovaries, uterus, and vagina). This screening involves a pelvic examination, including checking for microscopic changes to the surface of your cervix (Pap test). You may be encouraged to have this screening done every 3 years, beginning at age 24.  For women ages 89-65, health care providers may recommend pelvic exams and Pap testing every 3 years, or they may recommend the Pap and pelvic exam, combined with testing for human papilloma virus (HPV), every 5 years. Some types of HPV increase your risk of cervical cancer. Testing for HPV may also be done on women of any age with unclear Pap test results.  Other health care providers may not recommend any screening for nonpregnant women who are considered low risk for pelvic cancer and who do not have symptoms. Ask your health care provider if a screening pelvic exam is right for you.  If you have had past treatment for cervical cancer or a condition that could lead to cancer, you need Pap tests and screening for cancer for at least 20 years after your treatment. If Pap tests have been discontinued, your risk factors (such as having a new sexual partner) need to be reassessed to determine if screening should resume. Some women have medical problems that increase the chance of getting cervical cancer. In these cases, your health care provider may recommend more frequent  screening and Pap tests. Colorectal Cancer  This type of cancer can be detected and often prevented.  Routine colorectal cancer screening usually begins at 65 years of age and continues through 65 years of age.  Your health care provider may recommend screening at an earlier age if you have risk factors for colon cancer.  Your health care provider may also recommend using home test kits to check for hidden blood in the stool.  A small camera at the end of a tube can be used to examine your colon directly (sigmoidoscopy or colonoscopy). This is done to check for the earliest forms of colorectal cancer.  Routine screening usually begins at age 61.  Direct examination of the colon should be repeated every 5-10 years through 65 years of age. However, you may need to be screened more often if early forms of precancerous polyps or small growths are found. Skin Cancer  Check your skin from head to toe regularly.  Tell your health care provider about any new moles or changes in moles, especially if there is a change in a mole's shape or color.  Also tell your health care provider if you have  a mole that is larger than the size of a pencil eraser.  Always use sunscreen. Apply sunscreen liberally and repeatedly throughout the day.  Protect yourself by wearing long sleeves, pants, a wide-brimmed hat, and sunglasses whenever you are outside. Heart disease, diabetes, and high blood pressure  High blood pressure causes heart disease and increases the risk of stroke. High blood pressure is more likely to develop in:  People who have blood pressure in the high end of the normal range (130-139/85-89 mm Hg).  People who are overweight or obese.  People who are African American.  If you are 25-45 years of age, have your blood pressure checked every 3-5 years. If you are 42 years of age or older, have your blood pressure checked every year. You should have your blood pressure measured twice-once  when you are at a hospital or clinic, and once when you are not at a hospital or clinic. Record the average of the two measurements. To check your blood pressure when you are not at a hospital or clinic, you can use:  An automated blood pressure machine at a pharmacy.  A home blood pressure monitor.  If you are between 69 years and 59 years old, ask your health care provider if you should take aspirin to prevent strokes.  Have regular diabetes screenings. This involves taking a blood sample to check your fasting blood sugar level.  If you are at a normal weight and have a low risk for diabetes, have this test once every three years after 65 years of age.  If you are overweight and have a high risk for diabetes, consider being tested at a younger age or more often. Preventing infection Hepatitis B  If you have a higher risk for hepatitis B, you should be screened for this virus. You are considered at high risk for hepatitis B if:  You were born in a country where hepatitis B is common. Ask your health care provider which countries are considered high risk.  Your parents were born in a high-risk country, and you have not been immunized against hepatitis B (hepatitis B vaccine).  You have HIV or AIDS.  You use needles to inject street drugs.  You live with someone who has hepatitis B.  You have had sex with someone who has hepatitis B.  You get hemodialysis treatment.  You take certain medicines for conditions, including cancer, organ transplantation, and autoimmune conditions. Hepatitis C  Blood testing is recommended for:  Everyone born from [birth year] through 1965.  Anyone with known risk factors for hepatitis C. Sexually transmitted infections (STIs)  You should be screened for sexually transmitted infections (STIs) including gonorrhea and chlamydia if:  You are sexually active and are younger than 65 years of age.  You are older than 64 years of age and your health care  provider tells you that you are at risk for this type of infection.  Your sexual activity has changed since you were last screened and you are at an increased risk for chlamydia or gonorrhea. Ask your health care provider if you are at risk.  If you do not have HIV, but are at risk, it may be recommended that you take a prescription medicine daily to prevent HIV infection. This is called pre-exposure prophylaxis (PrEP). You are considered at risk if:  You are sexually active and do not regularly use condoms or know the HIV status of your partner(s).  You take drugs by injection.  You are sexually  active with a partner who has HIV. Talk with your health care provider about whether you are at high risk of being infected with HIV. If you choose to begin PrEP, you should first be tested for HIV. You should then be tested every 3 months for as long as you are taking PrEP. Pregnancy  If you are premenopausal and you may become pregnant, ask your health care provider about preconception counseling.  If you may become pregnant, take 400 to 800 micrograms (mcg) of folic acid every day.  If you want to prevent pregnancy, talk to your health care provider about birth control (contraception). Osteoporosis and menopause  Osteoporosis is a disease in which the bones lose minerals and strength with aging. This can result in serious bone fractures. Your risk for osteoporosis can be identified using a bone density scan.  If you are 27 years of age or older, or if you are at risk for osteoporosis and fractures, ask your health care provider if you should be screened.  Ask your health care provider whether you should take a calcium or vitamin D supplement to lower your risk for osteoporosis.  Menopause may have certain physical symptoms and risks.  Hormone replacement therapy may reduce some of these symptoms and risks. Talk to your health care provider about whether hormone replacement therapy is right  for you. Follow these instructions at home:  Schedule regular health, dental, and eye exams.  Stay current with your immunizations.  Do not use any tobacco products including cigarettes, chewing tobacco, or electronic cigarettes.  If you are pregnant, do not drink alcohol.  If you are breastfeeding, limit how much and how often you drink alcohol.  Limit alcohol intake to no more than 1 drink per day for nonpregnant women. One drink equals 12 ounces of beer, 5 ounces of wine, or 1 ounces of hard liquor.  Do not use street drugs.  Do not share needles.  Ask your health care provider for help if you need support or information about quitting drugs.  Tell your health care provider if you often feel depressed.  Tell your health care provider if you have ever been abused or do not feel safe at home. This information is not intended to replace advice given to you by your health care provider. Make sure you discuss any questions you have with your health care provider. Document Released: 05/10/2011 Document Revised: 04/01/2016 Document Reviewed: 07/29/2015  2017 [employer]  Health Maintenance for Postmenopausal Women Introduction Menopause is a normal process in which your reproductive ability comes to an end. This process happens gradually over a span of months to years, usually between the ages of 38 and 56. Menopause is complete when you have missed 12 consecutive menstrual periods. It is important to talk with your health care provider about some of the most common conditions that affect postmenopausal women, such as heart disease, cancer, and bone loss (osteoporosis). Adopting a healthy lifestyle and getting preventive care can help to promote your health and wellness. Those actions can also lower your chances of developing some of these common conditions. What should I know about menopause? During menopause, you may experience a number of symptoms, such as:  Moderate-to-severe hot  flashes.  Night sweats.  Decrease in sex drive.  Mood swings.  Headaches.  Tiredness.  Irritability.  Memory problems.  Insomnia. Choosing to treat or not to treat menopausal changes is an individual decision that you make with your health care provider. What should I know  about hormone replacement therapy and supplements? Hormone therapy products are effective for treating symptoms that are associated with menopause, such as hot flashes and night sweats. Hormone replacement carries certain risks, especially as you become older. If you are thinking about using estrogen or estrogen with progestin treatments, discuss the benefits and risks with your health care provider. What should I know about heart disease and stroke? Heart disease, heart attack, and stroke become more likely as you age. This may be due, in part, to the hormonal changes that your body experiences during menopause. These can affect how your body processes dietary fats, triglycerides, and cholesterol. Heart attack and stroke are both medical emergencies. There are many things that you can do to help prevent heart disease and stroke:  Have your blood pressure checked at least every 1-2 years. High blood pressure causes heart disease and increases the risk of stroke.  If you are 59-50 years old, ask your health care provider if you should take aspirin to prevent a heart attack or a stroke.  Do not use any tobacco products, including cigarettes, chewing tobacco, or electronic cigarettes. If you need help quitting, ask your health care provider.  It is important to eat a healthy diet and maintain a healthy weight.  Be sure to include plenty of vegetables, fruits, low-fat dairy products, and lean protein.  Avoid eating foods that are high in solid fats, added sugars, or salt (sodium).  Get regular exercise. This is one of the most important things that you can do for your health.  Try to exercise for at least 150  minutes each week. The type of exercise that you do should increase your heart rate and make you sweat. This is known as moderate-intensity exercise.  Try to do strengthening exercises at least twice each week. Do these in addition to the moderate-intensity exercise.  Know your numbers.Ask your health care provider to check your cholesterol and your blood glucose. Continue to have your blood tested as directed by your health care provider. What should I know about cancer screening? There are several types of cancer. Take the following steps to reduce your risk and to catch any cancer development as early as possible. Breast Cancer  Practice breast self-awareness.  This means understanding how your breasts normally appear and feel.  It also means doing regular breast self-exams. Let your health care provider know about any changes, no matter how small.  If you are 77 or older, have a clinician do a breast exam (clinical breast exam or CBE) every year. Depending on your age, family history, and medical history, it may be recommended that you also have a yearly breast X-ray (mammogram).  If you have a family history of breast cancer, talk with your health care provider about genetic screening.  If you are at high risk for breast cancer, talk with your health care provider about having an MRI and a mammogram every year.  Breast cancer (BRCA) gene test is recommended for women who have family members with BRCA-related cancers. Results of the assessment will determine the need for genetic counseling and BRCA1 and for BRCA2 testing. BRCA-related cancers include these types:  Breast. This occurs in males or females.  Ovarian.  Tubal. This may also be called fallopian tube cancer.  Cancer of the abdominal or pelvic lining (peritoneal cancer).  Prostate.  Pancreatic. Cervical, Uterine, and Ovarian Cancer  Your health care provider may recommend that you be screened regularly for cancer of  the pelvic organs.  These include your ovaries, uterus, and vagina. This screening involves a pelvic exam, which includes checking for microscopic changes to the surface of your cervix (Pap test).  For women ages 21-65, health care providers may recommend a pelvic exam and a Pap test every three years. For women ages 22-65, they may recommend the Pap test and pelvic exam, combined with testing for human papilloma virus (HPV), every five years. Some types of HPV increase your risk of cervical cancer. Testing for HPV may also be done on women of any age who have unclear Pap test results.  Other health care providers may not recommend any screening for nonpregnant women who are considered low risk for pelvic cancer and have no symptoms. Ask your health care provider if a screening pelvic exam is right for you.  If you have had past treatment for cervical cancer or a condition that could lead to cancer, you need Pap tests and screening for cancer for at least 20 years after your treatment. If Pap tests have been discontinued for you, your risk factors (such as having a new sexual partner) need to be reassessed to determine if you should start having screenings again. Some women have medical problems that increase the chance of getting cervical cancer. In these cases, your health care provider may recommend that you have screening and Pap tests more often.  If you have a family history of uterine cancer or ovarian cancer, talk with your health care provider about genetic screening.  If you have vaginal bleeding after reaching menopause, tell your health care provider.  There are currently no reliable tests available to screen for ovarian cancer. Lung Cancer  Lung cancer screening is recommended for adults 65-62 years old who are at high risk for lung cancer because of a history of smoking. A yearly low-dose CT scan of the lungs is recommended if you:  Currently smoke.  Have a history of at least 30  pack-years of smoking and you currently smoke or have quit within the past 15 years. A pack-year is smoking an average of one pack of cigarettes per day for one year. Yearly screening should:  Continue until it has been 15 years since you quit.  Stop if you develop a health problem that would prevent you from having lung cancer treatment. Colorectal Cancer  This type of cancer can be detected and can often be prevented.  Routine colorectal cancer screening usually begins at age 39 and continues through age 56.  If you have risk factors for colon cancer, your health care provider may recommend that you be screened at an earlier age.  If you have a family history of colorectal cancer, talk with your health care provider about genetic screening.  Your health care provider may also recommend using home test kits to check for hidden blood in your stool.  A small camera at the end of a tube can be used to examine your colon directly (sigmoidoscopy or colonoscopy). This is done to check for the earliest forms of colorectal cancer.  Direct examination of the colon should be repeated every 5-10 years until age 76. However, if early forms of precancerous polyps or small growths are found or if you have a family history or genetic risk for colorectal cancer, you may need to be screened more often. Skin Cancer  Check your skin from head to toe regularly.  Monitor any moles. Be sure to tell your health care provider:  About any new moles or changes in  moles, especially if there is a change in a mole's shape or color.  If you have a mole that is larger than the size of a pencil eraser.  If any of your family members has a history of skin cancer, especially at a young age, talk with your health care provider about genetic screening.  Always use sunscreen. Apply sunscreen liberally and repeatedly throughout the day.  Whenever you are outside, protect yourself by wearing long sleeves, pants, a  wide-brimmed hat, and sunglasses. What should I know about osteoporosis? Osteoporosis is a condition in which bone destruction happens more quickly than new bone creation. After menopause, you may be at an increased risk for osteoporosis. To help prevent osteoporosis or the bone fractures that can happen because of osteoporosis, the following is recommended:  If you are 66-37 years old, get at least 1,000 mg of calcium and at least 600 mg of vitamin D per day.  If you are older than age 44 but younger than age 54, get at least 1,200 mg of calcium and at least 600 mg of vitamin D per day.  If you are older than age 21, get at least 1,200 mg of calcium and at least 800 mg of vitamin D per day. Smoking and excessive alcohol intake increase the risk of osteoporosis. Eat foods that are rich in calcium and vitamin D, and do weight-bearing exercises several times each week as directed by your health care provider. What should I know about how menopause affects my mental health? Depression may occur at any age, but it is more common as you become older. Common symptoms of depression include:  Low or sad mood.  Changes in sleep patterns.  Changes in appetite or eating patterns.  Feeling an overall lack of motivation or enjoyment of activities that you previously enjoyed.  Frequent crying spells. Talk with your health care provider if you think that you are experiencing depression. What should I know about immunizations? It is important that you get and maintain your immunizations. These include:  Tetanus, diphtheria, and pertussis (Tdap) booster vaccine.  Influenza every year before the flu season begins.  Pneumonia vaccine.  Shingles vaccine. Your health care provider may also recommend other immunizations. This information is not intended to replace advice given to you by your health care provider. Make sure you discuss any questions you have with your health care provider. Document  Released: 12/17/2005 Document Revised: 05/14/2016 Document Reviewed: 07/29/2015  2017 [employer]

## 2016-10-22 ENCOUNTER — Encounter: Payer: Self-pay | Admitting: Family Medicine

## 2016-10-22 LAB — COMPREHENSIVE METABOLIC PANEL
A/G RATIO: 2.2 (ref 1.2–2.2)
ALK PHOS: 54 IU/L (ref 39–117)
ALT: 12 IU/L (ref 0–32)
AST: 21 IU/L (ref 0–40)
Albumin: 4.3 g/dL (ref 3.6–4.8)
BUN/Creatinine Ratio: 15 (ref 12–28)
BUN: 10 mg/dL (ref 8–27)
Bilirubin Total: 0.5 mg/dL (ref 0.0–1.2)
CALCIUM: 9.2 mg/dL (ref 8.7–10.3)
CO2: 29 mmol/L (ref 18–29)
CREATININE: 0.68 mg/dL (ref 0.57–1.00)
Chloride: 89 mmol/L — ABNORMAL LOW (ref 96–106)
GFR calc Af Amer: 106 mL/min/{1.73_m2} (ref >59)
GFR, EST NON AFRICAN AMERICAN: 92 mL/min/{1.73_m2} (ref >59)
GLOBULIN, TOTAL: 2 g/dL (ref 1.5–4.5)
Glucose: 87 mg/dL (ref 65–99)
POTASSIUM: 3.9 mmol/L (ref 3.5–5.2)
SODIUM: 132 mmol/L — AB (ref 134–144)
Total Protein: 6.3 g/dL (ref 6.0–8.5)

## 2016-10-22 LAB — LIPID PANEL W/O CHOL/HDL RATIO
CHOLESTEROL TOTAL: 129 mg/dL (ref 100–199)
HDL: 64 mg/dL (ref >39)
LDL Calculated: 50 mg/dL (ref 0–99)
TRIGLYCERIDES: 75 mg/dL (ref 0–149)
VLDL Cholesterol Cal: 15 mg/dL (ref 5–40)

## 2016-10-22 LAB — CBC WITH DIFFERENTIAL/PLATELET
Basophils Absolute: 0 10*3/uL (ref 0.0–0.2)
Basos: 0 %
EOS (ABSOLUTE): 0 10*3/uL (ref 0.0–0.4)
Eos: 0 %
HEMATOCRIT: 39.7 % (ref 34.0–46.6)
Hemoglobin: 13.4 g/dL (ref 11.1–15.9)
IMMATURE GRANULOCYTES: 0 %
Immature Grans (Abs): 0 10*3/uL (ref 0.0–0.1)
LYMPHS ABS: 2 10*3/uL (ref 0.7–3.1)
Lymphs: 35 %
MCH: 28.7 pg (ref 26.6–33.0)
MCHC: 33.8 g/dL (ref 31.5–35.7)
MCV: 85 fL (ref 79–97)
MONOS ABS: 0.5 10*3/uL (ref 0.1–0.9)
Monocytes: 9 %
NEUTROS PCT: 56 %
Neutrophils Absolute: 3.2 10*3/uL (ref 1.4–7.0)
PLATELETS: 197 10*3/uL (ref 150–379)
RBC: 4.67 x10E6/uL (ref 3.77–5.28)
RDW: 14.2 % (ref 12.3–15.4)
WBC: 5.7 10*3/uL (ref 3.4–10.8)

## 2016-10-22 LAB — TSH: TSH: 0.913 u[IU]/mL (ref 0.450–4.500)

## 2016-10-24 DIAGNOSIS — Z9181 History of falling: Secondary | ICD-10-CM | POA: Diagnosis not present

## 2016-10-24 DIAGNOSIS — I10 Essential (primary) hypertension: Secondary | ICD-10-CM | POA: Diagnosis not present

## 2016-10-24 DIAGNOSIS — G20 Parkinson's disease: Secondary | ICD-10-CM | POA: Diagnosis not present

## 2016-10-24 DIAGNOSIS — R2689 Other abnormalities of gait and mobility: Secondary | ICD-10-CM | POA: Diagnosis not present

## 2016-10-28 ENCOUNTER — Other Ambulatory Visit: Payer: PPO

## 2016-10-28 DIAGNOSIS — Z1211 Encounter for screening for malignant neoplasm of colon: Secondary | ICD-10-CM

## 2016-10-28 LAB — FECAL OCCULT BLOOD, GUAIAC
SPECIMEN 1: NEGATIVE
SPECIMEN 2: NEGATIVE
Specimen 3: NEGATIVE

## 2016-11-29 ENCOUNTER — Encounter: Payer: Self-pay | Admitting: Family Medicine

## 2016-12-28 ENCOUNTER — Ambulatory Visit: Payer: PPO | Admitting: Family Medicine

## 2017-01-03 ENCOUNTER — Ambulatory Visit
Admission: RE | Admit: 2017-01-03 | Discharge: 2017-01-03 | Disposition: A | Discharge status: Home / Self Care | Payer: PPO | Source: Ambulatory Visit | Attending: Family Medicine | Admitting: Family Medicine

## 2017-01-03 ENCOUNTER — Encounter: Payer: Self-pay | Admitting: Family Medicine

## 2017-01-03 ENCOUNTER — Ambulatory Visit (INDEPENDENT_AMBULATORY_CARE_PROVIDER_SITE_OTHER): Payer: PPO | Admitting: Family Medicine

## 2017-01-03 VITALS — BP 123/77 | HR 89 | Temp 98.5°F | Wt 141.8 lb

## 2017-01-03 DIAGNOSIS — M19072 Primary osteoarthritis, left ankle and foot: Secondary | ICD-10-CM | POA: Diagnosis not present

## 2017-01-03 DIAGNOSIS — M79672 Pain in left foot: Secondary | ICD-10-CM | POA: Insufficient documentation

## 2017-01-03 NOTE — Patient Instructions (Signed)
Follow up as needed

## 2017-01-03 NOTE — Progress Notes (Signed)
   BP 123/77 (BP Location: Right Arm, Patient Position: Sitting, Cuff Size: Normal)   Pulse 89   Temp 98.5 F (36.9 C)   Wt 141 lb 12.8 oz (64.3 kg)   SpO2 99%   BMI 21.56 kg/m    Subjective:    Patient ID: Melissa Wise, female    DOB: 1951/10/15, 66 y.o.   MRN: [id number]  HPI: Melissa Wise is a 66 y.o. female  Chief Complaint  Patient presents with  . Leg Pain    Lost balance on the 12/12/2016.    Patient presents with persistent left foot pain following an episode of losing balance on left leg that happened 12/12/16. Denies falling during the episode, but may have taken a bad step - unsure what exactly happened at the time of the incident. Dull pain that is sharp with weight bearing, comes and goes. Worst in morning and gets better as the day progresses. Has been using heat and taking tylenol. Walking fine with it. Denies history of foot injury. Hx of Parkinson's Disease.        Relevant past medical, surgical, family and social history reviewed and updated as indicated. Interim medical history since our last visit reviewed. Allergies and medications reviewed and updated.  Review of Systems  Constitutional: Negative.   HENT: Negative.   Eyes: Negative.   Respiratory: Negative.   Cardiovascular: Negative.   Gastrointestinal: Negative.   Endocrine: Negative.   Genitourinary: Negative.   Musculoskeletal: Positive for arthralgias. Negative for joint swelling.  Neurological: Negative.   Psychiatric/Behavioral: Negative.     Per HPI unless specifically indicated above     Objective:    BP 123/77 (BP Location: Right Arm, Patient Position: Sitting, Cuff Size: Normal)   Pulse 89   Temp 98.5 F (36.9 C)   Wt 141 lb 12.8 oz (64.3 kg)   SpO2 99%   BMI 21.56 kg/m   Wt Readings from Last 3 Encounters:  01/03/17 141 lb 12.8 oz (64.3 kg)  10/21/16 137 lb 3.2 oz (62.2 kg)  10/04/16 139 lb 12.8 oz (63.4 kg)    Physical Exam  Constitutional: She appears well-developed  and well-nourished.  HENT:  Head: Atraumatic.  Eyes: Conjunctivae are normal. Pupils are equal, round, and reactive to light.  Neck: Normal range of motion. Neck supple.  Cardiovascular: Normal rate, normal heart sounds and intact distal pulses.   Pulmonary/Chest: Effort normal.  Musculoskeletal: Normal range of motion. She exhibits no edema or tenderness.  Strength full and equal b/l LEs  Neurological: She is alert.  Skin: Skin is warm and dry.  Psychiatric: She has a normal mood and affect.  Nursing note and vitals reviewed.     Assessment & Plan:   Problem List Items Addressed This Visit    None    Visit Diagnoses    Left foot pain    -  Primary   Await left foot x-ray. Continue rest, ice/heat, epsom salt soaks, tylenol, aleve. Follow up if no improvement   Relevant Orders   DG Foot Complete Left (Completed)       Follow up plan: Return if symptoms worsen or fail to improve.

## 2017-01-04 ENCOUNTER — Telehealth: Payer: Self-pay | Admitting: Family Medicine

## 2017-01-04 NOTE — Telephone Encounter (Signed)
Patient notified

## 2017-01-04 NOTE — Telephone Encounter (Signed)
Please call pt and let her know that her foot x-ray showed degenerative changes of arthritis but no acute issue to explain her new pain. Continue rest, heat, tylenol, ibuprofen. Follow up if not improving.

## 2017-01-11 ENCOUNTER — Ambulatory Visit: Payer: PPO | Admitting: Family Medicine

## 2017-01-12 ENCOUNTER — Ambulatory Visit: Payer: PPO | Admitting: Family Medicine

## 2017-01-13 DIAGNOSIS — G20 Parkinson's disease: Secondary | ICD-10-CM | POA: Diagnosis not present

## 2017-02-01 ENCOUNTER — Ambulatory Visit: Payer: PPO | Admitting: Family Medicine

## 2017-03-31 ENCOUNTER — Encounter: Payer: PPO | Admitting: Family Medicine

## 2017-04-20 ENCOUNTER — Telehealth: Payer: Self-pay | Admitting: Family Medicine

## 2017-04-21 ENCOUNTER — Telehealth: Payer: Self-pay | Admitting: Family Medicine

## 2017-04-21 ENCOUNTER — Encounter: Payer: Self-pay | Admitting: Family Medicine

## 2017-04-21 ENCOUNTER — Ambulatory Visit (INDEPENDENT_AMBULATORY_CARE_PROVIDER_SITE_OTHER): Payer: PPO | Admitting: Family Medicine

## 2017-04-21 VITALS — BP 132/82 | HR 95 | Temp 99.0°F | Wt 148.5 lb

## 2017-04-21 DIAGNOSIS — G20 Parkinson's disease: Secondary | ICD-10-CM | POA: Diagnosis not present

## 2017-04-21 DIAGNOSIS — E782 Mixed hyperlipidemia: Secondary | ICD-10-CM | POA: Diagnosis not present

## 2017-04-21 DIAGNOSIS — I10 Essential (primary) hypertension: Secondary | ICD-10-CM

## 2017-04-21 MED ORDER — HYDROCHLOROTHIAZIDE 25 MG PO TABS: 25.0000 mg | ORAL_TABLET | Freq: Every day | ORAL | 1 refills | Status: DC

## 2017-04-21 MED ORDER — LOVASTATIN 40 MG PO TABS: 40.0000 mg | ORAL_TABLET | Freq: Every day | ORAL | 1 refills | Status: DC

## 2017-04-21 MED ORDER — LOVASTATIN 40 MG PO TABS: ORAL_TABLET | ORAL | 1 refills | Status: DC

## 2017-04-21 MED ORDER — LOSARTAN POTASSIUM 25 MG PO TABS: 12.5000 mg | ORAL_TABLET | Freq: Every day | ORAL | 1 refills | Status: DC

## 2017-04-21 NOTE — Assessment & Plan Note (Signed)
Under good control. Continue current regimen. Continue to monitor. Call with any concerns. 

## 2017-04-21 NOTE — Telephone Encounter (Signed)
Pharmacy would like clarification for lovastatin (MEVACOR) 40 MG tablet.

## 2017-04-21 NOTE — Progress Notes (Signed)
BP 132/82 (BP Location: Left Arm, Patient Position: Sitting, Cuff Size: Normal)   Pulse 95   Temp 99 F (37.2 C)   Wt 148 lb 8 oz (67.4 kg)   SpO2 98%   BMI 22.58 kg/m    Subjective:    Patient ID: Melissa Wise, female    DOB: 12-12-1950, 66 y.o.   MRN: [id number]  HPI: Melissa Wise is a 66 y.o. female  Chief Complaint  Patient presents with  . Hypertension    refill on hydrochlorothiazide  . Hyperlipidemia    refill on lovastatin   HYPERTENSION / HYPERLIPIDEMIA Satisfied with current treatment? yes Duration of hypertension: chronic BP monitoring frequency: not checking BP medication side effects: no Past BP meds: losartan, HCTZ Duration of hyperlipidemia: chronic Cholesterol medication side effects: no Cholesterol supplements: fish oil Past cholesterol medications: lovastatin Medication compliance: excellent compliance Aspirin: no Recent stressors: no Recurrent headaches: no Visual changes: no Palpitations: no Dyspnea: no Chest pain: no Lower extremity edema: no Dizzy/lightheaded: no  Relevant past medical, surgical, family and social history reviewed and updated as indicated. Interim medical history since our last visit reviewed. Allergies and medications reviewed and updated.  Review of Systems  Constitutional: Negative.   Respiratory: Negative.   Cardiovascular: Negative.   Psychiatric/Behavioral: Negative.     Per HPI unless specifically indicated above     Objective:    BP 132/82 (BP Location: Left Arm, Patient Position: Sitting, Cuff Size: Normal)   Pulse 95   Temp 99 F (37.2 C)   Wt 148 lb 8 oz (67.4 kg)   SpO2 98%   BMI 22.58 kg/m   Wt Readings from Last 3 Encounters:  04/21/17 148 lb 8 oz (67.4 kg)  01/03/17 141 lb 12.8 oz (64.3 kg)  10/21/16 137 lb 3.2 oz (62.2 kg)    Physical Exam  Constitutional: She is oriented to person, place, and time. She appears well-developed and well-nourished. No distress.  HENT:  Head:  Normocephalic and atraumatic.  Right Ear: Hearing normal.  Left Ear: Hearing normal.  Nose: Nose normal.  Eyes: Conjunctivae and lids are normal. Right eye exhibits no discharge. Left eye exhibits no discharge. No scleral icterus.  Cardiovascular: Normal rate, regular rhythm, normal heart sounds and intact distal pulses.  Exam reveals no gallop and no friction rub.   No murmur heard. Pulmonary/Chest: Effort normal and breath sounds normal. No respiratory distress. She has no wheezes. She has no rales. She exhibits no tenderness.  Musculoskeletal: Normal range of motion.  Neurological: She is alert and oriented to person, place, and time.  Skin: Skin is warm, dry and intact. No rash noted. She is not diaphoretic. No erythema. No pallor.  Psychiatric: She has a normal mood and affect. Her speech is normal and behavior is normal. Judgment and thought content normal. Cognition and memory are normal.  Nursing note and vitals reviewed.   Results for orders placed or performed in visit on 10/28/16  Fecal Occult Blood, Guaiac  Result Value Ref Range   Specimen 1 Negative    Specimen 2 Negative    Specimen 3 Negative       Assessment & Plan:   Problem List Items Addressed This Visit      Cardiovascular and Mediastinum   Hypertension - Primary    Under good control. Continue current regimen. Continue to monitor. Call with any concerns.       Relevant Medications   lovastatin (MEVACOR) 40 MG tablet   losartan (COZAAR) 25 MG  tablet   hydrochlorothiazide (HYDRODIURIL) 25 MG tablet   Other Relevant Orders   Comprehensive metabolic panel     Nervous and Auditory   Parkinson disease (HCC)    Stable. Continue to follow with neurology. Call with any concerns.         Other   Hyperlipidemia    Under good control. Continue current regimen. Continue to monitor. Call with any concerns.       Relevant Medications   lovastatin (MEVACOR) 40 MG tablet   losartan (COZAAR) 25 MG tablet    hydrochlorothiazide (HYDRODIURIL) 25 MG tablet   Other Relevant Orders   Comprehensive metabolic panel   Lipid Panel w/o Chol/HDL Ratio       Follow up plan: Return in about 6 months (around 10/21/2017) for Wellness.

## 2017-04-21 NOTE — Telephone Encounter (Signed)
Verbal from Dr.Johnson that Lovastatin 1 1/2 tablets is correct.

## 2017-04-21 NOTE — Assessment & Plan Note (Signed)
Stable. Continue to follow with neurology. Call with any concerns.  

## 2017-04-21 NOTE — Addendum Note (Signed)
Addended by: [name] on: 04/21/2017 04:39 PM   Modules accepted: Orders

## 2017-04-22 ENCOUNTER — Encounter: Payer: Self-pay | Admitting: Family Medicine

## 2017-04-22 LAB — COMPREHENSIVE METABOLIC PANEL
A/G RATIO: 1.8 (ref 1.2–2.2)
ALT: 15 IU/L (ref 0–32)
AST: 19 IU/L (ref 0–40)
Albumin: 4.4 g/dL (ref 3.6–4.8)
Alkaline Phosphatase: 49 IU/L (ref 39–117)
BUN/Creatinine Ratio: 15 (ref 12–28)
BUN: 13 mg/dL (ref 8–27)
Bilirubin Total: 0.5 mg/dL (ref 0.0–1.2)
CALCIUM: 9.3 mg/dL (ref 8.7–10.3)
CO2: 28 mmol/L (ref 20–29)
Chloride: 91 mmol/L — ABNORMAL LOW (ref 96–106)
Creatinine, Ser: 0.84 mg/dL (ref 0.57–1.00)
GFR, EST AFRICAN AMERICAN: 84 mL/min/{1.73_m2} (ref >59)
GFR, EST NON AFRICAN AMERICAN: 73 mL/min/{1.73_m2} (ref >59)
Globulin, Total: 2.5 g/dL (ref 1.5–4.5)
Glucose: 96 mg/dL (ref 65–99)
POTASSIUM: 3.8 mmol/L (ref 3.5–5.2)
SODIUM: 132 mmol/L — AB (ref 134–144)
TOTAL PROTEIN: 6.9 g/dL (ref 6.0–8.5)

## 2017-04-22 LAB — LIPID PANEL W/O CHOL/HDL RATIO
Cholesterol, Total: 129 mg/dL (ref 100–199)
HDL: 62 mg/dL (ref >39)
LDL Calculated: 47 mg/dL (ref 0–99)
Triglycerides: 99 mg/dL (ref 0–149)
VLDL Cholesterol Cal: 20 mg/dL (ref 5–40)

## 2017-06-01 ENCOUNTER — Telehealth: Payer: Self-pay | Admitting: Family Medicine

## 2017-07-14 DIAGNOSIS — G20 Parkinson's disease: Secondary | ICD-10-CM | POA: Diagnosis not present

## 2017-09-07 ENCOUNTER — Ambulatory Visit (INDEPENDENT_AMBULATORY_CARE_PROVIDER_SITE_OTHER): Payer: Medicare Other

## 2017-09-07 DIAGNOSIS — Z23 Encounter for immunization: Secondary | ICD-10-CM

## 2017-10-26 ENCOUNTER — Ambulatory Visit: Payer: Medicare Other

## 2017-10-27 ENCOUNTER — Ambulatory Visit (INDEPENDENT_AMBULATORY_CARE_PROVIDER_SITE_OTHER): Payer: Medicare Other

## 2017-10-27 VITALS — BP 140/88 | HR 103 | Temp 98.2°F | Resp 16 | Ht 68.0 in | Wt 151.5 lb

## 2017-10-27 DIAGNOSIS — Z0000 Encounter for general adult medical examination without abnormal findings: Secondary | ICD-10-CM | POA: Diagnosis not present

## 2017-10-27 DIAGNOSIS — Z1211 Encounter for screening for malignant neoplasm of colon: Secondary | ICD-10-CM | POA: Diagnosis not present

## 2017-10-27 DIAGNOSIS — Z23 Encounter for immunization: Secondary | ICD-10-CM | POA: Diagnosis not present

## 2017-10-27 NOTE — Patient Instructions (Signed)
Melissa Wise , Thank you for taking time to come for your Medicare Wellness Visit. I appreciate your ongoing commitment to your health goals. Please review the following plan we discussed and let me know if I can assist you in the future.   Screening recommendations/referrals: Colonoscopy: cologuard ordered today  Mammogram: completed 06/11/2016 Bone Density: completed 06/09/2016 Recommended yearly ophthalmology/optometry visit for glaucoma screening and checkup Recommended yearly dental visit for hygiene and checkup  Vaccinations: Influenza vaccine: up to date Pneumococcal vaccine: pneumovax 23 done today Tdap vaccine: up to date Shingles vaccine: up to date  Advanced directives: Advance directive discussed with you today. I have provided a copy for you to complete at home and have notarized. Once this is complete please bring a copy in to our office so we can scan it into your chart.  Conditions/risks identified: none  Next appointment: Follow up in one year for your annual wellness exam.    Preventive Care 65 Years and Older, Female Preventive care refers to lifestyle choices and visits with your health care provider that can promote health and wellness. What does preventive care include?  A yearly physical exam. This is also called an annual well check.  Dental exams once or twice a year.  Routine eye exams. Ask your health care provider how often you should have your eyes checked.  Personal lifestyle choices, including:  Daily care of your teeth and gums.  Regular physical activity.  Eating a healthy diet.  Avoiding tobacco and drug use.  Limiting alcohol use.  Practicing safe sex.  Taking low-dose aspirin every day.  Taking vitamin and mineral supplements as recommended by your health care provider. What happens during an annual well check? The services and screenings done by your health care provider during your annual well check will depend on your age, overall  health, lifestyle risk factors, and family history of disease. Counseling  Your health care provider may ask you questions about your:  Alcohol use.  Tobacco use.  Drug use.  Emotional well-being.  Home and relationship well-being.  Sexual activity.  Eating habits.  History of falls.  Memory and ability to understand (cognition).  Work and work Statistician.  Reproductive health. Screening  You may have the following tests or measurements:  Height, weight, and BMI.  Blood pressure.  Lipid and cholesterol levels. These may be checked every 5 years, or more frequently if you are over 51 years old.  Skin check.  Lung cancer screening. You may have this screening every year starting at age 45 if you have a 30-pack-year history of smoking and currently smoke or have quit within the past 15 years.  Fecal occult blood test (FOBT) of the stool. You may have this test every year starting at age 2.  Flexible sigmoidoscopy or colonoscopy. You may have a sigmoidoscopy every 5 years or a colonoscopy every 10 years starting at age 7.  Hepatitis C blood test.  Hepatitis B blood test.  Sexually transmitted disease (STD) testing.  Diabetes screening. This is done by checking your blood sugar (glucose) after you have not eaten for a while (fasting). You may have this done every 1-3 years.  Bone density scan. This is done to screen for osteoporosis. You may have this done starting at age 68.  Mammogram. This may be done every 1-2 years. Talk to your health care provider about how often you should have regular mammograms. Talk with your health care provider about your test results, treatment options, and if  necessary, the need for more tests. Vaccines  Your health care provider may recommend certain vaccines, such as:  Influenza vaccine. This is recommended every year.  Tetanus, diphtheria, and acellular pertussis (Tdap, Td) vaccine. You may need a Td booster every 10  years.  Zoster vaccine. You may need this after age 20.  Pneumococcal 13-valent conjugate (PCV13) vaccine. One dose is recommended after age 62.  Pneumococcal polysaccharide (PPSV23) vaccine. One dose is recommended after age 70. Talk to your health care provider about which screenings and vaccines you need and how often you need them. This information is not intended to replace advice given to you by your health care provider. Make sure you discuss any questions you have with your health care provider. Document Released: 11/21/2015 Document Revised: 07/14/2016 Document Reviewed: 08/26/2015 Elsevier Interactive Patient Education  2017 [place] Prevention in the Home Falls can cause injuries. They can happen to people of all ages. There are many things you can do to make your home safe and to help prevent falls. What can I do on the outside of my home?  Regularly fix the edges of walkways and driveways and fix any cracks.  Remove anything that might make you trip as you walk through a door, such as a raised step or threshold.  Trim any bushes or trees on the path to your home.  Use bright outdoor lighting.  Clear any walking paths of anything that might make someone trip, such as rocks or tools.  Regularly check to see if handrails are loose or broken. Make sure that both sides of any steps have handrails.  Any raised decks and porches should have guardrails on the edges.  Have any leaves, snow, or ice cleared regularly.  Use sand or salt on walking paths during winter.  Clean up any spills in your garage right away. This includes oil or grease spills. What can I do in the bathroom?  Use night lights.  Install grab bars by the toilet and in the tub and shower. Do not use towel bars as grab bars.  Use non-skid mats or decals in the tub or shower.  If you need to sit down in the shower, use a plastic, non-slip stool.  Keep the floor dry. Clean up any water that  spills on the floor as soon as it happens.  Remove soap buildup in the tub or shower regularly.  Attach bath mats securely with double-sided non-slip rug tape.  Do not have throw rugs and other things on the floor that can make you trip. What can I do in the bedroom?  Use night lights.  Make sure that you have a light by your bed that is easy to reach.  Do not use any sheets or blankets that are too big for your bed. They should not hang down onto the floor.  Have a firm chair that has side arms. You can use this for support while you get dressed.  Do not have throw rugs and other things on the floor that can make you trip. What can I do in the kitchen?  Clean up any spills right away.  Avoid walking on wet floors.  Keep items that you use a lot in easy-to-reach places.  If you need to reach something above you, use a strong step stool that has a grab bar.  Keep electrical cords out of the way.  Do not use floor polish or wax that makes floors slippery. If you must  use wax, use non-skid floor wax.  Do not have throw rugs and other things on the floor that can make you trip. What can I do with my stairs?  Do not leave any items on the stairs.  Make sure that there are handrails on both sides of the stairs and use them. Fix handrails that are broken or loose. Make sure that handrails are as long as the stairways.  Check any carpeting to make sure that it is firmly attached to the stairs. Fix any carpet that is loose or worn.  Avoid having throw rugs at the top or bottom of the stairs. If you do have throw rugs, attach them to the floor with carpet tape.  Make sure that you have a light switch at the top of the stairs and the bottom of the stairs. If you do not have them, ask someone to add them for you. What else can I do to help prevent falls?  Wear shoes that:  Do not have high heels.  Have rubber bottoms.  Are comfortable and fit you well.  Are closed at the  toe. Do not wear sandals.  If you use a stepladder:  Make sure that it is fully opened. Do not climb a closed stepladder.  Make sure that both sides of the stepladder are locked into place.  Ask someone to hold it for you, if possible.  Clearly mark and make sure that you can see:  Any grab bars or handrails.  First and last steps.  Where the edge of each step is.  Use tools that help you move around (mobility aids) if they are needed. These include:  Canes.  Walkers.  Scooters.  Crutches.  Turn on the lights when you go into a dark area. Replace any light bulbs as soon as they burn out.  Set up your furniture so you have a clear path. Avoid moving your furniture around.  If any of your floors are uneven, fix them.  If there are any pets around you, be aware of where they are.  Review your medicines with your doctor. Some medicines can make you feel dizzy. This can increase your chance of falling. Ask your doctor what other things that you can do to help prevent falls. This information is not intended to replace advice given to you by your health care provider. Make sure you discuss any questions you have with your health care provider. Document Released: 08/21/2009 Document Revised: 04/01/2016 Document Reviewed: 11/29/2014 Elsevier Interactive Patient Education  2017 [place].   Pneumococcal Polysaccharide Vaccine: What You Need to Know 1. Why get vaccinated? Vaccination can protect older adults (and some children and younger adults) from pneumococcal disease. Pneumococcal disease is caused by bacteria that can spread from person to person through close contact. It can cause ear infections, and it can also lead to more serious infections of the:  Lungs (pneumonia),  Blood (bacteremia), and  Covering of the brain and spinal cord (meningitis). Meningitis can cause deafness and brain damage, and it can be fatal.  Anyone can get pneumococcal disease, but  children under 77 years of age, people with certain medical conditions, adults over 70 years of age, and cigarette smokers are at the highest risk. About 18,000 older adults die each year from pneumococcal disease in the [place]. Treatment of pneumococcal infections with penicillin and other drugs used to be more effective. But some strains of the disease have become resistant to these drugs. This makes prevention  of the disease, through vaccination, even more important. 2. Pneumococcal polysaccharide vaccine (PPSV23) Pneumococcal polysaccharide vaccine (PPSV23) protects against 23 types of pneumococcal bacteria. It will not prevent all pneumococcal disease. PPSV23 is recommended for:  All adults 82 years of age and older,  Anyone 2 through 66 years of age with certain long-term health problems,  Anyone 2 through 66 years of age with a weakened immune system,  Adults 42 through 66 years of age who smoke cigarettes or have asthma.  Most people need only one dose of PPSV. A second dose is recommended for certain high-risk groups. People 72 and older should get a dose even if they have gotten one or more doses of the vaccine before they turned 65. Your healthcare provider can give you more information about these recommendations. Most healthy adults develop protection within 2 to 3 weeks of getting the shot. 3. Some people should not get this vaccine  Anyone who has had a life-threatening allergic reaction to PPSV should not get another dose.  Anyone who has a severe allergy to any component of PPSV should not receive it. Tell your provider if you have any severe allergies.  Anyone who is moderately or severely ill when the shot is scheduled may be asked to wait until they recover before getting the vaccine. Someone with a mild illness can usually be vaccinated.  Children less than 26 years of age should not receive this vaccine.  There is no evidence that PPSV is harmful to either a  pregnant woman or to her fetus. However, as a precaution, women who need the vaccine should be vaccinated before becoming pregnant, if possible. 4. Risks of a vaccine reaction With any medicine, including vaccines, there is a chance of side effects. These are usually mild and go away on their own, but serious reactions are also possible. About half of people who get PPSV have mild side effects, such as redness or pain where the shot is given, which go away within about two days. Less than 1 out of 100 people develop a fever, muscle aches, or more severe local reactions. Problems that could happen after any vaccine:  People sometimes faint after a medical procedure, including vaccination. Sitting or lying down for about 15 minutes can help prevent fainting, and injuries caused by a fall. Tell your doctor if you feel dizzy, or have vision changes or ringing in the ears.  Some people get severe pain in the shoulder and have difficulty moving the arm where a shot was given. This happens very rarely.  Any medication can cause a severe allergic reaction. Such reactions from a vaccine are very rare, estimated at about 1 in a million doses, and would happen within a few minutes to a few hours after the vaccination. As with any medicine, there is a very remote chance of a vaccine causing a serious injury or death. The safety of vaccines is always being monitored. For more information, visit: http://www.aguilar.org/ 5. What if there is a serious reaction? What should I look for? Look for anything that concerns you, such as signs of a severe allergic reaction, very high fever, or unusual behavior. Signs of a severe allergic reaction can include hives, swelling of the face and throat, difficulty breathing, a fast heartbeat, dizziness, and weakness. These would usually start a few minutes to a few hours after the vaccination. What should I do? If you think it is a severe allergic reaction or other emergency  that can't wait, call 9-1-1  or get to the nearest hospital. Otherwise, call your doctor. Afterward, the reaction should be reported to the Vaccine Adverse Event Reporting System (VAERS). Your doctor might file this report, or you can do it yourself through the VAERS web site at www.vaers.SamedayNews.es, or by calling 838-161-1296. VAERS does not give medical advice. 6. How can I learn more?  Ask your doctor. He or she can give you the vaccine package insert or suggest other sources of information.  Call your local or state health department.  Contact the Centers for Disease Control and Prevention (CDC): ? Call 670-400-6717 (1-800-CDC-INFO) or ? Visit CDC's website at http://hunter.com/ CDC Pneumococcal Polysaccharide Vaccine VIS (03/01/14) This information is not intended to replace advice given to you by your health care provider. Make sure you discuss any questions you have with your health care provider. Document Released: 08/22/2006 Document Revised: 07/15/2016 Document Reviewed: 07/15/2016 Elsevier Interactive Patient Education  2017 [employer].

## 2017-10-27 NOTE — Progress Notes (Signed)
Subjective:   Mackenzi Krogh is a 66 y.o. female who presents for Medicare Annual (Subsequent) preventive examination.  Review of Systems:   Cardiac Risk Factors include: none;hypertension;dyslipidemia;advanced age (>4men, >76 women)     Objective:     Vitals: BP 140/88 (BP Location: Left Arm, Cuff Size: Normal)   Pulse (!) 103   Temp 98.2 F (36.8 C) (Oral)   Resp 16   Ht 5\' 8"  (1.727 m)   Wt 151 lb 8 oz (68.7 kg)   BMI 23.04 kg/m   Body mass index is 23.04 kg/m.  Advanced Directives 10/27/2017 10/21/2016 07/16/2016  Does Patient Have a Medical Advance Directive? No No No  Does patient want to make changes to medical advance directive? Yes (MAU/Ambulatory/Procedural Areas - Information given) - -  Would patient like information on creating a medical advance directive? - Yes (MAU/Ambulatory/Procedural Areas - Information given) No - patient declined information    Tobacco Social History   Tobacco Use  Smoking Status Never Smoker  Smokeless Tobacco Never Used     Counseling given: Not Answered   Clinical Intake:  Pre-visit preparation completed: Yes  Pain : No/denies pain     Nutritional Status: BMI of 19-24  Normal Nutritional Risks: None Diabetes: No  How often do you need to have someone help you when you read instructions, pamphlets, or other written materials from your doctor or pharmacy?: 1 - Never What is the last grade level you completed in school?: 1 year college   Interpreter Needed?: No  Information entered by :: Idonna Heeren,LPN   Past Medical History:  Diagnosis Date  . Allergy   . Ataxia   . GERD (gastroesophageal reflux disease)   . Hyperglycemia   . Hyperlipidemia   . Hypertension   . Insomnia   . Low back pain    Past Surgical History:  Procedure Laterality Date  . ABDOMINAL HYSTERECTOMY  1991   Complete, no cancer  . CATARACT EXTRACTION Left   . SHOULDER SURGERY Right 2015   Family History  Problem Relation Age of Onset   . Cancer Mother   . Hypertension Father   . Heart attack Father   . Diabetes Maternal Grandmother   . Cancer Paternal Grandmother        Stomach   Social History   Socioeconomic History  . Marital status: Single    Spouse name: None  . Number of children: None  . Years of education: 1 year college   . Highest education level: None  Social Needs  . Financial resource strain: Not hard at all  . Food insecurity - worry: Never true  . Food insecurity - inability: Never true  . Transportation needs - medical: No  . Transportation needs - non-medical: No  Occupational History  . None  Tobacco Use  . Smoking status: Never Smoker  . Smokeless tobacco: Never Used  Substance and Sexual Activity  . Alcohol use: Yes    Alcohol/week: 0.6 oz    Types: 1 Glasses of wine per week    Comment: occassionally  . Drug use: No  . Sexual activity: Not Currently  Other Topics Concern  . None  Social History Narrative  . None    Outpatient Encounter Medications as of 10/27/2017  Medication Sig  . b complex vitamins capsule Take 1 capsule by mouth daily.  . carbidopa-levodopa (SINEMET IR) 25-100 MG tablet   . COCONUT OIL PO Take by mouth.  . FIBER ADULT GUMMIES PO Take by  mouth.  . hydrochlorothiazide (HYDRODIURIL) 25 MG tablet Take 1 tablet (25 mg total) by mouth daily.  [name] losartan (COZAAR) 25 MG tablet Take 0.5 tablets (12.5 mg total) by mouth daily.  [name] lovastatin (MEVACOR) 40 MG tablet 1 and 1/2 tablets with dinner  . Magnesium 400 MG CAPS Take by mouth.  . Multiple Vitamin (MULTIVITAMIN) tablet Take 1 tablet by mouth daily.  . Omega-3 Fatty Acids (FISH OIL) 1000 MG CAPS Take 300 mg by mouth.   [name] OVER THE COUNTER MEDICATION VitaFusion Fiber Well, sugar free gummies with digestive health, regularity support and probiotic  . Vitamin D, Cholecalciferol, 1000 units CAPS Take 1,000 Units by mouth.  . [DISCONTINUED] selegiline (ELDEPRYL) 5 MG tablet TK 1 T PO  BID WITH MEALS WITH BREAKFAST  AND LUNCH   No facility-administered encounter medications on file as of 10/27/2017.     Activities of Daily Living In your present state of health, do you have any difficulty performing the following activities: 10/27/2017  Hearing? N  Vision? N  Difficulty concentrating or making decisions? N  Walking or climbing stairs? N  Dressing or bathing? N  Doing errands, shopping? N  Preparing Food and eating ? N  Using the Toilet? N  In the past six months, have you accidently leaked urine? N  Do you have problems with loss of bowel control? N  Managing your Medications? N  Managing your Finances? N  Housekeeping or managing your Housekeeping? N  Some recent data might be hidden    Patient Care Team: [name], DO as PCP - General (Family Medicine)    Assessment:   This is a routine wellness examination for [place].  Exercise Activities and Dietary recommendations Current Exercise Habits: Home exercise routine, Type of exercise: walking, Time (Minutes): 35, Frequency (Times/Week): 7, Weekly Exercise (Minutes/Week): 245, Intensity: Mild, Exercise limited by: None identified  Goals    None      Fall Risk Fall Risk  10/27/2017 10/21/2016 05/04/2016  Falls in the past year? Yes Yes No  Number falls in past yr: 1 1 -  Injury with Fall? No No -  Risk for fall due to : - Impaired balance/gait;Impaired mobility -  Follow up Falls prevention discussed Falls evaluation completed -   Is the patient's home free of loose throw rugs in walkways, pet beds, electrical cords, etc?   yes      Grab bars in the bathroom? yes      Handrails on the stairs?   yes      Adequate lighting?   yes  Timed Get Up and Go performed: completed in 8 seconds with no use of assistive devices, steady gait.  No intervention needed at this time.   depression Screen PHQ 2/9 Scores 10/27/2017 10/21/2016 05/04/2016  PHQ - 2 Score 0 1 0     Cognitive Function     6CIT Screen 10/27/2017 10/21/2016    What Year? 0 points 0 points  What month? 0 points 0 points  What time? 0 points 0 points  Count back from 20 0 points 0 points  Months in reverse 0 points 0 points  Repeat phrase 0 points 2 points  Total Score 0 2    Immunization History  Administered Date(s) Administered  . Influenza, High Dose Seasonal PF 09/03/2016, 09/07/2017  . Influenza,inj,Quad PF,6+ Mos 09/17/2015  . Influenza-Unspecified 08/10/2011, 07/21/2012, 07/23/2013  . Pneumococcal Conjugate-13 05/04/2016  . Pneumococcal Polysaccharide-23 10/27/2017  . Tdap 09/17/2015  . Zoster 07/06/2011  Qualifies for Shingles Vaccine? Up to date, discussed option for new shingrix vaccine  Screening Tests Health Maintenance  Topic Date Due  . COLON CANCER SCREENING ANNUAL FOBT  10/23/2017  . MAMMOGRAM  12/06/2017  . TETANUS/TDAP  09/16/2025  . INFLUENZA VACCINE  Completed  . DEXA SCAN  Completed  . PNA vac Low Risk Adult  Completed  . Hepatitis C Screening  Addressed    Cancer Screenings: Lung: Low Dose CT Chest recommended if Age 68-80 years, 30 pack-year currently smoking OR have quit w/in 15years. Patient does not qualify. Breast:  Up to date on Mammogram? Yes   Up to date of Bone Density/Dexa? Yes Colorectal: will order cologuard today   Additional Screenings:  Hepatitis B/HIV/Syphillis: not indicated Hepatitis C Screening: completed     Plan:    I have personally reviewed and addressed the Medicare Annual Wellness questionnaire and have noted the following in the patient's chart:  A. Medical and social history B. Use of alcohol, tobacco or illicit drugs  C. Current medications and supplements D. Functional ability and status E.  Nutritional status F.  Physical activity G. Advance directives H. List of other physicians I.  Hospitalizations, surgeries, and ER visits in previous 12 months J.  [place] such as hearing and vision if needed, cognitive and depression L. Referrals and  appointments   In addition, I have reviewed and discussed with patient certain preventive protocols, quality metrics, and best practice recommendations. A written personalized care plan for preventive services as well as general preventive health recommendations were provided to patient.   Signed,  Tyler Aas, LPN Nurse Health Advisor   Nurse Notes: none

## 2017-10-28 ENCOUNTER — Telehealth: Payer: Self-pay | Admitting: Family Medicine

## 2017-10-28 NOTE — Telephone Encounter (Signed)
Copied from [facility]. Topic: General - Other >> Oct 28, 2017  8:30 AM Cecelia Byars, NT wrote: Reason for CRM: Patient called and said her blood   pressure today was 118/76 when she checked it.

## 2017-10-28 NOTE — Telephone Encounter (Signed)
Noted  

## 2017-10-31 ENCOUNTER — Other Ambulatory Visit: Payer: Self-pay | Admitting: Family Medicine

## 2017-11-13 DIAGNOSIS — Z1212 Encounter for screening for malignant neoplasm of rectum: Secondary | ICD-10-CM | POA: Diagnosis not present

## 2017-11-13 DIAGNOSIS — Z1211 Encounter for screening for malignant neoplasm of colon: Secondary | ICD-10-CM | POA: Diagnosis not present

## 2017-11-13 LAB — COLOGUARD: Cologuard: NEGATIVE

## 2017-12-13 ENCOUNTER — Other Ambulatory Visit: Payer: Self-pay | Admitting: Family Medicine

## 2018-01-02 ENCOUNTER — Telehealth: Payer: Self-pay

## 2018-01-02 NOTE — Telephone Encounter (Signed)
Copied from [place] [id number] [phone]. Topic: Medicare AWV >> Jan 02, 2018 11:23 AM Bevelyn Ngo, LPN wrote: Reason for CRM: Called to schedule medicare annual wellness visit with [facility]- Chanee Henrickson,LPN at [facility]. Please schedule anytime after 10/16/2018 . Any questions please contact Joanette Gula on skype or by phone- [phone]

## 2018-01-05 DIAGNOSIS — R202 Paresthesia of skin: Secondary | ICD-10-CM | POA: Diagnosis not present

## 2018-01-05 DIAGNOSIS — G20 Parkinson's disease: Secondary | ICD-10-CM | POA: Diagnosis not present

## 2018-01-05 DIAGNOSIS — R200 Anesthesia of skin: Secondary | ICD-10-CM | POA: Diagnosis not present

## 2018-01-25 ENCOUNTER — Ambulatory Visit: Payer: Medicare Other | Admitting: Family Medicine

## 2018-01-27 ENCOUNTER — Ambulatory Visit (INDEPENDENT_AMBULATORY_CARE_PROVIDER_SITE_OTHER): Payer: Medicare Other | Admitting: Family Medicine

## 2018-01-27 ENCOUNTER — Encounter: Payer: Self-pay | Admitting: Family Medicine

## 2018-01-27 VITALS — BP 148/78 | HR 85 | Temp 98.6°F | Wt 155.1 lb

## 2018-01-27 DIAGNOSIS — Z0000 Encounter for general adult medical examination without abnormal findings: Secondary | ICD-10-CM | POA: Diagnosis not present

## 2018-01-27 DIAGNOSIS — Z1231 Encounter for screening mammogram for malignant neoplasm of breast: Secondary | ICD-10-CM | POA: Diagnosis not present

## 2018-01-27 DIAGNOSIS — E782 Mixed hyperlipidemia: Secondary | ICD-10-CM | POA: Diagnosis not present

## 2018-01-27 DIAGNOSIS — Z1239 Encounter for other screening for malignant neoplasm of breast: Secondary | ICD-10-CM

## 2018-01-27 DIAGNOSIS — G20 Parkinson's disease: Secondary | ICD-10-CM | POA: Diagnosis not present

## 2018-01-27 DIAGNOSIS — I10 Essential (primary) hypertension: Secondary | ICD-10-CM | POA: Diagnosis not present

## 2018-01-27 LAB — MICROALBUMIN, URINE WAIVED
CREATININE, URINE WAIVED: 50 mg/dL (ref 10–300)
Microalb, Ur Waived: 10 mg/L (ref 0–19)
Microalb/Creat Ratio: <30 mg/g (ref <30)

## 2018-01-27 LAB — UA/M W/RFLX CULTURE, ROUTINE
BILIRUBIN UA: NEGATIVE
Glucose, UA: NEGATIVE
Ketones, UA: NEGATIVE
Leukocytes, UA: NEGATIVE
Nitrite, UA: NEGATIVE
PROTEIN UA: NEGATIVE
Specific Gravity, UA: 1.015 (ref 1.005–1.030)
UUROB: 0.2 mg/dL (ref 0.2–1.0)
pH, UA: 7.5 (ref 5.0–7.5)

## 2018-01-27 LAB — MICROSCOPIC EXAMINATION

## 2018-01-27 MED ORDER — LOSARTAN POTASSIUM 25 MG PO TABS: 25.0000 mg | ORAL_TABLET | Freq: Every day | ORAL | 1 refills | Status: DC

## 2018-01-27 MED ORDER — HYDROCHLOROTHIAZIDE 25 MG PO TABS: ORAL_TABLET | ORAL | 1 refills | Status: DC

## 2018-01-27 MED ORDER — LOVASTATIN 40 MG PO TABS: ORAL_TABLET | ORAL | 1 refills | Status: DC

## 2018-01-27 NOTE — Assessment & Plan Note (Addendum)
Not under good control. Will increase his losartan to 25mg  and recheck 1 month. Continue to monitor. Call with any concerns.

## 2018-01-27 NOTE — Patient Instructions (Addendum)
Health Maintenance for Postmenopausal Women Menopause is a normal process in which your reproductive ability comes to an end. This process happens gradually over a span of months to years, usually between the ages of 48 and 55. Menopause is complete when you have missed 12 consecutive menstrual periods. It is important to talk with your health care provider about some of the most common conditions that affect postmenopausal women, such as heart disease, cancer, and bone loss (osteoporosis). Adopting a healthy lifestyle and getting preventive care can help to promote your health and wellness. Those actions can also lower your chances of developing some of these common conditions. What should I know about menopause? During menopause, you may experience a number of symptoms, such as:  Moderate-to-severe hot flashes.  Night sweats.  Decrease in sex drive.  Mood swings.  Headaches.  Tiredness.  Irritability.  Memory problems.  Insomnia.  Choosing to treat or not to treat menopausal changes is an individual decision that you make with your health care provider. What should I know about hormone replacement therapy and supplements? Hormone therapy products are effective for treating symptoms that are associated with menopause, such as hot flashes and night sweats. Hormone replacement carries certain risks, especially as you become older. If you are thinking about using estrogen or estrogen with progestin treatments, discuss the benefits and risks with your health care provider. What should I know about heart disease and stroke? Heart disease, heart attack, and stroke become more likely as you age. This may be due, in part, to the hormonal changes that your body experiences during menopause. These can affect how your body processes dietary fats, triglycerides, and cholesterol. Heart attack and stroke are both medical emergencies. There are many things that you can do to help prevent heart disease  and stroke:  Have your blood pressure checked at least every 1-2 years. High blood pressure causes heart disease and increases the risk of stroke.  If you are 55-79 years old, ask your health care provider if you should take aspirin to prevent a heart attack or a stroke.  Do not use any tobacco products, including cigarettes, chewing tobacco, or electronic cigarettes. If you need help quitting, ask your health care provider.  It is important to eat a healthy diet and maintain a healthy weight. ? Be sure to include plenty of vegetables, fruits, low-fat dairy products, and lean protein. ? Avoid eating foods that are high in solid fats, added sugars, or salt (sodium).  Get regular exercise. This is one of the most important things that you can do for your health. ? Try to exercise for at least 150 minutes each week. The type of exercise that you do should increase your heart rate and make you sweat. This is known as moderate-intensity exercise. ? Try to do strengthening exercises at least twice each week. Do these in addition to the moderate-intensity exercise.  Know your numbers.Ask your health care provider to check your cholesterol and your blood glucose. Continue to have your blood tested as directed by your health care provider.  What should I know about cancer screening? There are several types of cancer. Take the following steps to reduce your risk and to catch any cancer development as early as possible. Breast Cancer  Practice breast self-awareness. ? This means understanding how your breasts normally appear and feel. ? It also means doing regular breast self-exams. Let your health care provider know about any changes, no matter how small.  If you are 40   or older, have a clinician do a breast exam (clinical breast exam or CBE) every year. Depending on your age, family history, and medical history, it may be recommended that you also have a yearly breast X-ray (mammogram).  If you  have a family history of breast cancer, talk with your health care provider about genetic screening.  If you are at high risk for breast cancer, talk with your health care provider about having an MRI and a mammogram every year.  Breast cancer (BRCA) gene test is recommended for women who have family members with BRCA-related cancers. Results of the assessment will determine the need for genetic counseling and BRCA1 and for BRCA2 testing. BRCA-related cancers include these types: ? Breast. This occurs in males or females. ? Ovarian. ? Tubal. This may also be called fallopian tube cancer. ? Cancer of the abdominal or pelvic lining (peritoneal cancer). ? Prostate. ? Pancreatic.  Cervical, Uterine, and Ovarian Cancer Your health care provider may recommend that you be screened regularly for cancer of the pelvic organs. These include your ovaries, uterus, and vagina. This screening involves a pelvic exam, which includes checking for microscopic changes to the surface of your cervix (Pap test).  For women ages 21-65, health care providers may recommend a pelvic exam and a Pap test every three years. For women ages 30-65, they may recommend the Pap test and pelvic exam, combined with testing for human papilloma virus (HPV), every five years. Some types of HPV increase your risk of cervical cancer. Testing for HPV may also be done on women of any age who have unclear Pap test results.  Other health care providers may not recommend any screening for nonpregnant women who are considered low risk for pelvic cancer and have no symptoms. Ask your health care provider if a screening pelvic exam is right for you.  If you have had past treatment for cervical cancer or a condition that could lead to cancer, you need Pap tests and screening for cancer for at least 20 years after your treatment. If Pap tests have been discontinued for you, your risk factors (such as having a new sexual partner) need to be  reassessed to determine if you should start having screenings again. Some women have medical problems that increase the chance of getting cervical cancer. In these cases, your health care provider may recommend that you have screening and Pap tests more often.  If you have a family history of uterine cancer or ovarian cancer, talk with your health care provider about genetic screening.  If you have vaginal bleeding after reaching menopause, tell your health care provider.  There are currently no reliable tests available to screen for ovarian cancer.  Lung Cancer Lung cancer screening is recommended for adults 55-80 years old who are at high risk for lung cancer because of a history of smoking. A yearly low-dose CT scan of the lungs is recommended if you:  Currently smoke.  Have a history of at least 30 pack-years of smoking and you currently smoke or have quit within the past 15 years. A pack-year is smoking an average of one pack of cigarettes per day for one year.  Yearly screening should:  Continue until it has been 15 years since you quit.  Stop if you develop a health problem that would prevent you from having lung cancer treatment.  Colorectal Cancer  This type of cancer can be detected and can often be prevented.  Routine colorectal cancer screening usually begins at   age 50 and continues through age 75.  If you have risk factors for colon cancer, your health care provider may recommend that you be screened at an earlier age.  If you have a family history of colorectal cancer, talk with your health care provider about genetic screening.  Your health care provider may also recommend using home test kits to check for hidden blood in your stool.  A small camera at the end of a tube can be used to examine your colon directly (sigmoidoscopy or colonoscopy). This is done to check for the earliest forms of colorectal cancer.  Direct examination of the colon should be repeated every  5-10 years until age 75. However, if early forms of precancerous polyps or small growths are found or if you have a family history or genetic risk for colorectal cancer, you may need to be screened more often.  Skin Cancer  Check your skin from head to toe regularly.  Monitor any moles. Be sure to tell your health care provider: ? About any new moles or changes in moles, especially if there is a change in a mole's shape or color. ? If you have a mole that is larger than the size of a pencil eraser.  If any of your family members has a history of skin cancer, especially at a young age, talk with your health care provider about genetic screening.  Always use sunscreen. Apply sunscreen liberally and repeatedly throughout the day.  Whenever you are outside, protect yourself by wearing long sleeves, pants, a wide-brimmed hat, and sunglasses.  What should I know about osteoporosis? Osteoporosis is a condition in which bone destruction happens more quickly than new bone creation. After menopause, you may be at an increased risk for osteoporosis. To help prevent osteoporosis or the bone fractures that can happen because of osteoporosis, the following is recommended:  If you are 19-50 years old, get at least 1,000 mg of calcium and at least 600 mg of vitamin D per day.  If you are older than age 50 but younger than age 70, get at least 1,200 mg of calcium and at least 600 mg of vitamin D per day.  If you are older than age 70, get at least 1,200 mg of calcium and at least 800 mg of vitamin D per day.  Smoking and excessive alcohol intake increase the risk of osteoporosis. Eat foods that are rich in calcium and vitamin D, and do weight-bearing exercises several times each week as directed by your health care provider. What should I know about how menopause affects my mental health? Depression may occur at any age, but it is more common as you become older. Common symptoms of depression  include:  Low or sad mood.  Changes in sleep patterns.  Changes in appetite or eating patterns.  Feeling an overall lack of motivation or enjoyment of activities that you previously enjoyed.  Frequent crying spells.  Talk with your health care provider if you think that you are experiencing depression. What should I know about immunizations? It is important that you get and maintain your immunizations. These include:  Tetanus, diphtheria, and pertussis (Tdap) booster vaccine.  Influenza every year before the flu season begins.  Pneumonia vaccine.  Shingles vaccine.  Your health care provider may also recommend other immunizations. This information is not intended to replace advice given to you by your health care provider. Make sure you discuss any questions you have with your health care provider. Document Released: 12/17/2005   Document Revised: 05/14/2016 Document Reviewed: 07/29/2015 Elsevier Interactive Patient Education  2018 [employer]  

## 2018-01-27 NOTE — Progress Notes (Signed)
BP (!) 148/78 (BP Location: Left Arm, Cuff Size: Normal)   Pulse 85   Temp 98.6 F (37 C)   Wt 155 lb 1 oz (70.3 kg)   SpO2 100%   BMI 23.58 kg/m    Subjective:    Patient ID: Melissa Wise, female    DOB: Mar 22, 1951, 67 y.o.   MRN: [id number]  HPI: Melissa Wise is a 67 y.o. female presenting on 01/27/2018 for comprehensive medical examination. Current medical complaints include:  HYPERTENSION / HYPERLIPIDEMIA Satisfied with current treatment? yes Duration of hypertension: chronic BP monitoring frequency: not checking BP medication side effects: no Past BP meds: losartan, hctz Duration of hyperlipidemia: chronic Cholesterol medication side effects: no Cholesterol supplements: none Past cholesterol medications: lovastatin Medication compliance: excellent compliance Aspirin: no Recent stressors: no Recurrent headaches: no Visual changes: no Palpitations: no Dyspnea: no Chest pain: no Lower extremity edema: no Dizzy/lightheaded: no  Menopausal Symptoms: no  Depression Screen done today and results listed below:  Depression screen [facility] 2/9 01/27/2018 10/27/2017 10/21/2016 05/04/2016  Decreased Interest 1 0 0 0  Down, Depressed, Hopeless 0 0 1 0  PHQ - 2 Score 1 0 1 0  Altered sleeping 0 - - -  Tired, decreased energy 1 - - -  Change in appetite 0 - - -  Feeling bad or failure about yourself  1 - - -  Trouble concentrating 0 - - -  Moving slowly or fidgety/restless 0 - - -  Suicidal thoughts 0 - - -  PHQ-9 Score 3 - - -    Past Medical History:  Past Medical History:  Diagnosis Date  . Allergy   . Ataxia   . GERD (gastroesophageal reflux disease)   . Hyperglycemia   . Hyperlipidemia   . Hypertension   . Insomnia   . Low back pain     Surgical History:  Past Surgical History:  Procedure Laterality Date  . ABDOMINAL HYSTERECTOMY  1991   Complete, no cancer  . CATARACT EXTRACTION Left   . SHOULDER SURGERY Right 2015    Medications:  Current  Outpatient Medications on File Prior to Visit  Medication Sig  . b complex vitamins capsule Take 1 capsule by mouth daily.  . carbidopa-levodopa (SINEMET IR) 25-100 MG tablet   . COCONUT OIL PO Take by mouth.  . FIBER ADULT GUMMIES PO Take by mouth.  . Magnesium 400 MG CAPS Take by mouth.  . Multiple Vitamin (MULTIVITAMIN) tablet Take 1 tablet by mouth daily.  . Omega-3 Fatty Acids (FISH OIL) 1000 MG CAPS Take 300 mg by mouth.   [name] OVER THE COUNTER MEDICATION VitaFusion Fiber Well, sugar free gummies with digestive health, regularity support and probiotic  . TURMERIC PO Take by mouth.  . Vitamin D, Cholecalciferol, 1000 units CAPS Take 1,000 Units by mouth.   No current facility-administered medications on file prior to visit.     Allergies:  Allergies  Allergen Reactions  . Amlodipine Swelling  . Enalapril Cough    Social History:  Social History   Socioeconomic History  . Marital status: Single    Spouse name: Not on file  . Number of children: Not on file  . Years of education: 1 year college   . Highest education level: Not on file  Occupational History  . Not on file  Social Needs  . Financial resource strain: Not hard at all  . Food insecurity:    Worry: Never true    Inability: Never true  .  Transportation needs:    Medical: No    Non-medical: No  Tobacco Use  . Smoking status: Never Smoker  . Smokeless tobacco: Never Used  Substance and Sexual Activity  . Alcohol use: Yes    Alcohol/week: 0.6 oz    Types: 1 Glasses of wine per week    Comment: occassionally  . Drug use: No  . Sexual activity: Not Currently  Lifestyle  . Physical activity:    Days per week: 7 days    Minutes per session: 40 min  . Stress: Not at all  Relationships  . Social connections:    Talks on phone: More than three times a week    Gets together: Three times a week    Attends religious service: 1 to 4 times per year    Active member of club or organization: No    Attends  meetings of clubs or organizations: Never    Relationship status: Never married  . Intimate partner violence:    Fear of current or ex partner: No    Emotionally abused: No    Physically abused: No    Forced sexual activity: No  Other Topics Concern  . Not on file  Social History Narrative  . Not on file   Social History   Tobacco Use  Smoking Status Never Smoker  Smokeless Tobacco Never Used   Social History   Substance and Sexual Activity  Alcohol Use Yes  . Alcohol/week: 0.6 oz  . Types: 1 Glasses of wine per week   Comment: occassionally    Family History:  Family History  Problem Relation Age of Onset  . Cancer Mother   . Hypertension Father   . Heart attack Father   . Diabetes Maternal Grandmother   . Cancer Paternal Grandmother        Stomach    Past medical history, surgical history, medications, allergies, family history and social history reviewed with patient today and changes made to appropriate areas of the chart.   Review of Systems  Constitutional: Negative.   HENT: Negative.   Eyes: Negative.   Respiratory: Positive for cough. Negative for hemoptysis, sputum production, shortness of breath and wheezing.   Cardiovascular: Positive for leg swelling. Negative for chest pain, palpitations, orthopnea, claudication and PND.  Gastrointestinal: Negative.   Genitourinary: Negative.   Musculoskeletal: Negative.   Skin: Negative.   Neurological: Negative.   Endo/Heme/Allergies: Positive for environmental allergies. Negative for polydipsia. Bruises/bleeds easily.  Psychiatric/Behavioral: Negative.     All other ROS negative except what is listed above and in the HPI.      Objective:    BP (!) 148/78 (BP Location: Left Arm, Cuff Size: Normal)   Pulse 85   Temp 98.6 F (37 C)   Wt 155 lb 1 oz (70.3 kg)   SpO2 100%   BMI 23.58 kg/m   Wt Readings from Last 3 Encounters:  01/27/18 155 lb 1 oz (70.3 kg)  10/27/17 151 lb 8 oz (68.7 kg)  04/21/17 148  lb 8 oz (67.4 kg)    Physical Exam  Constitutional: She is oriented to person, place, and time. She appears well-developed and well-nourished. No distress.  HENT:  Head: Normocephalic and atraumatic.  Right Ear: Hearing, tympanic membrane, external ear and ear canal normal.  Left Ear: Hearing, tympanic membrane, external ear and ear canal normal.  Nose: Nose normal.  Mouth/Throat: Uvula is midline, oropharynx is clear and moist and mucous membranes are normal. No oropharyngeal exudate.  Eyes:  Pupils are equal, round, and reactive to light. Conjunctivae, EOM and lids are normal. Right eye exhibits no discharge. Left eye exhibits no discharge. No scleral icterus.  Neck: Normal range of motion. Neck supple. No JVD present. No tracheal deviation present. No thyromegaly present.  Cardiovascular: Normal rate, regular rhythm, normal heart sounds and intact distal pulses. Exam reveals no gallop and no friction rub.  No murmur heard. Pulmonary/Chest: Effort normal and breath sounds normal. No stridor. No respiratory distress. She has no wheezes. She has no rales. She exhibits no tenderness.  Abdominal: Soft. Bowel sounds are normal. She exhibits no distension and no mass. There is no tenderness. There is no rebound and no guarding.  Genitourinary:  Genitourinary Comments: Breast and pelvic exams deferred with shared decision making  Musculoskeletal: Normal range of motion. She exhibits no edema, tenderness or deformity.  Lymphadenopathy:    She has no cervical adenopathy.  Neurological: She is alert and oriented to person, place, and time. She has normal reflexes. She displays normal reflexes. No cranial nerve deficit. She exhibits normal muscle tone. Coordination normal.  Skin: Skin is warm, dry and intact. No rash noted. She is not diaphoretic. There is erythema (red faced). No pallor.  Psychiatric: Her speech is normal and behavior is normal. Judgment and thought content normal. Her affect is blunt.  Cognition and memory are normal.  Nursing note and vitals reviewed.   Results for orders placed or performed in visit on 11/30/17  Cologuard  Result Value Ref Range   Cologuard Negative       Assessment & Plan:   Problem List Items Addressed This Visit      Cardiovascular and Mediastinum   Hypertension    Not under good control. Will increase his losartan to 25mg  and recheck 1 month. Continue to monitor. Call with any concerns.       Relevant Medications   losartan (COZAAR) 25 MG tablet   lovastatin (MEVACOR) 40 MG tablet   hydrochlorothiazide (HYDRODIURIL) 25 MG tablet   Other Relevant Orders   CBC with Differential/Platelet   Comprehensive metabolic panel   Microalbumin, Urine Waived   TSH   UA/M w/rflx Culture, Routine     Nervous and Auditory   Parkinson disease (HCC)    Stable. Continue to follow with neurology. Call with any concerns.       Relevant Orders   CBC with Differential/Platelet   Comprehensive metabolic panel   TSH   UA/M w/rflx Culture, Routine     Other   Hyperlipidemia    Under good control. Continue current regimen. Continue to monitor. Call with any concerns.       Relevant Medications   losartan (COZAAR) 25 MG tablet   lovastatin (MEVACOR) 40 MG tablet   hydrochlorothiazide (HYDRODIURIL) 25 MG tablet   Other Relevant Orders   CBC with Differential/Platelet   Comprehensive metabolic panel   Lipid Panel w/o Chol/HDL Ratio   TSH   UA/M w/rflx Culture, Routine    Other Visit Diagnoses    Routine general medical examination at a health care facility    -  Primary   Vaccines up to date. Screening labs checked today. Cologuard up to date. Mammogram orderd. DEXA up to date. Continue diet and exercise. Call with any concerns.    Screening for breast cancer       Mammogram order in- encouraged her to get it.       Follow up plan: Return in about 1 month (around 02/24/2018) for follow  up BP.   LABORATORY TESTING:  - Pap smear: not  applicable  IMMUNIZATIONS:   - Tdap: Tetanus vaccination status reviewed: last tetanus booster within 10 years. - Influenza: Up to date - Pneumovax: Up to date - Prevnar: Up to date - Zostavax vaccine: Up to date  SCREENING: -Mammogram: Ordered today  - Colonoscopy: Up to date  - Bone Density: Up to date   PATIENT COUNSELING:   Advised to take 1 mg of folate supplement per day if capable of pregnancy.   Sexuality: Discussed sexually transmitted diseases, partner selection, use of condoms, avoidance of unintended pregnancy  and contraceptive alternatives.   Advised to avoid cigarette smoking.  I discussed with the patient that most people either abstain from alcohol or drink within safe limits (<=14/week and <=4 drinks/occasion for males, <=7/weeks and <= 3 drinks/occasion for females) and that the risk for alcohol disorders and other health effects rises proportionally with the number of drinks per week and how often a drinker exceeds daily limits.  Discussed cessation/primary prevention of drug use and availability of treatment for abuse.   Diet: Encouraged to adjust caloric intake to maintain  or achieve ideal body weight, to reduce intake of dietary saturated fat and total fat, to limit sodium intake by avoiding high sodium foods and not adding table salt, and to maintain adequate dietary potassium and calcium preferably from fresh fruits, vegetables, and low-fat dairy products.    stressed the importance of regular exercise  Injury prevention: Discussed safety belts, safety helmets, smoke detector, smoking near bedding or upholstery.   Dental health: Discussed importance of regular tooth brushing, flossing, and dental visits.    NEXT PREVENTATIVE PHYSICAL DUE IN 1 YEAR. Return in about 1 month (around 02/24/2018) for follow up BP.

## 2018-01-27 NOTE — Assessment & Plan Note (Signed)
Under good control. Continue current regimen. Continue to monitor. Call with any concerns. 

## 2018-01-27 NOTE — Assessment & Plan Note (Signed)
Stable. Continue to follow with neurology. Call with any concerns.  

## 2018-01-28 LAB — COMPREHENSIVE METABOLIC PANEL
A/G RATIO: 2 (ref 1.2–2.2)
ALT: 12 IU/L (ref 0–32)
AST: 17 IU/L (ref 0–40)
Albumin: 4.6 g/dL (ref 3.6–4.8)
Alkaline Phosphatase: 61 IU/L (ref 39–117)
BILIRUBIN TOTAL: 0.5 mg/dL (ref 0.0–1.2)
BUN/Creatinine Ratio: 17 (ref 12–28)
BUN: 11 mg/dL (ref 8–27)
CALCIUM: 9.8 mg/dL (ref 8.7–10.3)
CHLORIDE: 93 mmol/L — AB (ref 96–106)
CO2: 29 mmol/L (ref 20–29)
Creatinine, Ser: 0.63 mg/dL (ref 0.57–1.00)
GFR calc Af Amer: 108 mL/min/{1.73_m2} (ref >59)
GFR, EST NON AFRICAN AMERICAN: 94 mL/min/{1.73_m2} (ref >59)
GLUCOSE: 91 mg/dL (ref 65–99)
Globulin, Total: 2.3 g/dL (ref 1.5–4.5)
POTASSIUM: 4.1 mmol/L (ref 3.5–5.2)
Sodium: 135 mmol/L (ref 134–144)
TOTAL PROTEIN: 6.9 g/dL (ref 6.0–8.5)

## 2018-01-28 LAB — CBC WITH DIFFERENTIAL/PLATELET
BASOS ABS: 0 10*3/uL (ref 0.0–0.2)
BASOS: 0 %
EOS (ABSOLUTE): 0 10*3/uL (ref 0.0–0.4)
Eos: 0 %
Hematocrit: 40.8 % (ref 34.0–46.6)
Hemoglobin: 13.9 g/dL (ref 11.1–15.9)
IMMATURE GRANS (ABS): 0 10*3/uL (ref 0.0–0.1)
IMMATURE GRANULOCYTES: 0 %
LYMPHS: 29 %
Lymphocytes Absolute: 1.6 10*3/uL (ref 0.7–3.1)
MCH: 29.3 pg (ref 26.6–33.0)
MCHC: 34.1 g/dL (ref 31.5–35.7)
MCV: 86 fL (ref 79–97)
MONOS ABS: 0.4 10*3/uL (ref 0.1–0.9)
Monocytes: 8 %
NEUTROS PCT: 63 %
Neutrophils Absolute: 3.4 10*3/uL (ref 1.4–7.0)
PLATELETS: 207 10*3/uL (ref 150–379)
RBC: 4.74 x10E6/uL (ref 3.77–5.28)
RDW: 14.3 % (ref 12.3–15.4)
WBC: 5.5 10*3/uL (ref 3.4–10.8)

## 2018-01-28 LAB — TSH: TSH: 1.03 u[IU]/mL (ref 0.450–4.500)

## 2018-01-28 LAB — LIPID PANEL W/O CHOL/HDL RATIO
Cholesterol, Total: 150 mg/dL (ref 100–199)
HDL: 64 mg/dL (ref >39)
LDL Calculated: 65 mg/dL (ref 0–99)
TRIGLYCERIDES: 107 mg/dL (ref 0–149)
VLDL CHOLESTEROL CAL: 21 mg/dL (ref 5–40)

## 2018-02-14 DIAGNOSIS — Z780 Asymptomatic menopausal state: Secondary | ICD-10-CM | POA: Diagnosis not present

## 2018-02-14 DIAGNOSIS — Z1231 Encounter for screening mammogram for malignant neoplasm of breast: Secondary | ICD-10-CM | POA: Diagnosis not present

## 2018-02-14 LAB — HM MAMMOGRAPHY

## 2018-02-16 ENCOUNTER — Encounter: Payer: Self-pay | Admitting: Family Medicine

## 2018-02-27 ENCOUNTER — Encounter: Payer: Self-pay | Admitting: Family Medicine

## 2018-02-27 ENCOUNTER — Ambulatory Visit (INDEPENDENT_AMBULATORY_CARE_PROVIDER_SITE_OTHER): Payer: Medicare Other | Admitting: Family Medicine

## 2018-02-27 VITALS — BP 136/72 | HR 90 | Temp 98.0°F | Wt 157.0 lb

## 2018-02-27 DIAGNOSIS — I10 Essential (primary) hypertension: Secondary | ICD-10-CM | POA: Diagnosis not present

## 2018-02-27 NOTE — Assessment & Plan Note (Signed)
Under good control. Continue current regimen. Continue to monitor. Call with any concerns. 

## 2018-02-27 NOTE — Progress Notes (Signed)
   BP 136/72 (BP Location: Left Arm, Cuff Size: Normal)   Pulse 90   Temp 98 F (36.7 C)   Wt 157 lb (71.2 kg)   SpO2 99%   BMI 23.87 kg/m    Subjective:    Patient ID: Melissa Wise, female    DOB: 23-Jun-1951, 67 y.o.   MRN: [id number]  HPI: Melissa Wise is a 67 y.o. female  Chief Complaint  Patient presents with  . Hypertension   HYPERTENSION Hypertension status: better  Satisfied with current treatment? yes Duration of hypertension: chronic BP monitoring frequency:  a few times a day BP range: 110s/70s-140s/80s, usually in the 11s-120s BP medication side effects:  no Medication compliance: excellent compliance Previous BP meds: losartan, hctz Aspirin: no Recurrent headaches: no Visual changes: no Palpitations: no Dyspnea: no Chest pain: no Lower extremity edema: no Dizzy/lightheaded: no  Relevant past medical, surgical, family and social history reviewed and updated as indicated. Interim medical history since our last visit reviewed. Allergies and medications reviewed and updated.  Review of Systems  Constitutional: Negative.   Respiratory: Negative.   Cardiovascular: Negative.   Psychiatric/Behavioral: Negative.     Per HPI unless specifically indicated above     Objective:    BP 136/72 (BP Location: Left Arm, Cuff Size: Normal)   Pulse 90   Temp 98 F (36.7 C)   Wt 157 lb (71.2 kg)   SpO2 99%   BMI 23.87 kg/m   Wt Readings from Last 3 Encounters:  02/27/18 157 lb (71.2 kg)  01/27/18 155 lb 1 oz (70.3 kg)  10/27/17 151 lb 8 oz (68.7 kg)    Physical Exam  Constitutional: She is oriented to person, place, and time. She appears well-developed and well-nourished. No distress.  HENT:  Head: Normocephalic and atraumatic.  Right Ear: Hearing normal.  Left Ear: Hearing normal.  Nose: Nose normal.  Eyes: Conjunctivae and lids are normal. Right eye exhibits no discharge. Left eye exhibits no discharge. No scleral icterus.  Cardiovascular: Normal  rate, regular rhythm, normal heart sounds and intact distal pulses. Exam reveals no gallop and no friction rub.  No murmur heard. Pulmonary/Chest: Effort normal and breath sounds normal. No stridor. No respiratory distress. She has no wheezes. She has no rales. She exhibits no tenderness.  Musculoskeletal: Normal range of motion.  Neurological: She is alert and oriented to person, place, and time.  Skin: Skin is warm, dry and intact. Capillary refill takes less than 2 seconds. No rash noted. She is not diaphoretic. No erythema. No pallor.  Psychiatric: She has a normal mood and affect. Her speech is normal and behavior is normal. Judgment and thought content normal. Cognition and memory are normal.  Nursing note and vitals reviewed.   Results for orders placed or performed in visit on 02/16/18  HM MAMMOGRAPHY  Result Value Ref Range   HM Mammogram 0-4 Bi-Rad 0-4 Bi-Rad, Self Reported Normal      Assessment & Plan:   Problem List Items Addressed This Visit      Cardiovascular and Mediastinum   Hypertension - Primary    Under good control. Continue current regimen. Continue to monitor. Call with any concerns.       Relevant Orders   Basic metabolic panel       Follow up plan: Return in about 5 months (around 07/30/2018) for 6 month follow up.

## 2018-02-28 ENCOUNTER — Encounter: Payer: Self-pay | Admitting: Family Medicine

## 2018-02-28 LAB — BASIC METABOLIC PANEL
BUN/Creatinine Ratio: 17 (ref 12–28)
BUN: 11 mg/dL (ref 8–27)
CALCIUM: 9.4 mg/dL (ref 8.7–10.3)
CHLORIDE: 89 mmol/L — AB (ref 96–106)
CO2: 26 mmol/L (ref 20–29)
Creatinine, Ser: 0.64 mg/dL (ref 0.57–1.00)
GFR calc Af Amer: 108 mL/min/{1.73_m2} (ref >59)
GFR, EST NON AFRICAN AMERICAN: 93 mL/min/{1.73_m2} (ref >59)
Glucose: 79 mg/dL (ref 65–99)
POTASSIUM: 3.9 mmol/L (ref 3.5–5.2)
SODIUM: 134 mmol/L (ref 134–144)

## 2018-04-10 ENCOUNTER — Encounter

## 2018-04-10 ENCOUNTER — Ambulatory Visit: Payer: Medicare Other | Admitting: Unknown Physician Specialty

## 2018-05-04 ENCOUNTER — Ambulatory Visit
Admission: RE | Admit: 2018-05-04 | Discharge: 2018-05-04 | Disposition: A | Discharge status: Home / Self Care | Payer: Medicare Other | Source: Ambulatory Visit | Attending: Physician Assistant | Admitting: Physician Assistant

## 2018-05-04 ENCOUNTER — Ambulatory Visit (INDEPENDENT_AMBULATORY_CARE_PROVIDER_SITE_OTHER): Payer: Medicare Other | Admitting: Physician Assistant

## 2018-05-04 ENCOUNTER — Encounter: Payer: Self-pay | Admitting: Physician Assistant

## 2018-05-04 VITALS — BP 144/83 | HR 85 | Temp 98.3°F | Ht 67.0 in | Wt 153.9 lb

## 2018-05-04 DIAGNOSIS — W19XXXA Unspecified fall, initial encounter: Secondary | ICD-10-CM | POA: Diagnosis not present

## 2018-05-04 DIAGNOSIS — M79671 Pain in right foot: Secondary | ICD-10-CM

## 2018-05-04 DIAGNOSIS — M25571 Pain in right ankle and joints of right foot: Secondary | ICD-10-CM | POA: Diagnosis not present

## 2018-05-04 NOTE — Patient Instructions (Signed)
Ankle Fracture A fracture is a break in a bone. A cast or splint may be used to protect the ankle and heal the break. Sometimes, surgery is needed. Follow these instructions at home:  Use crutches as told by your doctor. It is very important that you use your crutches correctly.  Do not put weight or pressure on the injured ankle until told by your doctor.  Keep your ankle raised (elevated) when sitting or lying down.  Apply ice to the ankle: ? Put ice in a plastic bag. ? Place a towel between your cast and the bag. ? Leave the ice on for 20 minutes, 2-3 times a day.  If you have a plaster or fiberglass cast: ? Do not try to scratch under the cast with any objects. ? Check the skin around the cast every day. You may put lotion on red or sore areas. ? Keep your cast dry and clean.  If you have a plaster splint: ? Wear the splint as told by your doctor. ? You can loosen the elastic around the splint if your toes get numb, tingle, or turn cold or blue.  Do not put pressure on any part of your cast or splint. It may break. Rest your plaster splint or cast only on a pillow the first 24 hours until it is fully hardened.  Cover your cast or splint with a plastic bag during showers.  Do not lower your cast or splint into water.  Take medicine as told by your doctor.  Do not drive until your doctor says it is safe.  Follow-up with your doctor as told. It is very important that you go to your follow-up visits. Contact a doctor if: The swelling and discomfort gets worse. Get help right away if:  Your splint or cast breaks.  You continue to have very bad pain.  You have new pain or swelling after your splint or cast was put on.  Your skin or toes below the injured ankle: ? Turn blue or gray. ? Feel cold, numb, or you cannot feel them.  There is a bad smell or yellowish white fluid (pus) coming from under the splint or cast. This information is not intended to replace advice  given to you by your health care provider. Make sure you discuss any questions you have with your health care provider. Document Released: 08/22/2009 Document Revised: 04/01/2016 Document Reviewed: 05/24/2013 Elsevier Interactive Patient Education  2017 Elsevier Inc.  

## 2018-05-04 NOTE — Progress Notes (Signed)
   Subjective:    Patient ID: Melissa Wise, female    DOB: 07-30-1951, 67 y.o.   MRN: [id number]  Melissa Wise is a 67 y.o. female presenting on 05/04/2018 for Foot Pain (Right Foot Pain. Sister states patient fell back in May.)   HPI   Hx of Parkinson's. No history of osteoporosis, no history of smoking. [place] about 1 mo ago and had twisting injury of right ankle. Now hurts over medial dorsal aspect. Was able to put weight on foot after time of injury. Notices some swelling at the end of the day. A little bit of worsening pain with activity that resolves with rest, elevation, ICE.  Social History   Tobacco Use  . Smoking status: Never Smoker  . Smokeless tobacco: Never Used  Substance Use Topics  . Alcohol use: Yes    Alcohol/week: 0.6 oz    Types: 1 Glasses of wine per week    Comment: occassionally  . Drug use: No    Review of Systems Per HPI unless specifically indicated above     Objective:    BP (!) 144/83 (BP Location: Right Arm, Patient Position: Sitting, Cuff Size: Normal)   Pulse 85   Temp 98.3 F (36.8 C) (Oral)   Ht 5\' 7"  (1.702 m)   Wt 153 lb 14.4 oz (69.8 kg)   SpO2 99%   BMI 24.10 kg/m   Wt Readings from Last 3 Encounters:  05/04/18 153 lb 14.4 oz (69.8 kg)  02/27/18 157 lb (71.2 kg)  01/27/18 155 lb 1 oz (70.3 kg)    Physical Exam  Constitutional: She is oriented to person, place, and time. She appears well-developed and well-nourished.  Musculoskeletal: Normal range of motion. She exhibits no edema or tenderness.       Right ankle: Normal. She exhibits normal range of motion, no swelling, no ecchymosis, no deformity, no laceration and normal pulse. No tenderness.       Left ankle: Normal. She exhibits normal range of motion, no swelling, no ecchymosis, no deformity, no laceration and normal pulse. No tenderness.  Neurological: She is alert and oriented to person, place, and time.   Results for orders placed or performed in visit on 86/76/72    Basic metabolic panel  Result Value Ref Range   Glucose 79 65 - 99 mg/dL   BUN 11 8 - 27 mg/dL   Creatinine, Ser 0.64 0.57 - 1.00 mg/dL   GFR calc non Af Amer 93 >59 mL/min/1.73   GFR calc Af Amer 108 >59 mL/min/1.73   BUN/Creatinine Ratio 17 12 - 28   Sodium 134 134 - 144 mmol/L   Potassium 3.9 3.5 - 5.2 mmol/L   Chloride 89 (L) 96 - 106 mmol/L   CO2 26 20 - 29 mmol/L   Calcium 9.4 8.7 - 10.3 mg/dL      Assessment & Plan:  1. Right foot pain  Exam is benign today. Likely soft tissue injury, sprain. If xrays normal, activity as tolerated, RICE.  - DG Foot Complete Right; Future - DG Ankle Complete Right; Future  2. Fall, initial encounter  - DG Foot Complete Right; Future - DG Ankle Complete Right; Future    Follow up plan: Return if symptoms worsen or fail to improve.  Carles Collet, PA-C [facility] 05/05/2018, 2:23 PM

## 2018-05-17 ENCOUNTER — Telehealth: Payer: Self-pay | Admitting: Family Medicine

## 2018-05-17 DIAGNOSIS — M79671 Pain in right foot: Secondary | ICD-10-CM

## 2018-05-17 DIAGNOSIS — W19XXXA Unspecified fall, initial encounter: Secondary | ICD-10-CM

## 2018-05-17 DIAGNOSIS — G20 Parkinson's disease: Secondary | ICD-10-CM

## 2018-05-17 NOTE — Telephone Encounter (Signed)
OK to give verbal orders for [facility] for PT

## 2018-05-17 NOTE — Telephone Encounter (Signed)
Order written, will get a signature and fax to [facility].

## 2018-05-17 NOTE — Telephone Encounter (Signed)
Copied from [place] [id number]. Topic: General - Other >> May 17, 2018  9:31 AM Valla Leaver wrote: Reason for CRM: Patient is requesting Dr. Wynetta Emery to fax [facility] an order/approval for rehab on her foot. [phone] ATTN: Referral Dept. Please call pt if any questions.

## 2018-05-17 NOTE — Telephone Encounter (Signed)
Can you see if she Kennan? If so I'm OK to do that.

## 2018-05-17 NOTE — Telephone Encounter (Signed)
Patient would like to have a referral for physical therapy oh her foot and  she is also having difficulty walking so she would like to have PT for that.

## 2018-05-19 NOTE — Telephone Encounter (Signed)
Pt said [facility] does not have the referral

## 2018-05-19 NOTE — Telephone Encounter (Signed)
Can we check on this? It should have been faxed yesterday.

## 2018-05-22 NOTE — Telephone Encounter (Signed)
Patient is calling and states [facility] is out of network with her insurance, she states her insurance covers, [employer] [facility] phone# [phone], [facility] phone # [phone], and [facility] phone # [phone].  Please advise.

## 2018-05-22 NOTE — Addendum Note (Signed)
Addended by: Sandria Manly on: 05/22/2018 10:43 AM   Modules accepted: Orders

## 2018-05-22 NOTE — Telephone Encounter (Signed)
Referral sent to [facility] and [place] with [facility] notified.

## 2018-05-22 NOTE — Telephone Encounter (Signed)
Order entered in Epic.

## 2018-05-24 DIAGNOSIS — G20 Parkinson's disease: Secondary | ICD-10-CM | POA: Diagnosis not present

## 2018-05-24 DIAGNOSIS — Z9181 History of falling: Secondary | ICD-10-CM | POA: Diagnosis not present

## 2018-05-24 DIAGNOSIS — M79671 Pain in right foot: Secondary | ICD-10-CM | POA: Diagnosis not present

## 2018-05-24 DIAGNOSIS — G8911 Acute pain due to trauma: Secondary | ICD-10-CM | POA: Diagnosis not present

## 2018-05-30 DIAGNOSIS — G20 Parkinson's disease: Secondary | ICD-10-CM | POA: Diagnosis not present

## 2018-05-30 DIAGNOSIS — Z9181 History of falling: Secondary | ICD-10-CM | POA: Diagnosis not present

## 2018-05-30 DIAGNOSIS — M79671 Pain in right foot: Secondary | ICD-10-CM | POA: Diagnosis not present

## 2018-05-30 DIAGNOSIS — G8911 Acute pain due to trauma: Secondary | ICD-10-CM | POA: Diagnosis not present

## 2018-06-01 DIAGNOSIS — Z9181 History of falling: Secondary | ICD-10-CM | POA: Diagnosis not present

## 2018-06-01 DIAGNOSIS — G8911 Acute pain due to trauma: Secondary | ICD-10-CM | POA: Diagnosis not present

## 2018-06-01 DIAGNOSIS — G20 Parkinson's disease: Secondary | ICD-10-CM | POA: Diagnosis not present

## 2018-06-01 DIAGNOSIS — M79671 Pain in right foot: Secondary | ICD-10-CM | POA: Diagnosis not present

## 2018-06-05 DIAGNOSIS — G8911 Acute pain due to trauma: Secondary | ICD-10-CM | POA: Diagnosis not present

## 2018-06-05 DIAGNOSIS — G20 Parkinson's disease: Secondary | ICD-10-CM | POA: Diagnosis not present

## 2018-06-05 DIAGNOSIS — M79671 Pain in right foot: Secondary | ICD-10-CM | POA: Diagnosis not present

## 2018-06-05 DIAGNOSIS — Z9181 History of falling: Secondary | ICD-10-CM | POA: Diagnosis not present

## 2018-06-07 DIAGNOSIS — G20 Parkinson's disease: Secondary | ICD-10-CM | POA: Diagnosis not present

## 2018-06-07 DIAGNOSIS — M79671 Pain in right foot: Secondary | ICD-10-CM | POA: Diagnosis not present

## 2018-06-07 DIAGNOSIS — Z9181 History of falling: Secondary | ICD-10-CM | POA: Diagnosis not present

## 2018-06-07 DIAGNOSIS — G8911 Acute pain due to trauma: Secondary | ICD-10-CM | POA: Diagnosis not present

## 2018-06-12 DIAGNOSIS — M79671 Pain in right foot: Secondary | ICD-10-CM | POA: Diagnosis not present

## 2018-06-12 DIAGNOSIS — Z9181 History of falling: Secondary | ICD-10-CM | POA: Diagnosis not present

## 2018-06-12 DIAGNOSIS — G20 Parkinson's disease: Secondary | ICD-10-CM | POA: Diagnosis not present

## 2018-06-12 DIAGNOSIS — G8911 Acute pain due to trauma: Secondary | ICD-10-CM | POA: Diagnosis not present

## 2018-06-14 DIAGNOSIS — G20 Parkinson's disease: Secondary | ICD-10-CM | POA: Diagnosis not present

## 2018-06-14 DIAGNOSIS — Z9181 History of falling: Secondary | ICD-10-CM | POA: Diagnosis not present

## 2018-06-14 DIAGNOSIS — M79671 Pain in right foot: Secondary | ICD-10-CM | POA: Diagnosis not present

## 2018-06-14 DIAGNOSIS — G8911 Acute pain due to trauma: Secondary | ICD-10-CM | POA: Diagnosis not present

## 2018-06-17 NOTE — Telephone Encounter (Signed)
Will call to sched for Dec 2019

## 2018-06-19 DIAGNOSIS — G20 Parkinson's disease: Secondary | ICD-10-CM | POA: Diagnosis not present

## 2018-06-19 DIAGNOSIS — M79671 Pain in right foot: Secondary | ICD-10-CM | POA: Diagnosis not present

## 2018-06-19 DIAGNOSIS — G8911 Acute pain due to trauma: Secondary | ICD-10-CM | POA: Diagnosis not present

## 2018-06-19 DIAGNOSIS — Z9181 History of falling: Secondary | ICD-10-CM | POA: Diagnosis not present

## 2018-06-21 DIAGNOSIS — G20 Parkinson's disease: Secondary | ICD-10-CM | POA: Diagnosis not present

## 2018-06-21 DIAGNOSIS — G8911 Acute pain due to trauma: Secondary | ICD-10-CM | POA: Diagnosis not present

## 2018-06-21 DIAGNOSIS — Z9181 History of falling: Secondary | ICD-10-CM | POA: Diagnosis not present

## 2018-06-21 DIAGNOSIS — M79671 Pain in right foot: Secondary | ICD-10-CM | POA: Diagnosis not present

## 2018-06-26 DIAGNOSIS — G8911 Acute pain due to trauma: Secondary | ICD-10-CM | POA: Diagnosis not present

## 2018-06-26 DIAGNOSIS — M79671 Pain in right foot: Secondary | ICD-10-CM | POA: Diagnosis not present

## 2018-06-26 DIAGNOSIS — Z9181 History of falling: Secondary | ICD-10-CM | POA: Diagnosis not present

## 2018-06-26 DIAGNOSIS — G20 Parkinson's disease: Secondary | ICD-10-CM | POA: Diagnosis not present

## 2018-06-28 DIAGNOSIS — G20 Parkinson's disease: Secondary | ICD-10-CM | POA: Diagnosis not present

## 2018-06-28 DIAGNOSIS — Z9181 History of falling: Secondary | ICD-10-CM | POA: Diagnosis not present

## 2018-06-28 DIAGNOSIS — M79671 Pain in right foot: Secondary | ICD-10-CM | POA: Diagnosis not present

## 2018-06-28 DIAGNOSIS — G8911 Acute pain due to trauma: Secondary | ICD-10-CM | POA: Diagnosis not present

## 2018-07-03 DIAGNOSIS — G20 Parkinson's disease: Secondary | ICD-10-CM | POA: Diagnosis not present

## 2018-07-03 DIAGNOSIS — Z9181 History of falling: Secondary | ICD-10-CM | POA: Diagnosis not present

## 2018-07-03 DIAGNOSIS — M79671 Pain in right foot: Secondary | ICD-10-CM | POA: Diagnosis not present

## 2018-07-03 DIAGNOSIS — G8911 Acute pain due to trauma: Secondary | ICD-10-CM | POA: Diagnosis not present

## 2018-07-05 ENCOUNTER — Telehealth: Payer: Self-pay | Admitting: Family Medicine

## 2018-07-05 DIAGNOSIS — G20 Parkinson's disease: Secondary | ICD-10-CM | POA: Diagnosis not present

## 2018-07-05 DIAGNOSIS — G8911 Acute pain due to trauma: Secondary | ICD-10-CM | POA: Diagnosis not present

## 2018-07-05 DIAGNOSIS — M79671 Pain in right foot: Secondary | ICD-10-CM | POA: Diagnosis not present

## 2018-07-05 DIAGNOSIS — Z9181 History of falling: Secondary | ICD-10-CM | POA: Diagnosis not present

## 2018-07-05 NOTE — Telephone Encounter (Signed)
OK to give verbal orders 

## 2018-07-05 NOTE — Telephone Encounter (Signed)
Called and let Melissa Wise know that Dr. Wynetta Emery said the verbal orders were ok.

## 2018-07-05 NOTE — Telephone Encounter (Signed)
Copied from [place] [phone]. Topic: General - Other >> Jul 05, 2018 10:54 AM Yvette Rack wrote: Reason for CRM: Benjamine Mola with [facility] request verbal orders for physical therapy for 2 times week for 2 weeks. Cb# [phone]

## 2018-07-06 DIAGNOSIS — G20 Parkinson's disease: Secondary | ICD-10-CM | POA: Diagnosis not present

## 2018-07-06 DIAGNOSIS — R202 Paresthesia of skin: Secondary | ICD-10-CM | POA: Diagnosis not present

## 2018-07-06 DIAGNOSIS — R200 Anesthesia of skin: Secondary | ICD-10-CM | POA: Diagnosis not present

## 2018-07-12 DIAGNOSIS — G20 Parkinson's disease: Secondary | ICD-10-CM | POA: Diagnosis not present

## 2018-07-12 DIAGNOSIS — M79671 Pain in right foot: Secondary | ICD-10-CM | POA: Diagnosis not present

## 2018-07-12 DIAGNOSIS — Z9181 History of falling: Secondary | ICD-10-CM | POA: Diagnosis not present

## 2018-07-12 DIAGNOSIS — G8911 Acute pain due to trauma: Secondary | ICD-10-CM | POA: Diagnosis not present

## 2018-07-14 DIAGNOSIS — G20 Parkinson's disease: Secondary | ICD-10-CM | POA: Diagnosis not present

## 2018-07-14 DIAGNOSIS — M79671 Pain in right foot: Secondary | ICD-10-CM | POA: Diagnosis not present

## 2018-07-14 DIAGNOSIS — Z9181 History of falling: Secondary | ICD-10-CM | POA: Diagnosis not present

## 2018-07-14 DIAGNOSIS — G8911 Acute pain due to trauma: Secondary | ICD-10-CM | POA: Diagnosis not present

## 2018-07-17 DIAGNOSIS — M79671 Pain in right foot: Secondary | ICD-10-CM | POA: Diagnosis not present

## 2018-07-17 DIAGNOSIS — G8911 Acute pain due to trauma: Secondary | ICD-10-CM | POA: Diagnosis not present

## 2018-07-17 DIAGNOSIS — G20 Parkinson's disease: Secondary | ICD-10-CM | POA: Diagnosis not present

## 2018-07-17 DIAGNOSIS — Z9181 History of falling: Secondary | ICD-10-CM | POA: Diagnosis not present

## 2018-07-19 DIAGNOSIS — G20 Parkinson's disease: Secondary | ICD-10-CM | POA: Diagnosis not present

## 2018-07-19 DIAGNOSIS — M79671 Pain in right foot: Secondary | ICD-10-CM | POA: Diagnosis not present

## 2018-07-19 DIAGNOSIS — G8911 Acute pain due to trauma: Secondary | ICD-10-CM | POA: Diagnosis not present

## 2018-07-19 DIAGNOSIS — Z9181 History of falling: Secondary | ICD-10-CM | POA: Diagnosis not present

## 2018-07-24 ENCOUNTER — Other Ambulatory Visit: Payer: Self-pay | Admitting: Family Medicine

## 2018-08-03 ENCOUNTER — Other Ambulatory Visit: Payer: Self-pay

## 2018-08-03 ENCOUNTER — Ambulatory Visit (INDEPENDENT_AMBULATORY_CARE_PROVIDER_SITE_OTHER): Payer: Medicare Other | Admitting: Family Medicine

## 2018-08-03 ENCOUNTER — Encounter: Payer: Self-pay | Admitting: Family Medicine

## 2018-08-03 VITALS — BP 132/80 | HR 84 | Temp 99.2°F | Ht 67.0 in | Wt 156.3 lb

## 2018-08-03 DIAGNOSIS — I10 Essential (primary) hypertension: Secondary | ICD-10-CM

## 2018-08-03 DIAGNOSIS — E782 Mixed hyperlipidemia: Secondary | ICD-10-CM | POA: Diagnosis not present

## 2018-08-03 DIAGNOSIS — Z23 Encounter for immunization: Secondary | ICD-10-CM | POA: Diagnosis not present

## 2018-08-03 LAB — MICROALBUMIN, URINE WAIVED
Creatinine, Urine Waived: 10 mg/dL (ref 10–300)
Microalb, Ur Waived: 10 mg/L (ref 0–19)
Microalb/Creat Ratio: <30 mg/g (ref <30)

## 2018-08-03 MED ORDER — LOVASTATIN 40 MG PO TABS: 40.0000 mg | ORAL_TABLET | Freq: Every day | ORAL | 1 refills | Status: DC

## 2018-08-03 MED ORDER — LOSARTAN POTASSIUM 25 MG PO TABS: 25.0000 mg | ORAL_TABLET | Freq: Every day | ORAL | 1 refills | Status: DC

## 2018-08-03 MED ORDER — HYDROCHLOROTHIAZIDE 25 MG PO TABS: ORAL_TABLET | ORAL | 1 refills | Status: DC

## 2018-08-03 NOTE — Assessment & Plan Note (Signed)
Under good control on current regimen. Continue current regimen. Continue to monitor. Call with any concerns. Refills given.   

## 2018-08-03 NOTE — Progress Notes (Signed)
BP 132/80   Pulse 84   Temp 99.2 F (37.3 C) (Tympanic)   Ht 5\' 7"  (1.702 m)   Wt 156 lb 4.8 oz (70.9 kg)   SpO2 98%   BMI 24.48 kg/m    Subjective:    Patient ID: Melissa Wise, female    DOB: 09/03/1951, 67 y.o.   MRN: [id number]  HPI: Melissa Wise is a 67 y.o. female  Chief Complaint  Patient presents with  . Hypertension  . Hyperlipidemia   HYPERTENSION / HYPERLIPIDEMIA Satisfied with current treatment? yes Duration of hypertension: chronic BP monitoring frequency: not checking BP medication side effects: no Past BP meds: losartan, hctz Duration of hyperlipidemia: chronic Cholesterol medication side effects: no Cholesterol supplements: fish oil Past cholesterol medications: lovastatin Medication compliance: excellent compliance Aspirin: no Recent stressors: no Recurrent headaches: no Visual changes: no Palpitations: no Dyspnea: no Chest pain: no Lower extremity edema: no Dizzy/lightheaded: no  Relevant past medical, surgical, family and social history reviewed and updated as indicated. Interim medical history since our last visit reviewed. Allergies and medications reviewed and updated.  Review of Systems  Constitutional: Negative.   HENT: Positive for congestion. Negative for dental problem, drooling, ear discharge, ear pain, facial swelling, hearing loss, mouth sores, nosebleeds, postnasal drip, rhinorrhea, sinus pressure, sinus pain, sneezing, sore throat, tinnitus, trouble swallowing and voice change.   Respiratory: Positive for cough. Negative for apnea, choking, chest tightness, shortness of breath, wheezing and stridor.   Cardiovascular: Negative.   Neurological: Negative.   Psychiatric/Behavioral: Negative.     Per HPI unless specifically indicated above     Objective:    BP 132/80   Pulse 84   Temp 99.2 F (37.3 C) (Tympanic)   Ht 5\' 7"  (1.702 m)   Wt 156 lb 4.8 oz (70.9 kg)   SpO2 98%   BMI 24.48 kg/m   Wt Readings from Last 3  Encounters:  08/03/18 156 lb 4.8 oz (70.9 kg)  05/04/18 153 lb 14.4 oz (69.8 kg)  02/27/18 157 lb (71.2 kg)    Physical Exam  Constitutional: She is oriented to person, place, and time. She appears well-developed and well-nourished. No distress.  HENT:  Head: Normocephalic and atraumatic.  Right Ear: Hearing normal.  Left Ear: Hearing normal.  Nose: Nose normal.  Eyes: Conjunctivae and lids are normal. Right eye exhibits no discharge. Left eye exhibits no discharge. No scleral icterus.  Cardiovascular: Normal rate, regular rhythm, normal heart sounds and intact distal pulses. Exam reveals no gallop and no friction rub.  No murmur heard. Pulmonary/Chest: Effort normal and breath sounds normal. No stridor. No respiratory distress. She has no wheezes. She has no rales. She exhibits no tenderness.  Musculoskeletal: Normal range of motion.  Neurological: She is alert and oriented to person, place, and time.  Skin: Skin is warm, dry and intact. Capillary refill takes less than 2 seconds. No rash noted. She is not diaphoretic. No erythema. No pallor.  Psychiatric: She has a normal mood and affect. Her speech is normal and behavior is normal. Judgment and thought content normal. Cognition and memory are normal.  Nursing note and vitals reviewed.   Results for orders placed or performed in visit on 01/75/10  Basic metabolic panel  Result Value Ref Range   Glucose 79 65 - 99 mg/dL   BUN 11 8 - 27 mg/dL   Creatinine, Ser 0.64 0.57 - 1.00 mg/dL   GFR calc non Af Amer 93 >59 mL/min/1.73   GFR calc Af  Amer 108 >59 mL/min/1.73   BUN/Creatinine Ratio 17 12 - 28   Sodium 134 134 - 144 mmol/L   Potassium 3.9 3.5 - 5.2 mmol/L   Chloride 89 (L) 96 - 106 mmol/L   CO2 26 20 - 29 mmol/L   Calcium 9.4 8.7 - 10.3 mg/dL      Assessment & Plan:   Problem List Items Addressed This Visit      Cardiovascular and Mediastinum   Hypertension - Primary    Under good control on current regimen. Continue  current regimen. Continue to monitor. Call with any concerns. Refills given.        Relevant Medications   hydrochlorothiazide (HYDRODIURIL) 25 MG tablet   losartan (COZAAR) 25 MG tablet   lovastatin (MEVACOR) 40 MG tablet   Other Relevant Orders   CBC with Differential/Platelet   Comprehensive metabolic panel   Microalbumin, Urine Waived     Other   Hyperlipidemia    Under good control on current regimen. Continue current regimen. Continue to monitor. Call with any concerns. Refills given.        Relevant Medications   hydrochlorothiazide (HYDRODIURIL) 25 MG tablet   losartan (COZAAR) 25 MG tablet   lovastatin (MEVACOR) 40 MG tablet   Other Relevant Orders   CBC with Differential/Platelet   Comprehensive metabolic panel   Lipid Panel w/o Chol/HDL Ratio    Other Visit Diagnoses    Flu vaccine need       Flu shot given today.   Relevant Orders   Flu vaccine HIGH DOSE PF       Follow up plan: Return in about 6 months (around 02/01/2019) for Physical/medicare.

## 2018-08-04 ENCOUNTER — Encounter: Payer: Self-pay | Admitting: Family Medicine

## 2018-08-04 LAB — CBC WITH DIFFERENTIAL/PLATELET
BASOS ABS: 0 10*3/uL (ref 0.0–0.2)
Basos: 0 %
EOS (ABSOLUTE): 0 10*3/uL (ref 0.0–0.4)
Eos: 1 %
Hematocrit: 40.9 % (ref 34.0–46.6)
Hemoglobin: 13.5 g/dL (ref 11.1–15.9)
IMMATURE GRANS (ABS): 0 10*3/uL (ref 0.0–0.1)
IMMATURE GRANULOCYTES: 0 %
LYMPHS: 30 %
Lymphocytes Absolute: 2.1 10*3/uL (ref 0.7–3.1)
MCH: 28.8 pg (ref 26.6–33.0)
MCHC: 33 g/dL (ref 31.5–35.7)
MCV: 87 fL (ref 79–97)
MONOS ABS: 0.4 10*3/uL (ref 0.1–0.9)
Monocytes: 6 %
NEUTROS PCT: 63 %
Neutrophils Absolute: 4.5 10*3/uL (ref 1.4–7.0)
PLATELETS: 205 10*3/uL (ref 150–450)
RBC: 4.68 x10E6/uL (ref 3.77–5.28)
RDW: 13.1 % (ref 12.3–15.4)
WBC: 7.1 10*3/uL (ref 3.4–10.8)

## 2018-08-04 LAB — COMPREHENSIVE METABOLIC PANEL
A/G RATIO: 2.2 (ref 1.2–2.2)
ALT: 20 IU/L (ref 0–32)
AST: 18 IU/L (ref 0–40)
Albumin: 4.7 g/dL (ref 3.6–4.8)
Alkaline Phosphatase: 64 IU/L (ref 39–117)
BILIRUBIN TOTAL: 0.3 mg/dL (ref 0.0–1.2)
BUN/Creatinine Ratio: 18 (ref 12–28)
BUN: 13 mg/dL (ref 8–27)
CALCIUM: 9.4 mg/dL (ref 8.7–10.3)
CHLORIDE: 93 mmol/L — AB (ref 96–106)
CO2: 27 mmol/L (ref 20–29)
Creatinine, Ser: 0.73 mg/dL (ref 0.57–1.00)
GFR calc non Af Amer: 85 mL/min/{1.73_m2} (ref >59)
GFR, EST AFRICAN AMERICAN: 99 mL/min/{1.73_m2} (ref >59)
GLUCOSE: 88 mg/dL (ref 65–99)
Globulin, Total: 2.1 g/dL (ref 1.5–4.5)
POTASSIUM: 3.9 mmol/L (ref 3.5–5.2)
Sodium: 135 mmol/L (ref 134–144)
TOTAL PROTEIN: 6.8 g/dL (ref 6.0–8.5)

## 2018-08-04 LAB — LIPID PANEL W/O CHOL/HDL RATIO
Cholesterol, Total: 144 mg/dL (ref 100–199)
HDL: 56 mg/dL (ref >39)
LDL Calculated: 55 mg/dL (ref 0–99)
TRIGLYCERIDES: 163 mg/dL — AB (ref 0–149)
VLDL CHOLESTEROL CAL: 33 mg/dL (ref 5–40)

## 2018-10-12 DIAGNOSIS — G20 Parkinson's disease: Secondary | ICD-10-CM | POA: Diagnosis not present

## 2018-12-07 DIAGNOSIS — G20 Parkinson's disease: Secondary | ICD-10-CM | POA: Diagnosis not present

## 2018-12-07 DIAGNOSIS — R200 Anesthesia of skin: Secondary | ICD-10-CM | POA: Diagnosis not present

## 2018-12-11 NOTE — Telephone Encounter (Signed)
closing encounter

## 2018-12-19 ENCOUNTER — Encounter: Payer: Self-pay | Admitting: Family Medicine

## 2018-12-19 ENCOUNTER — Other Ambulatory Visit: Payer: Self-pay

## 2018-12-19 ENCOUNTER — Ambulatory Visit (INDEPENDENT_AMBULATORY_CARE_PROVIDER_SITE_OTHER): Payer: Medicare Other | Admitting: Family Medicine

## 2018-12-19 VITALS — BP 122/76 | HR 86 | Temp 98.8°F | Ht 67.0 in | Wt 151.0 lb

## 2018-12-19 DIAGNOSIS — G20 Parkinson's disease: Secondary | ICD-10-CM | POA: Diagnosis not present

## 2018-12-19 DIAGNOSIS — F339 Major depressive disorder, recurrent, unspecified: Secondary | ICD-10-CM | POA: Diagnosis not present

## 2018-12-19 MED ORDER — HYDROXYZINE HCL 10 MG PO TABS: 10.0000 mg | ORAL_TABLET | Freq: Three times a day (TID) | ORAL | 6 refills | Status: DC | PRN

## 2018-12-19 NOTE — Assessment & Plan Note (Signed)
Not doing great. Doesn't want to take daily medicine at this time. Will start PRN hydroxyzine and refer to psychology for CBT. Call with any concerns. Continue to monitor. Recheck 1-3 months.

## 2018-12-19 NOTE — Assessment & Plan Note (Signed)
Continues to work with Dr. Melrose Nakayama. Call with any concerns. Continue to monitor.

## 2018-12-19 NOTE — Progress Notes (Signed)
BP 122/76   Pulse 86   Temp 98.8 F (37.1 C) (Oral)   Ht 5\' 7"  (1.702 m)   Wt 151 lb (68.5 kg)   SpO2 100%   BMI 23.65 kg/m    Subjective:    Patient ID: Melissa Wise, female    DOB: 10-29-1951, 68 y.o.   MRN: [id number]  HPI: Melissa Wise is a 68 y.o. female  Chief Complaint  Patient presents with  . Depression    pt's sister states that pt has had issues walking since middle of November  . Anxiety   DEPRESSION- Known Parkinson's. Has not been doing well. Increased problems with balance. Has been following with her neurologist, but having some issues getting better and finding a regimen that works for her. This has been frustrating for her. He discussed seeing a counselor with her at her last appointment. She notes that this has been going on for a while. Moved into an apartment about 3 months ago, and this caused her to become very anxious. Seems to be getting worse, she is very concerned about not able to move and making her more nervous. Mood status: exacerbated Satisfied with current treatment?: no Symptom severity: moderate  Duration of current treatment : Not on anything, has been having issues for about 3 months Psychotherapy/counseling: no  Previous psychiatric medications: none Depressed mood: yes Anxious mood: yes Anhedonia: no Significant weight loss or gain: no Insomnia: no  Fatigue: yes Feelings of worthlessness or guilt: yes Impaired concentration/indecisiveness: yes Suicidal ideations: no Hopelessness: no Crying spells: no Depression screen [facility] 2/9 12/19/2018 01/27/2018 10/27/2017 10/21/2016 05/04/2016  Decreased Interest 3 1 0 0 0  Down, Depressed, Hopeless 3 0 0 1 0  PHQ - 2 Score 6 1 0 1 0  Altered sleeping 3 0 - - -  Tired, decreased energy 3 1 - - -  Change in appetite 0 0 - - -  Feeling bad or failure about yourself  1 1 - - -  Trouble concentrating 1 0 - - -  Moving slowly or fidgety/restless 0 0 - - -  Suicidal thoughts 0 0 - - -  PHQ-9  Score 14 3 - - -   GAD 7 : Generalized Anxiety Score 12/19/2018  Nervous, Anxious, on Edge 3  Control/stop worrying 3  Worry too much - different things 3  Trouble relaxing 2  Restless 0  Easily annoyed or irritable 2  Afraid - awful might happen 3  Total GAD 7 Score 16  Anxiety Difficulty Very difficult    Relevant past medical, surgical, family and social history reviewed and updated as indicated. Interim medical history since our last visit reviewed. Allergies and medications reviewed and updated.  Review of Systems  Constitutional: Negative.   Respiratory: Negative.   Cardiovascular: Negative.   Musculoskeletal: Negative.   Skin: Negative.   Neurological: Positive for weakness and headaches. Negative for dizziness, tremors, seizures, syncope, facial asymmetry, speech difficulty, light-headedness and numbness.  Hematological: Negative.   Psychiatric/Behavioral: Positive for dysphoric mood. Negative for agitation, behavioral problems, confusion, decreased concentration, hallucinations, self-injury, sleep disturbance and suicidal ideas. The patient is nervous/anxious. The patient is not hyperactive.     Per HPI unless specifically indicated above     Objective:    BP 122/76   Pulse 86   Temp 98.8 F (37.1 C) (Oral)   Ht 5\' 7"  (1.702 m)   Wt 151 lb (68.5 kg)   SpO2 100%   BMI 23.65 kg/m  Wt Readings from Last 3 Encounters:  12/19/18 151 lb (68.5 kg)  08/03/18 156 lb 4.8 oz (70.9 kg)  05/04/18 153 lb 14.4 oz (69.8 kg)    Physical Exam Vitals signs and nursing note reviewed.  Constitutional:      General: She is not in acute distress.    Appearance: Normal appearance. She is not ill-appearing, toxic-appearing or diaphoretic.  HENT:     Head: Normocephalic and atraumatic.     Right Ear: External ear normal.     Left Ear: External ear normal.     Nose: Nose normal.     Mouth/Throat:     Mouth: Mucous membranes are moist.     Pharynx: Oropharynx is clear.  Eyes:      General: No scleral icterus.       Right eye: No discharge.        Left eye: No discharge.     Extraocular Movements: Extraocular movements intact.     Conjunctiva/sclera: Conjunctivae normal.     Pupils: Pupils are equal, round, and reactive to light.  Neck:     Musculoskeletal: Normal range of motion and neck supple.  Cardiovascular:     Rate and Rhythm: Normal rate and regular rhythm.     Pulses: Normal pulses.     Heart sounds: Normal heart sounds. No murmur. No friction rub. No gallop.   Pulmonary:     Effort: Pulmonary effort is normal. No respiratory distress.     Breath sounds: Normal breath sounds. No stridor. No wheezing, rhonchi or rales.  Chest:     Chest wall: No tenderness.  Musculoskeletal: Normal range of motion.  Skin:    General: Skin is warm and dry.     Capillary Refill: Capillary refill takes less than 2 seconds.     Coloration: Skin is not jaundiced or pale.     Findings: No bruising, erythema, lesion or rash.  Neurological:     General: No focal deficit present.     Mental Status: She is alert and oriented to person, place, and time. Mental status is at baseline.  Psychiatric:        Mood and Affect: Mood normal.        Behavior: Behavior normal.        Thought Content: Thought content normal.        Judgment: Judgment normal.     Results for orders placed or performed in visit on 08/03/18  CBC with Differential/Platelet  Result Value Ref Range   WBC 7.1 3.4 - 10.8 x10E3/uL   RBC 4.68 3.77 - 5.28 x10E6/uL   Hemoglobin 13.5 11.1 - 15.9 g/dL   Hematocrit 40.9 34.0 - 46.6 %   MCV 87 79 - 97 fL   MCH 28.8 26.6 - 33.0 pg   MCHC 33.0 31.5 - 35.7 g/dL   RDW 13.1 12.3 - 15.4 %   Platelets 205 150 - 450 x10E3/uL   Neutrophils 63 Not Estab. %   Lymphs 30 Not Estab. %   Monocytes 6 Not Estab. %   Eos 1 Not Estab. %   Basos 0 Not Estab. %   Neutrophils Absolute 4.5 1.4 - 7.0 x10E3/uL   Lymphocytes Absolute 2.1 0.7 - 3.1 x10E3/uL   Monocytes  Absolute 0.4 0.1 - 0.9 x10E3/uL   EOS (ABSOLUTE) 0.0 0.0 - 0.4 x10E3/uL   Basophils Absolute 0.0 0.0 - 0.2 x10E3/uL   Immature Granulocytes 0 Not Estab. %   Immature Grans (Abs) 0.0 0.0 - 0.1 x10E3/uL  Comprehensive metabolic panel  Result Value Ref Range   Glucose 88 65 - 99 mg/dL   BUN 13 8 - 27 mg/dL   Creatinine, Ser 0.73 0.57 - 1.00 mg/dL   GFR calc non Af Amer 85 >59 mL/min/1.73   GFR calc Af Amer 99 >59 mL/min/1.73   BUN/Creatinine Ratio 18 12 - 28   Sodium 135 134 - 144 mmol/L   Potassium 3.9 3.5 - 5.2 mmol/L   Chloride 93 (L) 96 - 106 mmol/L   CO2 27 20 - 29 mmol/L   Calcium 9.4 8.7 - 10.3 mg/dL   Total Protein 6.8 6.0 - 8.5 g/dL   Albumin 4.7 3.6 - 4.8 g/dL   Globulin, Total 2.1 1.5 - 4.5 g/dL   Albumin/Globulin Ratio 2.2 1.2 - 2.2   Bilirubin Total 0.3 0.0 - 1.2 mg/dL   Alkaline Phosphatase 64 39 - 117 IU/L   AST 18 0 - 40 IU/L   ALT 20 0 - 32 IU/L  Lipid Panel w/o Chol/HDL Ratio  Result Value Ref Range   Cholesterol, Total 144 100 - 199 mg/dL   Triglycerides 163 (H) 0 - 149 mg/dL   HDL 56 >39 mg/dL   VLDL Cholesterol Cal 33 5 - 40 mg/dL   LDL Calculated 55 0 - 99 mg/dL  Microalbumin, Urine Waived  Result Value Ref Range   Microalb, Ur Waived 10 0 - 19 mg/L   Creatinine, Urine Waived 10 10 - 300 mg/dL   Microalb/Creat Ratio <30 <30 mg/g      Assessment & Plan:   Problem List Items Addressed This Visit      Nervous and Auditory   Parkinson disease ([place])    Continues to work with Dr. Melrose Nakayama. Call with any concerns. Continue to monitor.         Other   Depression, recurrent ([place]) - Primary    Not doing great. Doesn't want to take daily medicine at this time. Will start PRN hydroxyzine and refer to psychology for CBT. Call with any concerns. Continue to monitor. Recheck 1-3 months.       Relevant Medications   hydrOXYzine (ATARAX/VISTARIL) 10 MG tablet       Follow up plan: Return 1-3 months, for follow up mood.

## 2019-01-01 ENCOUNTER — Telehealth: Payer: Self-pay | Admitting: Family Medicine

## 2019-01-01 DIAGNOSIS — G20 Parkinson's disease: Secondary | ICD-10-CM

## 2019-01-01 NOTE — Telephone Encounter (Signed)
Copied from [place] [id number]. Topic: Quick Communication - See Telephone Encounter >> Jan 01, 2019  3:44 PM Blase Mess A wrote: CRM for notification. See Telephone encounter for: 01/01/19.  Patient is calling to see if she can get a referral from [facility] for PT. Please advise [phone]

## 2019-01-04 DIAGNOSIS — R200 Anesthesia of skin: Secondary | ICD-10-CM | POA: Diagnosis not present

## 2019-01-04 DIAGNOSIS — G20 Parkinson's disease: Secondary | ICD-10-CM | POA: Diagnosis not present

## 2019-01-04 NOTE — Telephone Encounter (Signed)
Order placed

## 2019-01-08 ENCOUNTER — Telehealth: Payer: Self-pay | Admitting: Family Medicine

## 2019-01-08 DIAGNOSIS — Z9181 History of falling: Secondary | ICD-10-CM | POA: Diagnosis not present

## 2019-01-08 DIAGNOSIS — Z6823 Body mass index (BMI) 23.0-23.9, adult: Secondary | ICD-10-CM | POA: Diagnosis not present

## 2019-01-08 DIAGNOSIS — G20 Parkinson's disease: Secondary | ICD-10-CM | POA: Diagnosis not present

## 2019-01-08 NOTE — Telephone Encounter (Signed)
RX filled out and placed in providers folder for signature.  

## 2019-01-08 NOTE — Telephone Encounter (Signed)
OK to give verbal orders 

## 2019-01-08 NOTE — Telephone Encounter (Signed)
I'd be happy to sign for that- if you wouldn't mind writing it on a Rx pad, Dx. Parkinson's, I'll sign it and we can fax.

## 2019-01-08 NOTE — Telephone Encounter (Signed)
Message relayed to patient. Verbalized understanding and denied questions.    Melissa Wise also suggested that patient could benefit from a U-step [place] Rile that is frequently used for Parkinson's patient. Unsure if this needs to come from [place] or Neurology. Please advise.    Send to [place]

## 2019-01-08 NOTE — Telephone Encounter (Signed)
Copied from [place] [id number]. Topic: Quick Communication - Home Health Verbal Orders >> Jan 08, 2019 12:08 PM Sheran Luz wrote: Caller/Agency:[facility] [place] Number: [phone] Requesting PT  Frequency: 2xw for 6w

## 2019-01-09 ENCOUNTER — Telehealth: Payer: Self-pay | Admitting: Family Medicine

## 2019-01-09 NOTE — Telephone Encounter (Signed)
Noted. Thanks.

## 2019-01-09 NOTE — Telephone Encounter (Signed)
Copied from [place] [id number]. Topic: Quick Communication - Home Health Verbal Orders >> Jan 09, 2019 10:35 AM Oneta Rack wrote: Caller/Agency: Kathlee Nations / PT from [facility] Number: [phone]  PT wanted to inform PCP patient had a fall last night, patient is sore.

## 2019-01-10 DIAGNOSIS — Z6823 Body mass index (BMI) 23.0-23.9, adult: Secondary | ICD-10-CM | POA: Diagnosis not present

## 2019-01-10 DIAGNOSIS — G20 Parkinson's disease: Secondary | ICD-10-CM | POA: Diagnosis not present

## 2019-01-10 DIAGNOSIS — Z9181 History of falling: Secondary | ICD-10-CM | POA: Diagnosis not present

## 2019-01-15 ENCOUNTER — Telehealth: Payer: Self-pay | Admitting: Family Medicine

## 2019-01-15 DIAGNOSIS — Z9181 History of falling: Secondary | ICD-10-CM | POA: Diagnosis not present

## 2019-01-15 DIAGNOSIS — Z6823 Body mass index (BMI) 23.0-23.9, adult: Secondary | ICD-10-CM | POA: Diagnosis not present

## 2019-01-15 DIAGNOSIS — G20 Parkinson's disease: Secondary | ICD-10-CM | POA: Diagnosis not present

## 2019-01-15 NOTE — Telephone Encounter (Signed)
Written up, signed by provider and faxed to [facility]

## 2019-01-15 NOTE — Telephone Encounter (Signed)
Called (ph: [phone]) and spoke with [facility] @ [place], [place] [address]  Fax: [phone]

## 2019-01-15 NOTE — Telephone Encounter (Signed)
Do they know who does carry it so we can recommend where patient can get one?

## 2019-01-15 NOTE — Telephone Encounter (Signed)
Please write up and I'll sign

## 2019-01-15 NOTE — Telephone Encounter (Signed)
Copied from [place] [id number]. Topic: General - Other >> Jan 15, 2019 10:22 AM Yvette Rack wrote: Reason for CRM: Daria with [facility] stated they were unable to provide patient with the U step walker that was ordered because they do not carry that type of walker. Cb# [phone]

## 2019-01-16 NOTE — Telephone Encounter (Signed)
Pt states she contacted [facility] to let them know if she has to pay anything out of pocket to not release the order for a walker to her. She wanted Dr. Wynetta Emery to be aware.

## 2019-01-17 DIAGNOSIS — Z9181 History of falling: Secondary | ICD-10-CM | POA: Diagnosis not present

## 2019-01-17 DIAGNOSIS — G20 Parkinson's disease: Secondary | ICD-10-CM | POA: Diagnosis not present

## 2019-01-17 DIAGNOSIS — Z6823 Body mass index (BMI) 23.0-23.9, adult: Secondary | ICD-10-CM | POA: Diagnosis not present

## 2019-01-19 ENCOUNTER — Other Ambulatory Visit: Payer: Self-pay | Admitting: Family Medicine

## 2019-01-19 NOTE — Telephone Encounter (Signed)
Please review for refill of lovastatin 40 mg tab. Directions have changed on how to take it. Need new prescription. Dr. Park Liter

## 2019-01-21 ENCOUNTER — Other Ambulatory Visit: Payer: Self-pay

## 2019-01-21 ENCOUNTER — Emergency Department
Admission: EM | Admit: 2019-01-21 | Discharge: 2019-01-22 | Disposition: A | Discharge status: Home / Self Care | Payer: Medicare Other | Attending: Emergency Medicine | Admitting: Emergency Medicine

## 2019-01-21 ENCOUNTER — Emergency Department: Payer: Medicare Other

## 2019-01-21 DIAGNOSIS — Z79899 Other long term (current) drug therapy: Secondary | ICD-10-CM | POA: Insufficient documentation

## 2019-01-21 DIAGNOSIS — M1612 Unilateral primary osteoarthritis, left hip: Secondary | ICD-10-CM | POA: Diagnosis not present

## 2019-01-21 DIAGNOSIS — M79652 Pain in left thigh: Secondary | ICD-10-CM | POA: Diagnosis present

## 2019-01-21 DIAGNOSIS — M25552 Pain in left hip: Secondary | ICD-10-CM | POA: Diagnosis not present

## 2019-01-21 DIAGNOSIS — I10 Essential (primary) hypertension: Secondary | ICD-10-CM | POA: Insufficient documentation

## 2019-01-21 MED ORDER — ACETAMINOPHEN 325 MG PO TABS
650.0000 mg | ORAL_TABLET | Freq: Once | ORAL | Status: AC
  Administered 2019-01-21: 650 mg via ORAL
  Filled 2019-01-21: qty 2

## 2019-01-21 MED ORDER — KETOROLAC TROMETHAMINE 30 MG/ML IJ SOLN
30.0000 mg | Freq: Once | INTRAMUSCULAR | Status: AC
  Administered 2019-01-21: 30 mg via INTRAMUSCULAR
  Filled 2019-01-21: qty 1

## 2019-01-21 MED ORDER — PREDNISONE 10 MG PO TABS: 10.0000 mg | ORAL_TABLET | Freq: Every day | ORAL | 0 refills | Status: DC

## 2019-01-21 NOTE — Discharge Instructions (Addendum)
Please take Tylenol up to 1000 mg every 6 hours as needed for pain.  Please take 6-day steroid taper as prescribed.  Please ambulate with a cane or walker.  Try to avoid activities that reproduce pain.  If no improvement in 1 week follow-up with orthopedics.  Return to the ER for any increasing pain in your left thigh or groin, swelling in your left leg, fevers, warmth or redness in your left leg.

## 2019-01-21 NOTE — ED Triage Notes (Signed)
Pt states has been participating in physical therapy. Pt complains of left thigh pain that has been present for 3-4 days. Pt denies calf pain. Pt denies shob.

## 2019-01-21 NOTE — ED Provider Notes (Signed)
[facility] EMERGENCY DEPARTMENT Provider Note   CSN: [id number] Arrival date & time: 01/21/19  2025    History   Chief Complaint Chief Complaint  Patient presents with  . Leg Pain    HPI Melissa Wise is a 68 y.o. female.  Presents to the emergency department for evaluation of 3 to 4-day history of left thigh pain.  She points to her left anterior thigh described as a deep aching pain with no numbness tingling or radicular symptoms.  She denies any falls trauma or injury.  She states the pain began after performing physical therapy exercises in which she was performing hip flexion repetitively as she was sitting.  Denies the pain going down below the knee.  No swelling warmth or redness throughout the lower extremities.  No history of blood clots.  She does not smoke.  No recent surgeries long car rides or flights.  She denies any chest pain or shortness of breath.  At her baseline she ambulates with a cane.  She is currently able to ambulate with a cane.  She has no pain while she is sitting but does experience pain when she is standing from a seated position as well as sitting from a standing position.  Patient did take 325 mg of Tylenol 2 to 3 hours ago with little relief.  Patient is currently without pain as she is sitting.  At this time patient denies any pain with standing.  HPI  Past Medical History:  Diagnosis Date  . Allergy   . Ataxia   . GERD (gastroesophageal reflux disease)   . Hyperglycemia   . Hyperlipidemia   . Hypertension   . Insomnia   . Low back pain     Patient Active Problem List   Diagnosis Date Noted  . Depression, recurrent ([place]) 12/19/2018  . Peripheral edema 10/04/2016  . Parkinson disease ([place]) 09/03/2016  . Hypertension 02/04/2016  . Hyperlipidemia 02/04/2016    Past Surgical History:  Procedure Laterality Date  . ABDOMINAL HYSTERECTOMY  1991   Complete, no cancer  . CATARACT EXTRACTION Left   . SHOULDER SURGERY  Right 2015     OB History   No obstetric history on file.      Home Medications    Prior to Admission medications   Medication Sig Start Date End Date Taking? Authorizing Provider  carbidopa-levodopa (SINEMET IR) 25-100 MG tablet  10/07/16   [provider]  COCONUT OIL PO Take by mouth.    [provider]  FIBER ADULT GUMMIES PO Take by mouth.    [provider]  hydrochlorothiazide (HYDRODIURIL) 25 MG tablet TAKE 1 TABLET(25 MG) BY MOUTH DAILY 08/03/18   Johnson, Megan P, DO  hydrOXYzine (ATARAX/VISTARIL) 10 MG tablet Take 1 tablet (10 mg total) by mouth 3 (three) times daily as needed for anxiety. 12/19/18   Johnson, Megan P, DO  losartan (COZAAR) 25 MG tablet Take 1 tablet (25 mg total) by mouth daily. 08/03/18   Johnson, Megan P, DO  lovastatin (MEVACOR) 40 MG tablet TAKE 1 AND 1/2 TABLETS BY MOUTH WITH DINNER 01/19/19   Johnson, Megan P, DO  Multiple Vitamin (MULTIVITAMIN) tablet Take 1 tablet by mouth daily.    [provider]  Omega-3 Fatty Acids (FISH OIL) 1000 MG CAPS Take 300 mg by mouth.     [provider]  OVER THE COUNTER MEDICATION VitaFusion Fiber Well, sugar free gummies with digestive health, regularity support and probiotic    [provider]  predniSONE (DELTASONE) 10 MG tablet Take 1 tablet (10 mg total) by mouth daily. 6,5,4,3,2,1 six day taper 01/21/19   Duanne Guess, PA-C  TURMERIC PO Take by mouth.    [provider]    Family History Family History  Problem Relation Age of Onset  . Cancer Mother   . Hypertension Father   . Heart attack Father   . Diabetes Maternal Grandmother   . Cancer Paternal Grandmother        Stomach    Social History Social History   Tobacco Use  . Smoking status: Never Smoker  . Smokeless tobacco: Never Used  Substance Use Topics  . Alcohol use: Yes    Alcohol/week: 1.0 standard drinks    Types: 1 Glasses of wine per week    Comment: occassionally  . Drug  use: No     Allergies   Amlodipine and Enalapril   Review of Systems Review of Systems  Constitutional: Negative for fever.  Respiratory: Negative for shortness of breath.   Cardiovascular: Negative for chest pain.  Gastrointestinal: Negative for abdominal pain.  Genitourinary: Negative for difficulty urinating, dysuria and urgency.  Musculoskeletal: Positive for arthralgias and myalgias. Negative for back pain, gait problem, joint swelling and neck pain.  Skin: Negative for rash and wound.  Neurological: Negative for dizziness and headaches.     Physical Exam Updated Vital Signs BP (!) 157/81   Pulse 84   Temp 98.6 F (37 C) (Oral)   Resp 16   Ht 5\' 8"  (1.727 m)   Wt 68.9 kg   SpO2 100%   BMI 23.11 kg/m   Physical Exam Constitutional:      Appearance: She is well-developed.  HENT:     Head: Normocephalic and atraumatic.  Eyes:     Conjunctiva/sclera: Conjunctivae normal.  Neck:     Musculoskeletal: Normal range of motion.  Cardiovascular:     Rate and Rhythm: Normal rate.  Pulmonary:     Effort: Pulmonary effort is normal. No respiratory distress.  Musculoskeletal: Normal range of motion.     Comments: Examination of the left lower extremity shows patient has no swelling warmth erythema or edema.  No tenderness to palpation.  Leg sizes are symmetrical.  Thigh is soft.  Calf is soft.  Negative Homans sign.  Sensation is intact distally with 2+ dorsalis pedis pulse.  There is good skin color and warmth throughout the lower extremity.  She has extremely stiff left hip with internal rotation.  Internal rotation reproduces anterior thigh pain.  She has pain with hip flexion.  Nontender along the trochanteric bursa.  She is nontender along the lumbar spine.  Skin:    General: Skin is warm.     Findings: No rash.  Neurological:     Mental Status: She is alert and oriented to person, place, and time.  Psychiatric:        Behavior: Behavior normal.        Thought  Content: Thought content normal.      ED Treatments / Results  Labs (all labs ordered are listed, but only abnormal results are displayed) Labs Reviewed - No data to display  EKG None  Radiology Dg Hip Unilat W Or Wo Pelvis 2-3 Views Left  Result Date: 01/21/2019 CLINICAL DATA:  Left hip and thigh pain.  Ongoing physical therapy. EXAM: DG HIP (WITH OR WITHOUT PELVIS) 2-3V LEFT COMPARISON:  None. FINDINGS: Mild symmetric degenerative changes in the hips with joint space narrowing  and spurring. No acute bony abnormality. Specifically, no fracture, subluxation, or dislocation. IMPRESSION: No acute bony abnormality. Electronically Signed   By: Rolm Baptise M.D.   On: 01/21/2019 22:58    Procedures Procedures (including critical care time)  Medications Ordered in ED Medications  acetaminophen (TYLENOL) tablet 650 mg (has no administration in time range)  ketorolac (TORADOL) 30 MG/ML injection 30 mg (has no administration in time range)     Initial Impression / Assessment and Plan / ED Course  I have reviewed the triage vital signs and the nursing notes.  Pertinent labs & imaging results that were available during my care of the patient were reviewed by me and considered in my medical decision making (see chart for details).        68 year old female with acute left anterior thigh pain.  History, physical exam and x-rays consistent with left hip osteoarthritis.  Patient with acute flareup after physical therapy exercises 3 to 4 days ago.  There have been no changes to her gait.  Pain is mostly with sitting and standing and other activities that reproduce hip flexion such as putting on her shoes or going up steps.  She is given Toradol injection 30 mg IM today.  She is also given 650 mg of Tylenol.  She will start a 6-day steroid taper tomorrow.  She will follow-up with orthopedics if no improvement.  She understands signs and symptoms to return to the ED for.  Patient with no concerns  for DVT as she has no risk factors and no swelling warmth redness or tenderness to palpation throughout the left lower extremity.  Final Clinical Impressions(s) / ED Diagnoses   Final diagnoses:  Left hip pain  Primary osteoarthritis of left hip    ED Discharge Orders         Ordered    predniSONE (DELTASONE) 10 MG tablet  Daily     01/21/19 2342           Renata Caprice 01/21/19 2351    Lavonia Drafts, MD 01/24/19 [phone]

## 2019-01-22 NOTE — ED Notes (Signed)
No peripheral IV placed this visit.    Discharge instructions reviewed with patient. Questions fielded by this RN. Patient verbalizes understanding of instructions. Patient discharged home in stable condition per Chris, PA. No acute distress noted at time of discharge.    

## 2019-01-23 DIAGNOSIS — H2511 Age-related nuclear cataract, right eye: Secondary | ICD-10-CM | POA: Diagnosis not present

## 2019-01-24 ENCOUNTER — Telehealth: Payer: Self-pay | Admitting: Family Medicine

## 2019-01-24 DIAGNOSIS — Z6823 Body mass index (BMI) 23.0-23.9, adult: Secondary | ICD-10-CM | POA: Diagnosis not present

## 2019-01-24 DIAGNOSIS — G20 Parkinson's disease: Secondary | ICD-10-CM | POA: Diagnosis not present

## 2019-01-24 DIAGNOSIS — Z9181 History of falling: Secondary | ICD-10-CM | POA: Diagnosis not present

## 2019-01-24 NOTE — Telephone Encounter (Signed)
I am aware  

## 2019-01-24 NOTE — Telephone Encounter (Signed)
Copied from [place] [id number]. Topic: General - Other >> Jan 24, 2019 10:44 AM Antonieta Iba C wrote: Reason for CRM: Kathlee Nations with [facility] home [facility] is calling in to make provider aware that over the weekend pt was seen at the ED for left hip and leg pain.    No call back to Kathlee Nations is needed.    Phone: just in case - [phone]

## 2019-01-25 ENCOUNTER — Other Ambulatory Visit: Payer: Self-pay | Admitting: Family Medicine

## 2019-01-25 DIAGNOSIS — M1612 Unilateral primary osteoarthritis, left hip: Secondary | ICD-10-CM | POA: Diagnosis not present

## 2019-01-26 DIAGNOSIS — Z6823 Body mass index (BMI) 23.0-23.9, adult: Secondary | ICD-10-CM | POA: Diagnosis not present

## 2019-01-26 DIAGNOSIS — Z9181 History of falling: Secondary | ICD-10-CM | POA: Diagnosis not present

## 2019-01-26 DIAGNOSIS — G20 Parkinson's disease: Secondary | ICD-10-CM | POA: Diagnosis not present

## 2019-01-29 DIAGNOSIS — Z6823 Body mass index (BMI) 23.0-23.9, adult: Secondary | ICD-10-CM | POA: Diagnosis not present

## 2019-01-29 DIAGNOSIS — G20 Parkinson's disease: Secondary | ICD-10-CM | POA: Diagnosis not present

## 2019-01-29 DIAGNOSIS — Z9181 History of falling: Secondary | ICD-10-CM | POA: Diagnosis not present

## 2019-01-31 DIAGNOSIS — G20 Parkinson's disease: Secondary | ICD-10-CM | POA: Diagnosis not present

## 2019-01-31 DIAGNOSIS — Z6823 Body mass index (BMI) 23.0-23.9, adult: Secondary | ICD-10-CM | POA: Diagnosis not present

## 2019-01-31 DIAGNOSIS — Z9181 History of falling: Secondary | ICD-10-CM | POA: Diagnosis not present

## 2019-02-01 DIAGNOSIS — M25552 Pain in left hip: Secondary | ICD-10-CM | POA: Diagnosis not present

## 2019-02-01 DIAGNOSIS — M1612 Unilateral primary osteoarthritis, left hip: Secondary | ICD-10-CM | POA: Diagnosis not present

## 2019-02-05 DIAGNOSIS — Z6823 Body mass index (BMI) 23.0-23.9, adult: Secondary | ICD-10-CM | POA: Diagnosis not present

## 2019-02-05 DIAGNOSIS — Z9181 History of falling: Secondary | ICD-10-CM | POA: Diagnosis not present

## 2019-02-05 DIAGNOSIS — G20 Parkinson's disease: Secondary | ICD-10-CM | POA: Diagnosis not present

## 2019-02-05 NOTE — Progress Notes (Signed)
BP 106/68 (BP Location: Left Arm, Patient Position: Sitting, Cuff Size: Normal)   Pulse 85   Ht 5\' 7"  (1.702 m)   Wt 155 lb (70.3 kg)   BMI 24.28 kg/m    Subjective:    Patient ID: Melissa Wise, female    DOB: Oct 31, 1951, 68 y.o.   MRN: [id number]  HPI: Melissa Wise is a 68 y.o. female  Chief Complaint  Patient presents with  . Depression    Follow-up  . Hyperlipidemia  . Hypertension  . Dysuria    Patient states off and on burning with urination. Ongoing 2-3 months patient states.   HYPERTENSION / HYPERLIPIDEMIA Satisfied with current treatment? yes Duration of hypertension: chronic BP monitoring frequency: not checking BP range:  BP medication side effects: no Past BP meds: losartan Duration of hyperlipidemia: chronic Cholesterol medication side effects: no Cholesterol supplements: none Past cholesterol medications: lovastatin Medication compliance: excellent compliance Aspirin: no Recent stressors: yes Recurrent headaches: no Visual changes: no Palpitations: no Dyspnea: no Chest pain: no Lower extremity edema: no Dizzy/lightheaded: no  URINARY SYMPTOMS Duration: 2-3 months Dysuria: yes Urinary frequency: yes Urgency: yes Small volume voids: no Symptom severity: moderate Urinary incontinence: yes Foul odor: no Hematuria: no Abdominal pain: no Back pain: no Suprapubic pain/pressure: no Flank pain: no Fever:  no Vomiting: no Relief with cranberry juice: yes Relief with pyridium: no Status: stable Vaginal discharge: no Treatments attempted: cranberry  DEPRESSION- has not gotten into see a counselor. Has been having trouble finding one that takes her insurance. Frustrated with the current situation that she can't get out  Mood status: stable Satisfied with current treatment?: no Symptom severity: moderate  Duration of current treatment : not on anything Psychotherapy/counseling: no  Depressed mood: yes Anxious mood: yes Anhedonia: no  Significant weight loss or gain: no Insomnia: yes  Fatigue: yes Feelings of worthlessness or guilt: no Impaired concentration/indecisiveness: yes Suicidal ideations: no Hopelessness: no Crying spells: yes Depression screen [facility] 2/9 02/08/2019 12/19/2018 01/27/2018 10/27/2017 10/21/2016  Decreased Interest 2 3 1  0 0  Down, Depressed, Hopeless 2 3 0 0 1  PHQ - 2 Score 4 6 1  0 1  Altered sleeping 2 3 0 - -  Tired, decreased energy 0 3 1 - -  Change in appetite 0 0 0 - -  Feeling bad or failure about yourself  0 1 1 - -  Trouble concentrating 1 1 0 - -  Moving slowly or fidgety/restless 1 0 0 - -  Suicidal thoughts 0 0 0 - -  PHQ-9 Score 8 14 3  - -  Difficult doing work/chores Not difficult at all - - - -   GAD 7 : Generalized Anxiety Score 02/08/2019 12/19/2018  Nervous, Anxious, on Edge 0 3  Control/stop worrying 1 3  Worry too much - different things 1 3  Trouble relaxing 1 2  Restless 1 0  Easily annoyed or irritable 1 2  Afraid - awful might happen 0 3  Total GAD 7 Score 5 16  Anxiety Difficulty Not difficult at all Very difficult      Relevant past medical, surgical, family and social history reviewed and updated as indicated. Interim medical history since our last visit reviewed. Allergies and medications reviewed and updated.  Review of Systems  Constitutional: Negative.   Respiratory: Negative.   Cardiovascular: Negative.   Musculoskeletal: Positive for arthralgias. Negative for back pain, gait problem, joint swelling, myalgias, neck pain and neck stiffness.  Skin: Negative.   Neurological: Positive  for weakness. Negative for dizziness, tremors, seizures, syncope, facial asymmetry, speech difficulty, light-headedness, numbness and headaches.  Psychiatric/Behavioral: Positive for dysphoric mood. Negative for agitation, behavioral problems, confusion, decreased concentration, hallucinations, self-injury, sleep disturbance and suicidal ideas. The patient is nervous/anxious.  The patient is not hyperactive.     Per HPI unless specifically indicated above     Objective:    BP 106/68 (BP Location: Left Arm, Patient Position: Sitting, Cuff Size: Normal)   Pulse 85   Ht 5\' 7"  (1.702 m)   Wt 155 lb (70.3 kg)   BMI 24.28 kg/m   Wt Readings from Last 3 Encounters:  02/08/19 155 lb (70.3 kg)  01/21/19 152 lb (68.9 kg)  12/19/18 151 lb (68.5 kg)    Physical Exam Vitals signs and nursing note reviewed.  Constitutional:      General: She is not in acute distress.    Appearance: Normal appearance. She is normal weight. She is not ill-appearing, toxic-appearing or diaphoretic.  HENT:     Head: Normocephalic and atraumatic.     Right Ear: External ear normal.     Left Ear: External ear normal.     Nose: Nose normal.     Mouth/Throat:     Mouth: Mucous membranes are moist.     Pharynx: Oropharynx is clear.  Eyes:     General: No scleral icterus.       Right eye: No discharge.        Left eye: No discharge.     Conjunctiva/sclera: Conjunctivae normal.     Pupils: Pupils are equal, round, and reactive to light.  Neck:     Musculoskeletal: Normal range of motion.  Pulmonary:     Effort: Pulmonary effort is normal. No respiratory distress.     Comments: Speaking in full sentences Musculoskeletal: Normal range of motion.  Skin:    Coloration: Skin is not jaundiced or pale.     Findings: No bruising, erythema, lesion or rash.  Neurological:     Mental Status: She is alert and oriented to person, place, and time. Mental status is at baseline.  Psychiatric:        Mood and Affect: Mood normal.        Behavior: Behavior normal.        Thought Content: Thought content normal.        Judgment: Judgment normal.     Results for orders placed or performed in visit on 08/03/18  CBC with Differential/Platelet  Result Value Ref Range   WBC 7.1 3.4 - 10.8 x10E3/uL   RBC 4.68 3.77 - 5.28 x10E6/uL   Hemoglobin 13.5 11.1 - 15.9 g/dL   Hematocrit 40.9 34.0 -  46.6 %   MCV 87 79 - 97 fL   MCH 28.8 26.6 - 33.0 pg   MCHC 33.0 31.5 - 35.7 g/dL   RDW 13.1 12.3 - 15.4 %   Platelets 205 150 - 450 x10E3/uL   Neutrophils 63 Not Estab. %   Lymphs 30 Not Estab. %   Monocytes 6 Not Estab. %   Eos 1 Not Estab. %   Basos 0 Not Estab. %   Neutrophils Absolute 4.5 1.4 - 7.0 x10E3/uL   Lymphocytes Absolute 2.1 0.7 - 3.1 x10E3/uL   Monocytes Absolute 0.4 0.1 - 0.9 x10E3/uL   EOS (ABSOLUTE) 0.0 0.0 - 0.4 x10E3/uL   Basophils Absolute 0.0 0.0 - 0.2 x10E3/uL   Immature Granulocytes 0 Not Estab. %   Immature Grans (Abs) 0.0 0.0 - 0.1  x10E3/uL  Comprehensive metabolic panel  Result Value Ref Range   Glucose 88 65 - 99 mg/dL   BUN 13 8 - 27 mg/dL   Creatinine, Ser 0.73 0.57 - 1.00 mg/dL   GFR calc non Af Amer 85 >59 mL/min/1.73   GFR calc Af Amer 99 >59 mL/min/1.73   BUN/Creatinine Ratio 18 12 - 28   Sodium 135 134 - 144 mmol/L   Potassium 3.9 3.5 - 5.2 mmol/L   Chloride 93 (L) 96 - 106 mmol/L   CO2 27 20 - 29 mmol/L   Calcium 9.4 8.7 - 10.3 mg/dL   Total Protein 6.8 6.0 - 8.5 g/dL   Albumin 4.7 3.6 - 4.8 g/dL   Globulin, Total 2.1 1.5 - 4.5 g/dL   Albumin/Globulin Ratio 2.2 1.2 - 2.2   Bilirubin Total 0.3 0.0 - 1.2 mg/dL   Alkaline Phosphatase 64 39 - 117 IU/L   AST 18 0 - 40 IU/L   ALT 20 0 - 32 IU/L  Lipid Panel w/o Chol/HDL Ratio  Result Value Ref Range   Cholesterol, Total 144 100 - 199 mg/dL   Triglycerides 163 (H) 0 - 149 mg/dL   HDL 56 >39 mg/dL   VLDL Cholesterol Cal 33 5 - 40 mg/dL   LDL Calculated 55 0 - 99 mg/dL  Microalbumin, Urine Waived  Result Value Ref Range   Microalb, Ur Waived 10 0 - 19 mg/L   Creatinine, Urine Waived 10 10 - 300 mg/dL   Microalb/Creat Ratio <30 <30 mg/g      Assessment & Plan:   Problem List Items Addressed This Visit      Cardiovascular and Mediastinum   Hypertension    Under good control on current regimen. Continue current regimen. Continue to monitor. Call with any concerns. Refills given. Labs  to be drawn shortly.        Relevant Orders   Comprehensive metabolic panel     Nervous and Auditory   Parkinson disease ([place])     Other   Hyperlipidemia    Under good control on current regimen. Continue current regimen. Continue to monitor. Call with any concerns. Refills given. Labs to be drawn shortly.       Relevant Orders   Comprehensive metabolic panel   Lipid Panel w/o Chol/HDL Ratio   Depression, recurrent ([place]) - Primary    Can't get in with a counselor. We will see if the LCSW can help arrange this. Referral generated today. Call with any concerns.       Relevant Orders   Referral to Chronic Care Management Services    Other Visit Diagnoses    Dysuria       Will get UA and treat as needed. Await results.    Relevant Orders   UA/M w/rflx Culture, Routine       Follow up plan: Return in about 6 months (around 08/10/2019) for Physical.    . This visit was completed via FaceTime due to the restrictions of the COVID-19 pandemic. All issues as above were discussed and addressed. Physical exam was done as above through visual confirmation on FaceTime. If it was felt that the patient should be evaluated in the office, they were directed there. The patient verbally consented to this visit. . Location of the patient: home . Location of the provider: home . Those involved with this call:  . Provider: Park Liter, DO . CMA: Merilyn Baba, CMA . Front Desk/Registration: Linard Millers  . Time spent on call: 25  minutes with patient face to face via video conference. More than 50% of this time was spent in counseling and coordination of care.

## 2019-02-06 ENCOUNTER — Telehealth: Payer: Self-pay

## 2019-02-06 NOTE — Telephone Encounter (Signed)
Patient scheduled for an AWV on 02/08/2019 with [facility], Due to Covid-19 pandemic this is unable to be done in office, called patient to see if she was able to do this virtually. Left message with patient to call back. Call back number is [phone]. Or Jill Alexanders at [phone]

## 2019-02-06 NOTE — Telephone Encounter (Signed)
Patient called back and is able to complete AWV virtually via skype on 02/08/2019. appt time at 1:00, patient understands I will call her at her appt time to go through the steps of this meeting. e-mail sent with meeting instructions.

## 2019-02-07 DIAGNOSIS — Z9181 History of falling: Secondary | ICD-10-CM | POA: Diagnosis not present

## 2019-02-07 DIAGNOSIS — Z6823 Body mass index (BMI) 23.0-23.9, adult: Secondary | ICD-10-CM | POA: Diagnosis not present

## 2019-02-07 DIAGNOSIS — G20 Parkinson's disease: Secondary | ICD-10-CM | POA: Diagnosis not present

## 2019-02-08 ENCOUNTER — Ambulatory Visit: Payer: Medicare Other

## 2019-02-08 ENCOUNTER — Other Ambulatory Visit: Payer: Self-pay

## 2019-02-08 ENCOUNTER — Ambulatory Visit (INDEPENDENT_AMBULATORY_CARE_PROVIDER_SITE_OTHER): Payer: Medicare Other | Admitting: Family Medicine

## 2019-02-08 ENCOUNTER — Encounter: Payer: Self-pay | Admitting: Family Medicine

## 2019-02-08 VITALS — BP 106/68 | HR 85 | Ht 67.0 in | Wt 155.0 lb

## 2019-02-08 DIAGNOSIS — G20 Parkinson's disease: Secondary | ICD-10-CM | POA: Diagnosis not present

## 2019-02-08 DIAGNOSIS — F339 Major depressive disorder, recurrent, unspecified: Secondary | ICD-10-CM | POA: Diagnosis not present

## 2019-02-08 DIAGNOSIS — G20A1 Parkinson's disease without dyskinesia, without mention of fluctuations: Secondary | ICD-10-CM

## 2019-02-08 DIAGNOSIS — R300 Dysuria: Secondary | ICD-10-CM

## 2019-02-08 DIAGNOSIS — I10 Essential (primary) hypertension: Secondary | ICD-10-CM

## 2019-02-08 DIAGNOSIS — E782 Mixed hyperlipidemia: Secondary | ICD-10-CM | POA: Diagnosis not present

## 2019-02-08 NOTE — Assessment & Plan Note (Signed)
Can't get in with a counselor. We will see if the LCSW can help arrange this. Referral generated today. Call with any concerns.

## 2019-02-08 NOTE — Assessment & Plan Note (Signed)
Under good control on current regimen. Continue current regimen. Continue to monitor. Call with any concerns. Refills given. Labs to be drawn shortly.  

## 2019-02-12 DIAGNOSIS — Z9181 History of falling: Secondary | ICD-10-CM | POA: Diagnosis not present

## 2019-02-12 DIAGNOSIS — G20 Parkinson's disease: Secondary | ICD-10-CM | POA: Diagnosis not present

## 2019-02-12 DIAGNOSIS — Z6823 Body mass index (BMI) 23.0-23.9, adult: Secondary | ICD-10-CM | POA: Diagnosis not present

## 2019-02-13 ENCOUNTER — Other Ambulatory Visit: Payer: Self-pay | Admitting: Family Medicine

## 2019-02-14 DIAGNOSIS — G20 Parkinson's disease: Secondary | ICD-10-CM | POA: Diagnosis not present

## 2019-02-14 DIAGNOSIS — Z9181 History of falling: Secondary | ICD-10-CM | POA: Diagnosis not present

## 2019-02-14 DIAGNOSIS — Z6823 Body mass index (BMI) 23.0-23.9, adult: Secondary | ICD-10-CM | POA: Diagnosis not present

## 2019-02-15 ENCOUNTER — Other Ambulatory Visit: Payer: Self-pay

## 2019-02-15 ENCOUNTER — Other Ambulatory Visit: Payer: Medicaid Other

## 2019-02-15 DIAGNOSIS — R300 Dysuria: Secondary | ICD-10-CM

## 2019-02-15 DIAGNOSIS — E782 Mixed hyperlipidemia: Secondary | ICD-10-CM | POA: Diagnosis not present

## 2019-02-15 DIAGNOSIS — I10 Essential (primary) hypertension: Secondary | ICD-10-CM | POA: Diagnosis not present

## 2019-02-15 LAB — MICROSCOPIC EXAMINATION
Bacteria, UA: NONE SEEN
WBC, UA: NONE SEEN /hpf (ref 0–5)

## 2019-02-15 LAB — UA/M W/RFLX CULTURE, ROUTINE
Bilirubin, UA: NEGATIVE
Glucose, UA: NEGATIVE
Ketones, UA: NEGATIVE
Leukocytes,UA: NEGATIVE
Nitrite, UA: NEGATIVE
Protein,UA: NEGATIVE
Specific Gravity, UA: 1.01 (ref 1.005–1.030)
Urobilinogen, Ur: 0.2 mg/dL (ref 0.2–1.0)
pH, UA: 6 (ref 5.0–7.5)

## 2019-02-16 ENCOUNTER — Encounter: Payer: Self-pay | Admitting: Family Medicine

## 2019-02-16 LAB — COMPREHENSIVE METABOLIC PANEL
ALT: 8 IU/L (ref 0–32)
AST: 17 IU/L (ref 0–40)
Albumin/Globulin Ratio: 2 (ref 1.2–2.2)
Albumin: 4.7 g/dL (ref 3.8–4.8)
Alkaline Phosphatase: 70 IU/L (ref 39–117)
BUN/Creatinine Ratio: 22 (ref 12–28)
BUN: 16 mg/dL (ref 8–27)
Bilirubin Total: 0.3 mg/dL (ref 0.0–1.2)
CO2: 28 mmol/L (ref 20–29)
Calcium: 9.5 mg/dL (ref 8.7–10.3)
Chloride: 91 mmol/L — ABNORMAL LOW (ref 96–106)
Creatinine, Ser: 0.72 mg/dL (ref 0.57–1.00)
GFR calc Af Amer: 100 mL/min/{1.73_m2} (ref >59)
GFR calc non Af Amer: 87 mL/min/{1.73_m2} (ref >59)
Globulin, Total: 2.3 g/dL (ref 1.5–4.5)
Glucose: 101 mg/dL — ABNORMAL HIGH (ref 65–99)
Potassium: 4 mmol/L (ref 3.5–5.2)
Sodium: 135 mmol/L (ref 134–144)
Total Protein: 7 g/dL (ref 6.0–8.5)

## 2019-02-16 LAB — LIPID PANEL W/O CHOL/HDL RATIO
Cholesterol, Total: 150 mg/dL (ref 100–199)
HDL: 67 mg/dL (ref >39)
LDL Calculated: 62 mg/dL (ref 0–99)
Triglycerides: 107 mg/dL (ref 0–149)
VLDL Cholesterol Cal: 21 mg/dL (ref 5–40)

## 2019-02-19 ENCOUNTER — Other Ambulatory Visit: Payer: Self-pay

## 2019-02-19 ENCOUNTER — Ambulatory Visit (INDEPENDENT_AMBULATORY_CARE_PROVIDER_SITE_OTHER): Payer: Medicare Other | Admitting: Licensed Clinical Social Worker

## 2019-02-19 DIAGNOSIS — I10 Essential (primary) hypertension: Secondary | ICD-10-CM | POA: Diagnosis not present

## 2019-02-19 DIAGNOSIS — F339 Major depressive disorder, recurrent, unspecified: Secondary | ICD-10-CM | POA: Diagnosis not present

## 2019-02-19 DIAGNOSIS — G20 Parkinson's disease: Secondary | ICD-10-CM

## 2019-02-19 NOTE — Patient Instructions (Signed)
Licensed Clinical Social Worker Visit Information  Goals we discussed today:  Goals Addressed    . "I want to find a counselor" (pt-stated)       Current Barriers:  [name] Mental Health Concerns  . Cognitive Deficits . Lacks knowledge of community resource: available mental health support resources that accept her insurance  Clinical Social Work Clinical Goal(s):  [name] Over the next 90 days, client will work with SW to address concerns related to depression . Over the next 90 days, client will follow up with provided mental health resources that were emailed to patient on 02/19/2019* as directed by SW  Interventions: . Patient interviewed and appropriate assessments performed . Provided mental health counseling with regard to depression  . Provided patient with information about mental health support resources that accept her insurance . Discussed plans with patient for ongoing care management follow up and provided patient with direct contact information for care management team . Advised patient to check her email for mailed resources that LCSW sent out on 02/19/2019 . Assisted patient/caregiver with obtaining information about health plan benefits  Patient Self Care Activities:  . Currently UNABLE TO independently find a counselor that accepts her insurance . Attends all scheduled provider appointments . Calls provider office for new concerns or questions  Initial goal documentation         Materials provided: Verbal education about mental health support resources provided by phone  Ms. Voyles was given information about Chronic Care Management services today including:  1. CCM service includes personalized support from designated clinical staff supervised by her physician, including individualized plan of care and coordination with other care providers 2. 24/7 contact phone numbers for assistance for urgent and routine care needs. 3. Service will only be billed when office clinical  staff spend 20 minutes or more in a month to coordinate care. 4. Only one practitioner may furnish and bill the service in a calendar month. 5. The patient may stop CCM services at any time (effective at the end of the month) by phone call to the office staff. 6. The patient will be responsible for cost sharing (co-pay) of up to 20% of the service fee (after annual deductible is met).  Patient agreed to services and verbal consent obtained.   The patient verbalized understanding of instructions provided today and declined a print copy of patient instruction materials.   Follow up plan: SW will follow up with patient by phone over the next 2-3 weeks  [name], [place], MSW, [email or web address] Phone: [phone]

## 2019-02-19 NOTE — Chronic Care Management (AMB) (Signed)
  Chronic Care Management    Clinical Social Work General Note  02/19/2019 Name: Melissa Wise MRN: [id number] DOB: [birth year]  Melissa Wise is a 68 y.o. year old female who is a primary care patient of [name], DO. The CCM was consulted to assist the patient with Mental Health Counseling and Resources.   [name] was given information about Chronic Care Management services today including:  1. CCM service includes personalized support from designated clinical staff supervised by her physician, including individualized plan of care and coordination with other care providers 2. 24/7 contact phone numbers for assistance for urgent and routine care needs. 3. Service will only be billed when office clinical staff spend 20 minutes or more in a month to coordinate care. 4. Only one practitioner may furnish and bill the service in a calendar month. 5. The patient may stop CCM services at any time (effective at the end of the month) by phone call to the office staff. 6. The patient will be responsible for cost sharing (co-pay) of up to 20% of the service fee (after annual deductible is met).  Patient agreed to services and verbal consent obtained.   Review of patient status, including review of consultants reports, relevant laboratory and other test results, and collaboration with appropriate care team members and the patient's provider was performed as part of comprehensive patient evaluation and provision of chronic care management services.    Goals Addressed    . "I want to find a counselor" (pt-stated)       Current Barriers:  [name] Mental Health Concerns  . Cognitive Deficits . Lacks knowledge of community resource: available mental health support resources that accept her insurance  Clinical Social Work Clinical Goal(s):  [name] Over the next 90 days, client will work with SW to address concerns related to depression . Over the next 90 days, client will follow up with provided mental  health resources that were emailed to patient on 02/19/2019* as directed by SW  Interventions: . Patient interviewed and appropriate assessments performed . Provided mental health counseling with regard to depression  . Provided patient with information about mental health support resources that accept her insurance . Discussed plans with patient for ongoing care management follow up and provided patient with direct contact information for care management team . Advised patient to check her email for mailed resources that LCSW sent out on 02/19/2019 . Assisted patient/caregiver with obtaining information about health plan benefits  Patient Self Care Activities:  . Currently UNABLE TO independently find a counselor that accepts her insurance . Attends all scheduled provider appointments . Calls provider office for new concerns or questions  Initial goal documentation    Follow Up Plan: SW will follow up with patient by phone over the next 2-3 weeks      [name], [place], MSW, [email or web address] Phone: [phone]

## 2019-02-20 DIAGNOSIS — Z6823 Body mass index (BMI) 23.0-23.9, adult: Secondary | ICD-10-CM | POA: Diagnosis not present

## 2019-02-20 DIAGNOSIS — Z9181 History of falling: Secondary | ICD-10-CM | POA: Diagnosis not present

## 2019-02-20 DIAGNOSIS — G20 Parkinson's disease: Secondary | ICD-10-CM | POA: Diagnosis not present

## 2019-02-21 ENCOUNTER — Telehealth: Payer: Self-pay | Admitting: Family Medicine

## 2019-02-21 NOTE — Telephone Encounter (Signed)
Copied from [place] [id number]. Topic: Quick Communication - See Telephone Encounter >> Feb 21, 2019 11:37 AM Ahmed Prima L wrote: CRM for notification. See Telephone encounter for: 02/21/19.  Pt calling for lab results from 4/9

## 2019-02-21 NOTE — Telephone Encounter (Signed)
Letter sent by Dr. Wynetta Emery states labs are nice and normal. Patient notified of this.

## 2019-02-27 ENCOUNTER — Telehealth: Payer: Self-pay | Admitting: Family Medicine

## 2019-02-27 DIAGNOSIS — Z6823 Body mass index (BMI) 23.0-23.9, adult: Secondary | ICD-10-CM | POA: Diagnosis not present

## 2019-02-27 DIAGNOSIS — Z9181 History of falling: Secondary | ICD-10-CM | POA: Diagnosis not present

## 2019-02-27 DIAGNOSIS — G20 Parkinson's disease: Secondary | ICD-10-CM | POA: Diagnosis not present

## 2019-02-27 NOTE — Telephone Encounter (Signed)
Verbal orders given  

## 2019-02-27 NOTE — Telephone Encounter (Signed)
OK to give verbal orders 

## 2019-02-27 NOTE — Telephone Encounter (Signed)
Copied from [place] [id number]. Topic: Quick Communication - Home Health Verbal Orders >> Feb 27, 2019  9:51 AM Loma [place] wrote: Kathlee Nations at [place] Number:[phone] Requesting  Orders for PT run out today and wanting to know if she can switch to a 1 a week or every 2 wk maintenance due to the issues client is having in order to keep her stablized Frequency: 1 every week or every two wks

## 2019-03-05 ENCOUNTER — Telehealth: Payer: Self-pay

## 2019-03-05 ENCOUNTER — Ambulatory Visit: Payer: Self-pay | Admitting: Licensed Clinical Social Worker

## 2019-03-05 ENCOUNTER — Other Ambulatory Visit: Payer: Self-pay

## 2019-03-05 DIAGNOSIS — F339 Major depressive disorder, recurrent, unspecified: Secondary | ICD-10-CM

## 2019-03-05 NOTE — Patient Instructions (Signed)
Licensed Clinical Social Worker Visit Information  Goals we discussed today:  Goals Addressed    . "I want to find a counselor" (pt-stated)       Current Barriers:  [name] Mental Health Concerns  . Cognitive Deficits . Lacks knowledge of community resource: available mental health support resources that accept her insurance  Clinical Social Work Clinical Goal(s):  [name] Over the next 90 days, client will work with SW to address concerns related to depression . Over the next 90 days, client will follow up with provided mental health resources that were emailed to patient on 02/19/2019 and again on 03/05/2019  Interventions: . Patient interviewed and appropriate assessments performed . Provided mental health counseling with regard to depression  . Provided patient with information about mental health support resources that accept her insurance . Discussed plans with patient for ongoing care management follow up and provided patient with direct contact information for care management team . Advised patient to check her email for mental health resources that LCSW sent again on 03/05/2019 for her to review and investigate  . Assisted patient/caregiver with obtaining information about health plan benefits  Patient Self Care Activities:  . Currently UNABLE TO independently find a counselor that accepts her insurance . Attends all scheduled provider appointments . Calls provider office for new concerns or questions  Please see past updates related to this goal by clicking on the "Past Updates" button in the selected goal    Materials Provided: Yes: mental health resource education provided and email sent with resources that were discussed today  Follow Up Plan: SW will follow up with patient by phone over the next 30 days  [name], [place], MSW, [email or web address] Phone: [phone]

## 2019-03-05 NOTE — Chronic Care Management (AMB) (Signed)
  Care Management Note   Melissa Wise is a 68 y.o. year old female who is a primary care patient of [name], DO. The CM team was consulted for assistance with chronic disease management and care coordination.   I reached out to [employer] by phone today.   Review of patient status, including review of consultants reports, relevant laboratory and other test results, and collaboration with appropriate care team members and the patient's provider was performed as part of comprehensive patient evaluation and provision of chronic care management services.  Goals Addressed    . "I want to find a counselor" (pt-stated)       Current Barriers:  [name] Mental Health Concerns  . Cognitive Deficits . Lacks knowledge of community resource: available mental health support resources that accept her insurance  Clinical Social Work Clinical Goal(s):  [name] Over the next 90 days, client will work with SW to address concerns related to depression . Over the next 90 days, client will follow up with provided mental health resources that were emailed to patient on 02/19/2019 and again on 03/05/2019  Interventions: . Patient interviewed and appropriate assessments performed . Provided mental health counseling with regard to depression  . Provided patient with information about mental health support resources that accept her insurance . Discussed plans with patient for ongoing care management follow up and provided patient with direct contact information for care management team . Advised patient to check her email for mental health resources that LCSW sent again on 03/05/2019 for her to review and investigate  . Assisted patient/caregiver with obtaining information about health plan benefits  Patient Self Care Activities:  . Currently UNABLE TO independently find a counselor that accepts her insurance . Attends all scheduled provider appointments . Calls provider office for new concerns or questions   Please see past updates related to this goal by clicking on the "Past Updates" button in the selected goal    Follow Up Plan: The CM team will reach out to the patient again over the next 30 days.   [name], BSW, MSW, [facility] Care [facility] [email or web address] Phone: [phone]

## 2019-03-08 DIAGNOSIS — G20 Parkinson's disease: Secondary | ICD-10-CM | POA: Diagnosis not present

## 2019-03-08 DIAGNOSIS — Z9181 History of falling: Secondary | ICD-10-CM | POA: Diagnosis not present

## 2019-03-08 DIAGNOSIS — Z6823 Body mass index (BMI) 23.0-23.9, adult: Secondary | ICD-10-CM | POA: Diagnosis not present

## 2019-03-14 DIAGNOSIS — Z6823 Body mass index (BMI) 23.0-23.9, adult: Secondary | ICD-10-CM | POA: Diagnosis not present

## 2019-03-14 DIAGNOSIS — Z9181 History of falling: Secondary | ICD-10-CM | POA: Diagnosis not present

## 2019-03-14 DIAGNOSIS — G20 Parkinson's disease: Secondary | ICD-10-CM | POA: Diagnosis not present

## 2019-03-21 DIAGNOSIS — G20 Parkinson's disease: Secondary | ICD-10-CM | POA: Diagnosis not present

## 2019-03-21 DIAGNOSIS — Z9181 History of falling: Secondary | ICD-10-CM | POA: Diagnosis not present

## 2019-03-21 DIAGNOSIS — Z6823 Body mass index (BMI) 23.0-23.9, adult: Secondary | ICD-10-CM | POA: Diagnosis not present

## 2019-03-26 ENCOUNTER — Other Ambulatory Visit: Payer: Self-pay

## 2019-03-26 ENCOUNTER — Telehealth: Payer: Self-pay

## 2019-03-26 ENCOUNTER — Ambulatory Visit: Payer: Self-pay | Admitting: Licensed Clinical Social Worker

## 2019-03-26 DIAGNOSIS — F339 Major depressive disorder, recurrent, unspecified: Secondary | ICD-10-CM

## 2019-03-26 NOTE — Chronic Care Management (AMB) (Signed)
  Care Management Note   Melissa Wise is a 68 y.o. year old female who is a primary care patient of [name], DO. The CM team was consulted for assistance with chronic disease management and care coordination.   I reached out to [employer] by phone today.   Review of patient status, including review of consultants reports, relevant laboratory and other test results, and collaboration with appropriate care team members and the patient's provider was performed as part of comprehensive patient evaluation and provision of chronic care management services.   Goals Addressed    . "I want to find a counselor" (pt-stated)       Current Barriers:  [name] Mental Health Concerns  . Cognitive Deficits . Lacks knowledge of community resource: available mental health support resources that accept her insurance  Clinical Social Work Clinical Goal(s):  [name] Over the next 90 days, client will work with SW to address concerns related to depression . Over the next 90 days, client will follow up with provided mental health resources that were emailed to patient  Interventions: . Patient interviewed and appropriate assessments performed . Provided mental health counseling with regard to depression  . Provided patient with information about mental health support resources that accept her insurance. Patient reports that she is still having issues with finding a counselor that accepts her insurance. LCSW advised patient that her primary care insurance is not [facility] but Medicaid and that she has several counseling options that accept her insurance. . Discussed plans with patient for ongoing care management follow up and provided patient with direct contact information for care management team . Advised patient to investigate mental health resources that LCSW sent out. Education provided on these resources again as well. [name] LCSW provided positive reinforcement for patient's recent implementation of healthy  coping tools to combat depressive symptoms . Assisted patient/caregiver with obtaining information about health plan benefits  Patient Self Care Activities:  . Currently UNABLE TO independently find a counselor that accepts her insurance . Attends all scheduled provider appointments . Calls provider office for new concerns or questions  Please see past updates related to this goal by clicking on the "Past Updates" button in the selected goal    Follow Up Plan: The CM team will reach out to the patient again next month to assess social work needs.  [name], BSW, MSW, [facility] Care [facility] [email or web address] Phone: [phone]

## 2019-03-28 DIAGNOSIS — Z6823 Body mass index (BMI) 23.0-23.9, adult: Secondary | ICD-10-CM | POA: Diagnosis not present

## 2019-03-28 DIAGNOSIS — Z9181 History of falling: Secondary | ICD-10-CM | POA: Diagnosis not present

## 2019-03-28 DIAGNOSIS — G20 Parkinson's disease: Secondary | ICD-10-CM | POA: Diagnosis not present

## 2019-03-29 ENCOUNTER — Telehealth: Payer: Self-pay | Admitting: Family Medicine

## 2019-03-29 DIAGNOSIS — R200 Anesthesia of skin: Secondary | ICD-10-CM | POA: Diagnosis not present

## 2019-03-29 DIAGNOSIS — G20 Parkinson's disease: Secondary | ICD-10-CM | POA: Diagnosis not present

## 2019-03-29 NOTE — Telephone Encounter (Signed)
Liz notified.

## 2019-03-29 NOTE — Telephone Encounter (Signed)
We don't even have that on her medication list. Can you find out who wrote it?

## 2019-03-29 NOTE — Telephone Encounter (Signed)
Perfect. Can you let Kathlee Nations know that it's OK she's not taking it? Thanks!

## 2019-03-29 NOTE — Telephone Encounter (Signed)
Dr.Potter, looks like she stopped taking it in March, tried it for two weeks and stopped because it was causing her to feel off balance. Dr. Melrose Nakayama is aware.

## 2019-03-29 NOTE — Telephone Encounter (Signed)
Copied from [place] [phone]. Topic: General - Other >> Mar 28, 2019 12:16 PM Rainey Pines A wrote: Melissa Wise at [facility] called to make Melissa Wise aware that patient stated she hasnt been taking her Terazosin for a little over a month because it makes her feel off balance. Melissa Wise would like a callback from [facility] or nurse in regards to this at [phone].

## 2019-04-04 DIAGNOSIS — Z9181 History of falling: Secondary | ICD-10-CM | POA: Diagnosis not present

## 2019-04-04 DIAGNOSIS — G20 Parkinson's disease: Secondary | ICD-10-CM | POA: Diagnosis not present

## 2019-04-04 DIAGNOSIS — Z6823 Body mass index (BMI) 23.0-23.9, adult: Secondary | ICD-10-CM | POA: Diagnosis not present

## 2019-04-11 DIAGNOSIS — Z6823 Body mass index (BMI) 23.0-23.9, adult: Secondary | ICD-10-CM | POA: Diagnosis not present

## 2019-04-11 DIAGNOSIS — G20 Parkinson's disease: Secondary | ICD-10-CM | POA: Diagnosis not present

## 2019-04-11 DIAGNOSIS — Z9181 History of falling: Secondary | ICD-10-CM | POA: Diagnosis not present

## 2019-04-12 ENCOUNTER — Ambulatory Visit (INDEPENDENT_AMBULATORY_CARE_PROVIDER_SITE_OTHER): Payer: Medicare Other

## 2019-04-12 VITALS — BP 114/78 | HR 87 | Ht 67.5 in | Wt 155.0 lb

## 2019-04-12 DIAGNOSIS — Z0000 Encounter for general adult medical examination without abnormal findings: Secondary | ICD-10-CM

## 2019-04-12 NOTE — Patient Instructions (Addendum)
Melissa Wise , Thank you for taking time to come for your Medicare Wellness Visit. I appreciate your ongoing commitment to your health goals. Please review the following plan we discussed and let me know if I can assist you in the future.   Screening recommendations/referrals: Colonoscopy: cologuard completed 11/2017, due 11/2020 Mammogram: postponed until 07/2018 Bone Density: completed 02/14/2018 Recommended yearly ophthalmology/optometry visit for glaucoma screening and checkup Recommended yearly dental visit for hygiene and checkup  Vaccinations: Influenza vaccine: up to date Pneumococcal vaccine: up to date Tdap vaccine: up to date Shingles vaccine: shingrix eligible, check with your insurance company for coverage     Advanced directives: Please bring a copy of your health care power of attorney and living will to the office at your convenience.  Conditions/risks identified: fall preventions discussed  Next appointment: Follow up in one year for your annual wellness exam.   Preventive Care 68 Years and Older, Female Preventive care refers to lifestyle choices and visits with your health care provider that can promote health and wellness. What does preventive care include?  A yearly physical exam. This is also called an annual well check.  Dental exams once or twice a year.  Routine eye exams. Ask your health care provider how often you should have your eyes checked.  Personal lifestyle choices, including:  Daily care of your teeth and gums.  Regular physical activity.  Eating a healthy diet.  Avoiding tobacco and drug use.  Limiting alcohol use.  Practicing safe sex.  Taking low-dose aspirin every day.  Taking vitamin and mineral supplements as recommended by your health care provider. What happens during an annual well check? The services and screenings done by your health care provider during your annual well check will depend on your age, overall health, lifestyle  risk factors, and family history of disease. Counseling  Your health care provider may ask you questions about your:  Alcohol use.  Tobacco use.  Drug use.  Emotional well-being.  Home and relationship well-being.  Sexual activity.  Eating habits.  History of falls.  Memory and ability to understand (cognition).  Work and work Statistician.  Reproductive health. Screening  You may have the following tests or measurements:  Height, weight, and BMI.  Blood pressure.  Lipid and cholesterol levels. These may be checked every 5 years, or more frequently if you are over 34 years old.  Skin check.  Lung cancer screening. You may have this screening every year starting at age 68 if you have a 30-pack-year history of smoking and currently smoke or have quit within the past 15 years.  Fecal occult blood test (FOBT) of the stool. You may have this test every year starting at age 68.  Flexible sigmoidoscopy or colonoscopy. You may have a sigmoidoscopy every 5 years or a colonoscopy every 10 years starting at age 68.  Hepatitis C blood test.  Hepatitis B blood test.  Sexually transmitted disease (STD) testing.  Diabetes screening. This is done by checking your blood sugar (glucose) after you have not eaten for a while (fasting). You may have this done every 1-3 years.  Bone density scan. This is done to screen for osteoporosis. You may have this done starting at age 68.  Mammogram. This may be done every 1-2 years. Talk to your health care provider about how often you should have regular mammograms. Talk with your health care provider about your test results, treatment options, and if necessary, the need for more tests. Vaccines  Your  health care provider may recommend certain vaccines, such as:  Influenza vaccine. This is recommended every year.  Tetanus, diphtheria, and acellular pertussis (Tdap, Td) vaccine. You may need a Td booster every 10 years.  Zoster vaccine.  You may need this after age 68.  Pneumococcal 13-valent conjugate (PCV13) vaccine. One dose is recommended after age 68.  Pneumococcal polysaccharide (PPSV23) vaccine. One dose is recommended after age 68. Talk to your health care provider about which screenings and vaccines you need and how often you need them. This information is not intended to replace advice given to you by your health care provider. Make sure you discuss any questions you have with your health care provider. Document Released: 11/21/2015 Document Revised: 07/14/2016 Document Reviewed: 08/26/2015 Elsevier Interactive Patient Education  2017 [place] Prevention in the Home Falls can cause injuries. They can happen to people of all ages. There are many things you can do to make your home safe and to help prevent falls. What can I do on the outside of my home?  Regularly fix the edges of walkways and driveways and fix any cracks.  Remove anything that might make you trip as you walk through a door, such as a raised step or threshold.  Trim any bushes or trees on the path to your home.  Use bright outdoor lighting.  Clear any walking paths of anything that might make someone trip, such as rocks or tools.  Regularly check to see if handrails are loose or broken. Make sure that both sides of any steps have handrails.  Any raised decks and porches should have guardrails on the edges.  Have any leaves, snow, or ice cleared regularly.  Use sand or salt on walking paths during winter.  Clean up any spills in your garage right away. This includes oil or grease spills. What can I do in the bathroom?  Use night lights.  Install grab bars by the toilet and in the tub and shower. Do not use towel bars as grab bars.  Use non-skid mats or decals in the tub or shower.  If you need to sit down in the shower, use a plastic, non-slip stool.  Keep the floor dry. Clean up any water that spills on the floor as soon  as it happens.  Remove soap buildup in the tub or shower regularly.  Attach bath mats securely with double-sided non-slip rug tape.  Do not have throw rugs and other things on the floor that can make you trip. What can I do in the bedroom?  Use night lights.  Make sure that you have a light by your bed that is easy to reach.  Do not use any sheets or blankets that are too big for your bed. They should not hang down onto the floor.  Have a firm chair that has side arms. You can use this for support while you get dressed.  Do not have throw rugs and other things on the floor that can make you trip. What can I do in the kitchen?  Clean up any spills right away.  Avoid walking on wet floors.  Keep items that you use a lot in easy-to-reach places.  If you need to reach something above you, use a strong step stool that has a grab bar.  Keep electrical cords out of the way.  Do not use floor polish or wax that makes floors slippery. If you must use wax, use non-skid floor wax.  Do not  have throw rugs and other things on the floor that can make you trip. What can I do with my stairs?  Do not leave any items on the stairs.  Make sure that there are handrails on both sides of the stairs and use them. Fix handrails that are broken or loose. Make sure that handrails are as long as the stairways.  Check any carpeting to make sure that it is firmly attached to the stairs. Fix any carpet that is loose or worn.  Avoid having throw rugs at the top or bottom of the stairs. If you do have throw rugs, attach them to the floor with carpet tape.  Make sure that you have a light switch at the top of the stairs and the bottom of the stairs. If you do not have them, ask someone to add them for you. What else can I do to help prevent falls?  Wear shoes that:  Do not have high heels.  Have rubber bottoms.  Are comfortable and fit you well.  Are closed at the toe. Do not wear sandals.  If  you use a stepladder:  Make sure that it is fully opened. Do not climb a closed stepladder.  Make sure that both sides of the stepladder are locked into place.  Ask someone to hold it for you, if possible.  Clearly mark and make sure that you can see:  Any grab bars or handrails.  First and last steps.  Where the edge of each step is.  Use tools that help you move around (mobility aids) if they are needed. These include:  Canes.  Walkers.  Scooters.  Crutches.  Turn on the lights when you go into a dark area. Replace any light bulbs as soon as they burn out.  Set up your furniture so you have a clear path. Avoid moving your furniture around.  If any of your floors are uneven, fix them.  If there are any pets around you, be aware of where they are.  Review your medicines with your doctor. Some medicines can make you feel dizzy. This can increase your chance of falling. Ask your doctor what other things that you can do to help prevent falls. This information is not intended to replace advice given to you by your health care provider. Make sure you discuss any questions you have with your health care provider. Document Released: 08/21/2009 Document Revised: 04/01/2016 Document Reviewed: 11/29/2014 Elsevier Interactive Patient Education  2017 [employer].

## 2019-04-12 NOTE — Progress Notes (Signed)
Subjective:   Melissa Wise is a 68 y.o. female who presents for Medicare Annual (Subsequent) preventive examination.  This visit is being conducted via phone call  - after an attmept to do on video chat - due to the COVID-19 pandemic. This patient has given me verbal consent via phone to conduct this visit, patient states they are participating from their home address. Some vital signs may be absent or patient reported.   Patient identification: identified by name, DOB, and current address.    Review of Systems:   Cardiac Risk Factors include: advanced age (>44men, >57 women);hypertension;dyslipidemia     Objective:     Vitals: BP 114/78 Comment: patient reported  Pulse 87 Comment: patient reported  Ht 5' 7.5" (1.715 m) Comment: patient reported  Wt 155 lb (70.3 kg) Comment: patient reported  BMI 23.92 kg/m   Body mass index is 23.92 kg/m.  Advanced Directives 04/12/2019 10/27/2017 10/21/2016 07/16/2016  Does Patient Have a Medical Advance Directive? Yes No No No  Type of Paramedic of [place];Living will - - -  Does patient want to make changes to medical advance directive? - Yes (MAU/Ambulatory/Procedural Areas - Information given) - -  Copy of [place] in Chart? No - copy requested - - -  Would patient like information on creating a medical advance directive? - - Yes (MAU/Ambulatory/Procedural Areas - Information given) No - patient declined information    Tobacco Social History   Tobacco Use  Smoking Status Never Smoker  Smokeless Tobacco Never Used     Counseling given: Not Answered   Clinical Intake:  Pre-visit preparation completed: Yes  Pain : No/denies pain     Nutritional Status: BMI of 19-24  Normal Nutritional Risks: None Diabetes: No  How often do you need to have someone help you when you read instructions, pamphlets, or other written materials from your doctor or pharmacy?: 1 - Never What is the last  grade level you completed in school?: 1 year college   Interpreter Needed?: No  Information entered by :: Juda Toepfer,LPN   Past Medical History:  Diagnosis Date  . Allergy   . Ataxia   . GERD (gastroesophageal reflux disease)   . Hyperglycemia   . Hyperlipidemia   . Hypertension   . Insomnia   . Low back pain    Past Surgical History:  Procedure Laterality Date  . ABDOMINAL HYSTERECTOMY  1991   Complete, no cancer  . CATARACT EXTRACTION Left   . SHOULDER SURGERY Right 2015   Family History  Problem Relation Age of Onset  . Cancer Mother   . Hypertension Father   . Heart attack Father   . Diabetes Maternal Grandmother   . Cancer Paternal Grandmother        Stomach   Social History   Socioeconomic History  . Marital status: Single    Spouse name: Not on file  . Number of children: Not on file  . Years of education: 1 year college   . Highest education level: Some college, no degree  Occupational History  . Not on file  Social Needs  . Financial resource strain: Not hard at all  . Food insecurity:    Worry: Never true    Inability: Never true  . Transportation needs:    Medical: No    Non-medical: No  Tobacco Use  . Smoking status: Never Smoker  . Smokeless tobacco: Never Used  Substance and Sexual Activity  . Alcohol  use: Yes    Alcohol/week: 1.0 standard drinks    Types: 1 Glasses of wine per week    Comment: occassionally  . Drug use: No  . Sexual activity: Not Currently  Lifestyle  . Physical activity:    Days per week: 7 days    Minutes per session: 40 min  . Stress: Not at all  Relationships  . Social connections:    Talks on phone: More than three times a week    Gets together: Three times a week    Attends religious service: 1 to 4 times per year    Active member of club or organization: No    Attends meetings of clubs or organizations: Never    Relationship status: Never married  Other Topics Concern  . Not on file  Social History  Narrative  . Not on file    Outpatient Encounter Medications as of 04/12/2019  Medication Sig  . carbidopa-levodopa (SINEMET IR) 25-100 MG tablet   . COCONUT OIL PO Take by mouth.  . FIBER ADULT GUMMIES PO Take by mouth.  . hydrochlorothiazide (HYDRODIURIL) 25 MG tablet TAKE 1 TABLET(25 MG) BY MOUTH DAILY  . losartan (COZAAR) 25 MG tablet TAKE 1 TABLET(25 MG) BY MOUTH DAILY  . lovastatin (MEVACOR) 40 MG tablet TAKE 1 AND 1/2 TABLETS BY MOUTH WITH DINNER  . Multiple Vitamin (MULTIVITAMIN) tablet Take 1 tablet by mouth daily.  . Omega-3 Fatty Acids (FISH OIL) 1000 MG CAPS Take 300 mg by mouth.   . TURMERIC PO Take by mouth.   No facility-administered encounter medications on file as of 04/12/2019.     Activities of Daily Living In your present state of health, do you have any difficulty performing the following activities: 04/12/2019  Hearing? Y  Vision? N  Difficulty concentrating or making decisions? Y  Comment comes back to her   Walking or climbing stairs? Y  Comment due to [employer] or bathing? N  Comment has someone close by just in case.   Doing errands, shopping? N  Preparing Food and eating ? N  Using the Toilet? N  In the past six months, have you accidently leaked urine? N  Do you have problems with loss of bowel control? N  Managing your Medications? N  Managing your Finances? N  Housekeeping or managing your Housekeeping? N  Some recent data might be hidden    Patient Care Team: [name], DO as PCP - General (Family Medicine) Greg Cutter, LCSW as Social Worker (Licensed Clinical Social Worker) Melrose Nakayama, Doneta Public, MD as Referring Physician (Neurology)    Assessment:   This is a routine wellness examination for [place].  Exercise Activities and Dietary recommendations Current Exercise Habits: Home exercise routine, Time (Minutes): 25, Frequency (Times/Week): 7, Weekly Exercise (Minutes/Week): 175, Intensity: Mild, Exercise limited by: None  identified  Goals    . "I want to find a counselor" (pt-stated)     Current Barriers:  [name] Mental Health Concerns  . Cognitive Deficits . Lacks knowledge of community resource: available mental health support resources that accept her insurance  Clinical Social Work Clinical Goal(s):  [name] Over the next 90 days, client will work with SW to address concerns related to depression . Over the next 90 days, client will follow up with provided mental health resources that were emailed to patient  Interventions: . Patient interviewed and appropriate assessments performed . Provided mental health counseling with regard to depression  . Provided patient with information  about mental health support resources that accept her insurance. Patient reports that she is still having issues with finding a counselor that accepts her insurance. LCSW advised patient that her primary care insurance is not [facility] but Medicaid and that she has several counseling options that accept her insurance. . Discussed plans with patient for ongoing care management follow up and provided patient with direct contact information for care management team . Advised patient to investigate mental health resources that LCSW sent out. Education provided on these resources again as well. [name] LCSW provided positive reinforcement for patient's recent implementation of healthy coping tools to combat depressive symptoms . Assisted patient/caregiver with obtaining information about health plan benefits  Patient Self Care Activities:  . Currently UNABLE TO independently find a counselor that accepts her insurance . Attends all scheduled provider appointments . Calls provider office for new concerns or questions  Please see past updates related to this goal by clicking on the "Past Updates" button in the selected goal         Fall Risk: Fall Risk  04/12/2019 12/19/2018 01/27/2018 10/27/2017 10/21/2016  Falls in the past year? 1 1 Yes Yes Yes   Number falls in past yr: 1 1 1 1 1   Injury with Fall? 0 1 No No No  Risk for fall due to : Impaired balance/gait Impaired balance/gait;Impaired mobility - - Impaired balance/gait;Impaired mobility  Follow up - Falls prevention discussed - Falls prevention discussed Falls evaluation completed    FALL RISK PREVENTION PERTAINING TO THE HOME:  Any stairs in or around the home? No  If so, are there any without handrails? n/a  Home free of loose throw rugs in walkways, pet beds, electrical cords, etc? Yes  Adequate lighting in your home to reduce risk of falls? Yes   ASSISTIVE DEVICES UTILIZED TO PREVENT FALLS:  Life alert? Yes  Use of a cane, walker or w/c? Yes  Grab bars in the bathroom? Yes  Shower chair or bench in shower? Yes  Elevated toilet seat or a handicapped toilet? No   DME ORDERS:  DME order needed?  No   TIMED UP AND GO:  Unable to perform  Depression Screen PHQ 2/9 Scores 04/12/2019 02/08/2019 12/19/2018 01/27/2018  PHQ - 2 Score 0 4 6 1   PHQ- 9 Score - 8 14 3      Cognitive Function     6CIT Screen 04/12/2019 10/27/2017 10/21/2016  What Year? 0 points 0 points 0 points  What month? 0 points 0 points 0 points  What time? 0 points 0 points 0 points  Count back from 20 0 points 0 points 0 points  Months in reverse 0 points 0 points 0 points  Repeat phrase 0 points 0 points 2 points  Total Score 0 0 2    Immunization History  Administered Date(s) Administered  . Influenza, High Dose Seasonal PF 09/03/2016, 09/07/2017, 08/03/2018  . Influenza,inj,Quad PF,6+ Mos 09/17/2015  . Influenza-Unspecified 08/10/2011, 07/21/2012, 07/23/2013  . Pneumococcal Conjugate-13 05/04/2016  . Pneumococcal Polysaccharide-23 10/27/2017  . Tdap 09/17/2015  . Zoster 07/06/2011    Qualifies for Shingles Vaccine? Yes  Zostavax completed 07/06/2011. Due for Shingrix. Education has been provided regarding the importance of this vaccine. Pt has been advised to call insurance company to  determine out of pocket expense. Advised may also receive vaccine at local pharmacy or Health Dept. Verbalized acceptance and understanding.  Tdap: up to date  Flu Vaccine: up to date   Pneumococcal Vaccine: up to date  Screening Tests Health Maintenance  Topic Date Due  . INFLUENZA VACCINE  06/09/2019  . MAMMOGRAM  02/15/2020  . Fecal DNA (Cologuard)  11/13/2020  . TETANUS/TDAP  09/16/2025  . DEXA SCAN  Completed  . PNA vac Low Risk Adult  Completed  . Hepatitis C Screening  Addressed  . COLON CANCER SCREENING ANNUAL FOBT  Discontinued    Cancer Screenings:  Colorectal Screening: cologuard completed 11/13/2017, due 11/2020  Mammogram: Completed 02/14/2018. Repeat every year. Postponed until September   Bone Density: Completed 02/14/2018.   Lung Cancer Screening: (Low Dose CT Chest recommended if Age 45-80 years, 30 pack-year currently smoking OR have quit w/in 15years.) does not qualify.    Additional Screening:   Hepatitis C Screening: does qualify; Completed 11/08/2010  Vision Screening: Recommended annual ophthalmology exams for early detection of glaucoma and other disorders of the eye. Is the patient up to date with their annual eye exam?  Yes  Who is the provider or what is the name of the office in which the pt attends annual eye exams?    Dental Screening: Recommended annual dental exams for proper oral hygiene  Community Resource Referral:  CRR required this visit?  No       Plan:  I have personally reviewed and addressed the Medicare Annual Wellness questionnaire and have noted the following in the patient's chart:  A. Medical and social history B. Use of alcohol, tobacco or illicit drugs  C. Current medications and supplements D. Functional ability and status E.  Nutritional status F.  Physical activity G. Advance directives H. List of other physicians I.  Hospitalizations, surgeries, and ER visits in previous 12 months J.  [place] such as  hearing and vision if needed, cognitive and depression L. Referrals and appointments   In addition, I have reviewed and discussed with patient certain preventive protocols, quality metrics, and best practice recommendations. A written personalized care plan for preventive services as well as general preventive health recommendations were provided to patient.   Signed,    Bevelyn Ngo, LPN  01/09/3544 Nurse Health Advisor   Nurse Notes: Patient stated she has ahd some low BP's in the morning. She states she hasnt been tracking them but has had a few low ones. Informed patient to write down her BP readings for the next week and I will call her in a week to go over them and see if she needs to schedule an appt with Dr.Johnson. she also follows up with Dr.potter on the 15th so I informed her to take her readings to that appt as well.

## 2019-04-19 ENCOUNTER — Telehealth: Payer: Self-pay

## 2019-04-19 NOTE — Telephone Encounter (Signed)
Called patient back and informed her to stop taking her HCTZ and to cut her losartan in half. Patient verbalized understanding and states she will begin this regimen starting tomorrow. She denies any questions or concerns at this time. She will continue to log her BPs daily until her appt in 2 weeks. Scheduled virtual visit on 6/23.  Pt confirmed

## 2019-04-19 NOTE — Telephone Encounter (Signed)
Please have her stop her HCTZ and cut her losartan in 1/2 and we'll recheck in 2 weeks. (video or in office their choice.)

## 2019-04-19 NOTE — Telephone Encounter (Signed)
AWV completed last week 04/12/2019- patient mentioned her BPs running low in the morning. Advised patient to record her BPs for the next week, this is a follow up call to check this pasts weeks BP recordings.  Spoke with patient and received the follow BP recordings via phone call:  6/5  AM 80/58 HR 78  PM 98/63 HR 84 6/6  AM 84/57 HR 85 PM 127/84 HR 81 6/7  AM 79/49 HR 87 PM 96/62 HR 85 6/8 AM 89/92 HR 93 PM 121/82 HR 83 6/9 AM 85/55 HR 89 PM 102/65 HR 96 6/10 AM 94/56 HR 89 PM 129/72 HR 83 6/11 AM 71/47 HR 106   Will fwd to Dr.Johnson for review.

## 2019-04-23 DIAGNOSIS — R200 Anesthesia of skin: Secondary | ICD-10-CM | POA: Diagnosis not present

## 2019-04-23 DIAGNOSIS — G20 Parkinson's disease: Secondary | ICD-10-CM | POA: Diagnosis not present

## 2019-04-25 ENCOUNTER — Telehealth: Payer: Self-pay | Admitting: Family Medicine

## 2019-04-25 DIAGNOSIS — G20 Parkinson's disease: Secondary | ICD-10-CM | POA: Diagnosis not present

## 2019-04-25 DIAGNOSIS — R413 Other amnesia: Secondary | ICD-10-CM | POA: Diagnosis not present

## 2019-04-25 DIAGNOSIS — Z6823 Body mass index (BMI) 23.0-23.9, adult: Secondary | ICD-10-CM | POA: Diagnosis not present

## 2019-04-25 DIAGNOSIS — Z9181 History of falling: Secondary | ICD-10-CM | POA: Diagnosis not present

## 2019-04-25 NOTE — Telephone Encounter (Signed)
Elizabeth with [facility] called to confirm that the patient's medications have been changed.  The patient stated that she has been taken off of some meds and some dosages have been changed.  PT would like this confirmed.  CB# [phone]

## 2019-04-26 ENCOUNTER — Telehealth: Payer: Self-pay | Admitting: Family Medicine

## 2019-04-26 NOTE — Telephone Encounter (Signed)
Caller/Agency: Kathlee Nations with [facility] Number: [phone] Requesting OT/PT/Skilled Nursing/Social Work/Speech Therapy: speech therapy for cognition and memory Frequency: evaluation 1 time a week for 1 week starting week of 04/30/19

## 2019-04-26 NOTE — Telephone Encounter (Signed)
Verbal orders given  

## 2019-04-26 NOTE — Telephone Encounter (Signed)
Please go over meds with PT

## 2019-04-26 NOTE — Telephone Encounter (Signed)
OK for verbal orders?

## 2019-04-26 NOTE — Telephone Encounter (Signed)
Medication list and changes relayed to PT. Verbalized understanding and denied questions.

## 2019-04-27 ENCOUNTER — Other Ambulatory Visit: Payer: Self-pay

## 2019-04-27 ENCOUNTER — Ambulatory Visit (INDEPENDENT_AMBULATORY_CARE_PROVIDER_SITE_OTHER): Payer: Medicare Other | Admitting: Family Medicine

## 2019-04-27 ENCOUNTER — Encounter: Payer: Self-pay | Admitting: Family Medicine

## 2019-04-27 VITALS — BP 138/86 | HR 87 | Temp 99.1°F

## 2019-04-27 DIAGNOSIS — I10 Essential (primary) hypertension: Secondary | ICD-10-CM | POA: Diagnosis not present

## 2019-04-27 DIAGNOSIS — H6123 Impacted cerumen, bilateral: Secondary | ICD-10-CM

## 2019-04-27 MED ORDER — LOSARTAN POTASSIUM 25 MG PO TABS: 12.5000 mg | ORAL_TABLET | Freq: Every day | ORAL | 1 refills | Status: DC

## 2019-04-27 NOTE — Assessment & Plan Note (Signed)
Was getting hypotensive. Stopped HCTZ and cut losartan in 1/2. Will continue current regimen and continue to monitor. Labs drawn today.

## 2019-04-27 NOTE — Progress Notes (Signed)
BP 138/86 (BP Location: Left Arm, Patient Position: Sitting, Cuff Size: Normal)   Pulse 87   Temp 99.1 F (37.3 C) (Oral)   SpO2 99%    Subjective:    Patient ID: Melissa Wise, female    DOB: 06-24-1951, 68 y.o.   MRN: [id number]  HPI: Melissa Wise is a 68 y.o. female  Chief Complaint  Patient presents with  . Ear Problem    Patient having severe trouble hearing.   [name] Hypertension    Follow-up   HYPERTENSION- was having low blood pressure readings at home down to 71/47 with dizziness Hypertension status: better  Satisfied with current treatment? no Duration of hypertension: chronic BP monitoring frequency:  a few times a day BP range: low in the AM (10G systolic), running higher in the PM BP medication side effects:  no Medication compliance: excellent compliance Previous BP meds: losartan, HCTZ Aspirin: no Recurrent headaches: no Visual changes: no Palpitations: no Dyspnea: no Chest pain: no Lower extremity edema: no Dizzy/lightheaded: no  EAG CLOGGED Duration: few days Involved ear(s):  bilateral Sensation of feeling clogged/plugged: yes Decreased/muffled hearing:yes Ear pain: no Fever: no Otorrhea: no Hearing loss: yes Upper respiratory infection symptoms: no Using Q-Tips: no Status: worse History of cerumenosis: yes Treatments attempted: none  Relevant past medical, surgical, family and social history reviewed and updated as indicated. Interim medical history since our last visit reviewed. Allergies and medications reviewed and updated.  Review of Systems  Constitutional: Negative.   HENT: Positive for hearing loss. Negative for congestion, dental problem, drooling, ear discharge, ear pain, facial swelling, mouth sores, nosebleeds, postnasal drip, rhinorrhea, sinus pressure, sinus pain, sneezing, sore throat, tinnitus and trouble swallowing.   Eyes: Negative.   Respiratory: Negative.   Cardiovascular: Negative.   Psychiatric/Behavioral:  Negative.     Per HPI unless specifically indicated above     Objective:    BP 138/86 (BP Location: Left Arm, Patient Position: Sitting, Cuff Size: Normal)   Pulse 87   Temp 99.1 F (37.3 C) (Oral)   SpO2 99%   Wt Readings from Last 3 Encounters:  04/12/19 155 lb (70.3 kg)  02/08/19 155 lb (70.3 kg)  01/21/19 152 lb (68.9 kg)    Physical Exam Vitals signs and nursing note reviewed.  Constitutional:      General: She is not in acute distress.    Appearance: Normal appearance. She is not ill-appearing, toxic-appearing or diaphoretic.  HENT:     Head: Normocephalic and atraumatic.     Right Ear: External ear normal. There is impacted cerumen.     Left Ear: External ear normal. There is impacted cerumen.     Nose: Nose normal.     Mouth/Throat:     Mouth: Mucous membranes are moist.     Pharynx: Oropharynx is clear.  Eyes:     General: No scleral icterus.       Right eye: No discharge.        Left eye: No discharge.     Extraocular Movements: Extraocular movements intact.     Conjunctiva/sclera: Conjunctivae normal.     Pupils: Pupils are equal, round, and reactive to light.  Neck:     Musculoskeletal: Normal range of motion and neck supple.  Cardiovascular:     Rate and Rhythm: Normal rate and regular rhythm.     Pulses: Normal pulses.     Heart sounds: Normal heart sounds. No murmur. No friction rub. No gallop.   Pulmonary:  Effort: Pulmonary effort is normal. No respiratory distress.     Breath sounds: Normal breath sounds. No stridor. No wheezing, rhonchi or rales.  Chest:     Chest wall: No tenderness.  Musculoskeletal: Normal range of motion.  Skin:    General: Skin is warm and dry.     Capillary Refill: Capillary refill takes less than 2 seconds.     Coloration: Skin is not jaundiced or pale.     Findings: No bruising, erythema, lesion or rash.  Neurological:     General: No focal deficit present.     Mental Status: She is alert and oriented to person,  place, and time. Mental status is at baseline.  Psychiatric:        Mood and Affect: Mood normal.        Behavior: Behavior normal.        Thought Content: Thought content normal.        Judgment: Judgment normal.     Results for orders placed or performed in visit on 02/15/19  Microscopic Examination   URINE  Result Value Ref Range   WBC, UA None seen 0 - 5 /hpf   RBC 0-2 0 - 2 /hpf   Epithelial Cells (non renal) 0-10 0 - 10 /hpf   Bacteria, UA None seen None seen/Few  UA/M w/rflx Culture, Routine   Specimen: Urine   URINE  Result Value Ref Range   Specific Gravity, UA 1.010 1.005 - 1.030   pH, UA 6.0 5.0 - 7.5   Color, UA Yellow Yellow   Appearance Ur Clear Clear   Leukocytes,UA Negative Negative   Protein,UA Negative Negative/Trace   Glucose, UA Negative Negative   Ketones, UA Negative Negative   RBC, UA 1+ (A) Negative   Bilirubin, UA Negative Negative   Urobilinogen, Ur 0.2 0.2 - 1.0 mg/dL   Nitrite, UA Negative Negative   Microscopic Examination See below:   Lipid Panel w/o Chol/HDL Ratio  Result Value Ref Range   Cholesterol, Total 150 100 - 199 mg/dL   Triglycerides 107 0 - 149 mg/dL   HDL 67 >39 mg/dL   VLDL Cholesterol Cal 21 5 - 40 mg/dL   LDL Calculated 62 0 - 99 mg/dL  Comprehensive metabolic panel  Result Value Ref Range   Glucose 101 (H) 65 - 99 mg/dL   BUN 16 8 - 27 mg/dL   Creatinine, Ser 0.72 0.57 - 1.00 mg/dL   GFR calc non Af Amer 87 >59 mL/min/1.73   GFR calc Af Amer 100 >59 mL/min/1.73   BUN/Creatinine Ratio 22 12 - 28   Sodium 135 134 - 144 mmol/L   Potassium 4.0 3.5 - 5.2 mmol/L   Chloride 91 (L) 96 - 106 mmol/L   CO2 28 20 - 29 mmol/L   Calcium 9.5 8.7 - 10.3 mg/dL   Total Protein 7.0 6.0 - 8.5 g/dL   Albumin 4.7 3.8 - 4.8 g/dL   Globulin, Total 2.3 1.5 - 4.5 g/dL   Albumin/Globulin Ratio 2.0 1.2 - 2.2   Bilirubin Total 0.3 0.0 - 1.2 mg/dL   Alkaline Phosphatase 70 39 - 117 IU/L   AST 17 0 - 40 IU/L   ALT 8 0 - 32 IU/L       Assessment & Plan:   Problem List Items Addressed This Visit      Cardiovascular and Mediastinum   Hypertension - Primary    Was getting hypotensive. Stopped HCTZ and cut losartan in 1/2. Will continue current  regimen and continue to monitor. Labs drawn today.      Relevant Medications   losartan (COZAAR) 25 MG tablet   Other Relevant Orders   Basic metabolic panel    Other Visit Diagnoses    Bilateral impacted cerumen       Ears flushed today with good results.        Follow up plan: Return As scheduled.

## 2019-05-01 ENCOUNTER — Ambulatory Visit: Payer: Medicaid Other | Admitting: Family Medicine

## 2019-05-02 DIAGNOSIS — Z9181 History of falling: Secondary | ICD-10-CM | POA: Diagnosis not present

## 2019-05-02 DIAGNOSIS — G20 Parkinson's disease: Secondary | ICD-10-CM | POA: Diagnosis not present

## 2019-05-02 DIAGNOSIS — R413 Other amnesia: Secondary | ICD-10-CM | POA: Diagnosis not present

## 2019-05-02 DIAGNOSIS — Z6823 Body mass index (BMI) 23.0-23.9, adult: Secondary | ICD-10-CM | POA: Diagnosis not present

## 2019-05-04 DIAGNOSIS — Z9181 History of falling: Secondary | ICD-10-CM | POA: Diagnosis not present

## 2019-05-04 DIAGNOSIS — R413 Other amnesia: Secondary | ICD-10-CM | POA: Diagnosis not present

## 2019-05-04 DIAGNOSIS — Z6823 Body mass index (BMI) 23.0-23.9, adult: Secondary | ICD-10-CM | POA: Diagnosis not present

## 2019-05-04 DIAGNOSIS — G20 Parkinson's disease: Secondary | ICD-10-CM | POA: Diagnosis not present

## 2019-05-07 ENCOUNTER — Ambulatory Visit: Payer: Medicare Other | Admitting: Licensed Clinical Social Worker

## 2019-05-07 ENCOUNTER — Other Ambulatory Visit: Payer: Self-pay

## 2019-05-07 DIAGNOSIS — G20 Parkinson's disease: Secondary | ICD-10-CM | POA: Diagnosis not present

## 2019-05-07 DIAGNOSIS — Z6823 Body mass index (BMI) 23.0-23.9, adult: Secondary | ICD-10-CM | POA: Diagnosis not present

## 2019-05-07 DIAGNOSIS — R413 Other amnesia: Secondary | ICD-10-CM | POA: Diagnosis not present

## 2019-05-07 DIAGNOSIS — Z9181 History of falling: Secondary | ICD-10-CM | POA: Diagnosis not present

## 2019-05-07 DIAGNOSIS — F339 Major depressive disorder, recurrent, unspecified: Secondary | ICD-10-CM

## 2019-05-07 NOTE — Chronic Care Management (AMB) (Signed)
  Chronic Care Management    Clinical Social Work Follow Up Note  05/07/2019 Name: Melissa Wise MRN: [id number] DOB: 06-Oct-1951  Melissa Wise is a 68 y.o. year old female who is a primary care patient of [name], DO. The CCM team was consulted for assistance with Mental Health Counseling and Resources.   Review of patient status, including review of consultants reports, other relevant assessments, and collaboration with appropriate care team members and the patient's provider was performed as part of comprehensive patient evaluation and provision of chronic care management services.     Goals Addressed    . "I want to find a counselor" (pt-stated)       Current Barriers:  [name] Mental Health Concerns  . Cognitive Deficits . Lacks knowledge of community resource: available mental health support resources that accept her insurance  Clinical Social Work Clinical Goal(s):  [name] Over the next 90 days, client will work with SW to address concerns related to depression . Over the next 90 days, client will follow up with provided mental health resources that were emailed to patient  Interventions: . Patient interviewed and appropriate assessments performed . Provided mental health counseling with regard to depression  . Patient informed LCSW that [facility] is involved in her care and that she has an in home assessment appointment this week with [facility] social worker. . Provided patient with information about mental health support resources that accept her insurance. Patient reports that she is still having issues with finding a counselor that accepts her insurance. LCSW advised patient that her primary care insurance is not [facility] but Medicaid and that she has several counseling options that accept her insurance. Patient denies utilizing or investigating resources that LCSW sent out yet. . Discussed plans with patient for ongoing care management follow up and provided patient with direct  contact information for care management team . Advised patient to investigate mental health resources that LCSW sent. . Education provided on these resources again as well. [name] LCSW provided positive reinforcement for patient's recent implementation of healthy coping tools to combat depressive symptoms . Assisted patient/caregiver with obtaining information about health plan benefits . Educated patient on coping methods to implement into her daily life to combat depressive symptoms and stress. Patient denied any suicidal or homicidal ideations. Encouraged patient to consider utilizing mental health resources that were provided in order to find a counselor.   Patient Self Care Activities:  . Currently UNABLE TO independently find a counselor that accepts her insurance . Attends all scheduled provider appointments . Calls provider office for new concerns or questions  Please see past updates related to this goal by clicking on the "Past Updates" button in the selected goal    Follow Up Plan: SW will follow up with patient by phone over the next 30 days  [name], [place], MSW, [email or web address] Phone: [phone]

## 2019-05-10 DIAGNOSIS — Z9181 History of falling: Secondary | ICD-10-CM | POA: Diagnosis not present

## 2019-05-10 DIAGNOSIS — G20 Parkinson's disease: Secondary | ICD-10-CM | POA: Diagnosis not present

## 2019-05-10 DIAGNOSIS — Z6823 Body mass index (BMI) 23.0-23.9, adult: Secondary | ICD-10-CM | POA: Diagnosis not present

## 2019-05-11 DIAGNOSIS — Z9181 History of falling: Secondary | ICD-10-CM | POA: Diagnosis not present

## 2019-05-11 DIAGNOSIS — G20 Parkinson's disease: Secondary | ICD-10-CM | POA: Diagnosis not present

## 2019-05-11 DIAGNOSIS — Z6823 Body mass index (BMI) 23.0-23.9, adult: Secondary | ICD-10-CM | POA: Diagnosis not present

## 2019-05-19 DIAGNOSIS — Z6823 Body mass index (BMI) 23.0-23.9, adult: Secondary | ICD-10-CM | POA: Diagnosis not present

## 2019-05-19 DIAGNOSIS — G20 Parkinson's disease: Secondary | ICD-10-CM | POA: Diagnosis not present

## 2019-05-19 DIAGNOSIS — Z9181 History of falling: Secondary | ICD-10-CM | POA: Diagnosis not present

## 2019-05-21 DIAGNOSIS — Z6823 Body mass index (BMI) 23.0-23.9, adult: Secondary | ICD-10-CM | POA: Diagnosis not present

## 2019-05-21 DIAGNOSIS — Z9181 History of falling: Secondary | ICD-10-CM | POA: Diagnosis not present

## 2019-05-21 DIAGNOSIS — G20 Parkinson's disease: Secondary | ICD-10-CM | POA: Diagnosis not present

## 2019-05-23 DIAGNOSIS — G20 Parkinson's disease: Secondary | ICD-10-CM | POA: Diagnosis not present

## 2019-05-23 DIAGNOSIS — Z6823 Body mass index (BMI) 23.0-23.9, adult: Secondary | ICD-10-CM | POA: Diagnosis not present

## 2019-05-23 DIAGNOSIS — Z9181 History of falling: Secondary | ICD-10-CM | POA: Diagnosis not present

## 2019-05-24 DIAGNOSIS — Z6823 Body mass index (BMI) 23.0-23.9, adult: Secondary | ICD-10-CM | POA: Diagnosis not present

## 2019-05-24 DIAGNOSIS — G20 Parkinson's disease: Secondary | ICD-10-CM | POA: Diagnosis not present

## 2019-05-24 DIAGNOSIS — Z9181 History of falling: Secondary | ICD-10-CM | POA: Diagnosis not present

## 2019-05-28 DIAGNOSIS — Z6823 Body mass index (BMI) 23.0-23.9, adult: Secondary | ICD-10-CM | POA: Diagnosis not present

## 2019-05-28 DIAGNOSIS — G20 Parkinson's disease: Secondary | ICD-10-CM | POA: Diagnosis not present

## 2019-05-28 DIAGNOSIS — Z9181 History of falling: Secondary | ICD-10-CM | POA: Diagnosis not present

## 2019-05-28 DIAGNOSIS — R200 Anesthesia of skin: Secondary | ICD-10-CM | POA: Diagnosis not present

## 2019-05-30 ENCOUNTER — Telehealth: Payer: Medicare Other

## 2019-06-02 DIAGNOSIS — Z9181 History of falling: Secondary | ICD-10-CM | POA: Diagnosis not present

## 2019-06-02 DIAGNOSIS — Z6823 Body mass index (BMI) 23.0-23.9, adult: Secondary | ICD-10-CM | POA: Diagnosis not present

## 2019-06-02 DIAGNOSIS — G20 Parkinson's disease: Secondary | ICD-10-CM | POA: Diagnosis not present

## 2019-06-04 DIAGNOSIS — G20 Parkinson's disease: Secondary | ICD-10-CM | POA: Diagnosis not present

## 2019-06-04 DIAGNOSIS — Z6823 Body mass index (BMI) 23.0-23.9, adult: Secondary | ICD-10-CM | POA: Diagnosis not present

## 2019-06-04 DIAGNOSIS — Z9181 History of falling: Secondary | ICD-10-CM | POA: Diagnosis not present

## 2019-06-05 DIAGNOSIS — Z9181 History of falling: Secondary | ICD-10-CM | POA: Diagnosis not present

## 2019-06-05 DIAGNOSIS — Z6823 Body mass index (BMI) 23.0-23.9, adult: Secondary | ICD-10-CM | POA: Diagnosis not present

## 2019-06-05 DIAGNOSIS — G20 Parkinson's disease: Secondary | ICD-10-CM | POA: Diagnosis not present

## 2019-06-11 DIAGNOSIS — Z9181 History of falling: Secondary | ICD-10-CM | POA: Diagnosis not present

## 2019-06-11 DIAGNOSIS — Z6823 Body mass index (BMI) 23.0-23.9, adult: Secondary | ICD-10-CM | POA: Diagnosis not present

## 2019-06-11 DIAGNOSIS — G20 Parkinson's disease: Secondary | ICD-10-CM | POA: Diagnosis not present

## 2019-06-13 DIAGNOSIS — G20 Parkinson's disease: Secondary | ICD-10-CM | POA: Diagnosis not present

## 2019-06-13 DIAGNOSIS — Z6823 Body mass index (BMI) 23.0-23.9, adult: Secondary | ICD-10-CM | POA: Diagnosis not present

## 2019-06-13 DIAGNOSIS — Z9181 History of falling: Secondary | ICD-10-CM | POA: Diagnosis not present

## 2019-06-20 DIAGNOSIS — G20 Parkinson's disease: Secondary | ICD-10-CM | POA: Diagnosis not present

## 2019-06-20 DIAGNOSIS — Z9181 History of falling: Secondary | ICD-10-CM | POA: Diagnosis not present

## 2019-06-20 DIAGNOSIS — Z6823 Body mass index (BMI) 23.0-23.9, adult: Secondary | ICD-10-CM | POA: Diagnosis not present

## 2019-06-26 DIAGNOSIS — Z6823 Body mass index (BMI) 23.0-23.9, adult: Secondary | ICD-10-CM | POA: Diagnosis not present

## 2019-06-26 DIAGNOSIS — I69311 Memory deficit following cerebral infarction: Secondary | ICD-10-CM | POA: Diagnosis not present

## 2019-06-26 DIAGNOSIS — Z9181 History of falling: Secondary | ICD-10-CM | POA: Diagnosis not present

## 2019-06-26 DIAGNOSIS — G20 Parkinson's disease: Secondary | ICD-10-CM | POA: Diagnosis not present

## 2019-06-27 DIAGNOSIS — Z6823 Body mass index (BMI) 23.0-23.9, adult: Secondary | ICD-10-CM | POA: Diagnosis not present

## 2019-06-27 DIAGNOSIS — G20 Parkinson's disease: Secondary | ICD-10-CM | POA: Diagnosis not present

## 2019-06-27 DIAGNOSIS — Z9181 History of falling: Secondary | ICD-10-CM | POA: Diagnosis not present

## 2019-06-27 DIAGNOSIS — I69311 Memory deficit following cerebral infarction: Secondary | ICD-10-CM | POA: Diagnosis not present

## 2019-06-28 DIAGNOSIS — R200 Anesthesia of skin: Secondary | ICD-10-CM | POA: Diagnosis not present

## 2019-06-28 DIAGNOSIS — G20 Parkinson's disease: Secondary | ICD-10-CM | POA: Diagnosis not present

## 2019-06-29 ENCOUNTER — Other Ambulatory Visit: Payer: Self-pay

## 2019-06-29 ENCOUNTER — Ambulatory Visit: Payer: Medicare Other | Admitting: Licensed Clinical Social Worker

## 2019-06-29 DIAGNOSIS — F339 Major depressive disorder, recurrent, unspecified: Secondary | ICD-10-CM

## 2019-06-29 NOTE — Chronic Care Management (AMB) (Signed)
Chronic Care Management    Clinical Social Work Follow Up Note  06/29/2019 Name: Melissa Wise MRN: [id number] DOB: [birth year]  Melissa Wise is a 68 y.o. year old female who is a primary care patient of [name], DO. The CCM team was consulted for assistance with Mental Health Counseling and Resources.   Review of patient status, including review of consultants reports, other relevant assessments, and collaboration with appropriate care team members and the patient's provider was performed as part of comprehensive patient evaluation and provision of chronic care management services.    SDOH (Social Determinants of Health) screening performed today: Depression  . See Care Plan for related entries.   Outpatient Encounter Medications as of 06/29/2019  Medication Sig Note  . carbidopa-levodopa (SINEMET IR) 25-100 MG tablet  10/21/2016: Received from: External Pharmacy  . COCONUT OIL PO Take by mouth.   . FIBER ADULT GUMMIES PO Take by mouth.   . losartan (COZAAR) 25 MG tablet Take 0.5 tablets (12.5 mg total) by mouth daily.   [name] lovastatin (MEVACOR) 40 MG tablet TAKE 1 AND 1/2 TABLETS BY MOUTH WITH DINNER   . Multiple Vitamin (MULTIVITAMIN) tablet Take 1 tablet by mouth daily.   . Omega-3 Fatty Acids (FISH OIL) 1000 MG CAPS Take 300 mg by mouth.    . TURMERIC PO Take by mouth.    No facility-administered encounter medications on file as of 06/29/2019.      Goals Addressed    . "I want to find a counselor" (pt-stated)       Current Barriers:  [name] Mental Health Concerns  . Cognitive Deficits . Lacks knowledge of community resource: available mental health support resources that accept her insurance  Clinical Social Work Clinical Goal(s):  [name] Over the next 90 days, client will work with SW to address concerns related to depression . Over the next 90 days, client will follow up with provided mental health resources that were emailed to patient  Interventions: . Patient  interviewed and appropriate assessments performed . Provided mental health counseling with regard to ongoing depression. Patient reports actively implementing appropriate self-care into her routine. Patient shares that she is "staying positive." . Patient informed LCSW that [facility] is still involved in her care and that she is actively working with [facility] social worker. . Provided patient with information about mental health support resources that accept her insurance. Patient reports that she is still having issues with finding a counselor that accepts her insurance. LCSW advised patient that she has both [facility] AND Medicaid and she has several options that accept both of her insurances. Patient denied utilizing or investigating resources that LCSW sent out yet. Resource education provided again.  . Discussed plans with patient for ongoing care management follow up and provided patient with direct contact information for care management team . Advised patient to investigate mental health resources that LCSW sent. . Education provided on these resources again as well. [name] LCSW provided positive reinforcement for patient's recent implementation of healthy coping tools to combat depressive symptoms . Assisted patient/caregiver with obtaining information about health plan benefits . Educated patient on coping methods to implement into her daily life to combat depressive symptoms and stress. Patient denied any suicidal or homicidal ideations. Encouraged patient to consider utilizing mental health resources that were provided in order to find a counselor.   Patient Self Care Activities:  . Currently UNABLE TO independently find a counselor that accepts her insurance . Attends all scheduled provider appointments .  Calls provider office for new concerns or questions  Please see past updates related to this goal by clicking on the "Past Updates" button in the selected goal      Follow Up Plan: SW  will follow up with patient by phone over the next 60 days  [name], [place], MSW, [email or web address] Phone: [phone]

## 2019-07-04 DIAGNOSIS — G20 Parkinson's disease: Secondary | ICD-10-CM | POA: Diagnosis not present

## 2019-07-04 DIAGNOSIS — I69311 Memory deficit following cerebral infarction: Secondary | ICD-10-CM | POA: Diagnosis not present

## 2019-07-04 DIAGNOSIS — Z6823 Body mass index (BMI) 23.0-23.9, adult: Secondary | ICD-10-CM | POA: Diagnosis not present

## 2019-07-04 DIAGNOSIS — Z9181 History of falling: Secondary | ICD-10-CM | POA: Diagnosis not present

## 2019-07-06 DIAGNOSIS — I69311 Memory deficit following cerebral infarction: Secondary | ICD-10-CM | POA: Diagnosis not present

## 2019-07-06 DIAGNOSIS — Z6823 Body mass index (BMI) 23.0-23.9, adult: Secondary | ICD-10-CM | POA: Diagnosis not present

## 2019-07-06 DIAGNOSIS — G20 Parkinson's disease: Secondary | ICD-10-CM | POA: Diagnosis not present

## 2019-07-06 DIAGNOSIS — Z9181 History of falling: Secondary | ICD-10-CM | POA: Diagnosis not present

## 2019-07-09 DIAGNOSIS — I69311 Memory deficit following cerebral infarction: Secondary | ICD-10-CM | POA: Diagnosis not present

## 2019-07-09 DIAGNOSIS — G20 Parkinson's disease: Secondary | ICD-10-CM | POA: Diagnosis not present

## 2019-07-09 DIAGNOSIS — Z6823 Body mass index (BMI) 23.0-23.9, adult: Secondary | ICD-10-CM | POA: Diagnosis not present

## 2019-07-09 DIAGNOSIS — Z9181 History of falling: Secondary | ICD-10-CM | POA: Diagnosis not present

## 2019-07-12 DIAGNOSIS — I69311 Memory deficit following cerebral infarction: Secondary | ICD-10-CM | POA: Diagnosis not present

## 2019-07-12 DIAGNOSIS — Z9181 History of falling: Secondary | ICD-10-CM | POA: Diagnosis not present

## 2019-07-12 DIAGNOSIS — Z6823 Body mass index (BMI) 23.0-23.9, adult: Secondary | ICD-10-CM | POA: Diagnosis not present

## 2019-07-12 DIAGNOSIS — G20 Parkinson's disease: Secondary | ICD-10-CM | POA: Diagnosis not present

## 2019-07-17 DIAGNOSIS — G20 Parkinson's disease: Secondary | ICD-10-CM | POA: Diagnosis not present

## 2019-07-17 DIAGNOSIS — Z9181 History of falling: Secondary | ICD-10-CM | POA: Diagnosis not present

## 2019-07-17 DIAGNOSIS — I69311 Memory deficit following cerebral infarction: Secondary | ICD-10-CM | POA: Diagnosis not present

## 2019-07-17 DIAGNOSIS — Z6823 Body mass index (BMI) 23.0-23.9, adult: Secondary | ICD-10-CM | POA: Diagnosis not present

## 2019-07-19 DIAGNOSIS — G20 Parkinson's disease: Secondary | ICD-10-CM | POA: Diagnosis not present

## 2019-07-19 DIAGNOSIS — I69311 Memory deficit following cerebral infarction: Secondary | ICD-10-CM | POA: Diagnosis not present

## 2019-07-19 DIAGNOSIS — Z6823 Body mass index (BMI) 23.0-23.9, adult: Secondary | ICD-10-CM | POA: Diagnosis not present

## 2019-07-19 DIAGNOSIS — Z9181 History of falling: Secondary | ICD-10-CM | POA: Diagnosis not present

## 2019-07-21 ENCOUNTER — Other Ambulatory Visit: Payer: Self-pay | Admitting: Family Medicine

## 2019-07-27 ENCOUNTER — Ambulatory Visit (INDEPENDENT_AMBULATORY_CARE_PROVIDER_SITE_OTHER): Payer: Medicare Other | Admitting: Family Medicine

## 2019-07-27 ENCOUNTER — Encounter: Payer: Self-pay | Admitting: Family Medicine

## 2019-07-27 ENCOUNTER — Other Ambulatory Visit: Payer: Self-pay

## 2019-07-27 VITALS — BP 185/93 | HR 87 | Temp 99.1°F

## 2019-07-27 DIAGNOSIS — E782 Mixed hyperlipidemia: Secondary | ICD-10-CM

## 2019-07-27 DIAGNOSIS — F339 Major depressive disorder, recurrent, unspecified: Secondary | ICD-10-CM

## 2019-07-27 DIAGNOSIS — R202 Paresthesia of skin: Secondary | ICD-10-CM

## 2019-07-27 DIAGNOSIS — I10 Essential (primary) hypertension: Secondary | ICD-10-CM

## 2019-07-27 DIAGNOSIS — Z23 Encounter for immunization: Secondary | ICD-10-CM | POA: Diagnosis not present

## 2019-07-27 DIAGNOSIS — Z1239 Encounter for other screening for malignant neoplasm of breast: Secondary | ICD-10-CM

## 2019-07-27 DIAGNOSIS — Z0000 Encounter for general adult medical examination without abnormal findings: Secondary | ICD-10-CM | POA: Diagnosis not present

## 2019-07-27 DIAGNOSIS — G20 Parkinson's disease: Secondary | ICD-10-CM

## 2019-07-27 LAB — UA/M W/RFLX CULTURE, ROUTINE
Bilirubin, UA: NEGATIVE
Glucose, UA: NEGATIVE
Ketones, UA: NEGATIVE
Leukocytes,UA: NEGATIVE
Nitrite, UA: NEGATIVE
Protein,UA: NEGATIVE
Specific Gravity, UA: 1.015 (ref 1.005–1.030)
Urobilinogen, Ur: 0.2 mg/dL (ref 0.2–1.0)
pH, UA: 7 (ref 5.0–7.5)

## 2019-07-27 LAB — MICROALBUMIN, URINE WAIVED
Creatinine, Urine Waived: 10 mg/dL (ref 10–300)
Microalb, Ur Waived: 10 mg/L (ref 0–19)
Microalb/Creat Ratio: <30 mg/g (ref <30)

## 2019-07-27 LAB — MICROSCOPIC EXAMINATION
Bacteria, UA: NONE SEEN
WBC, UA: NONE SEEN /hpf (ref 0–5)

## 2019-07-27 NOTE — Assessment & Plan Note (Signed)
Doing better. Continue to monitor. Call with any concerns. Continue current regimen.

## 2019-07-27 NOTE — Assessment & Plan Note (Signed)
Continues to follow with neurology. Continue to monitor. Call with any concerns.

## 2019-07-27 NOTE — Patient Instructions (Addendum)
Call for your Mammogram:  [facility]  Address: [address], [place], [place] [address]  Phone: [phone]    Health Maintenance After Age 68 After age 13, you are at a higher risk for certain long-term diseases and infections as well as injuries from falls. Falls are a major cause of broken bones and head injuries in people who are older than age 30. Getting regular preventive care can help to keep you healthy and well. Preventive care includes getting regular testing and making lifestyle changes as recommended by your health care provider. Talk with your health care provider about:  Which screenings and tests you should have. A screening is a test that checks for a disease when you have no symptoms.  A diet and exercise plan that is right for you. What should I know about screenings and tests to prevent falls? Screening and testing are the best ways to find a health problem early. Early diagnosis and treatment give you the best chance of managing medical conditions that are common after age 12. Certain conditions and lifestyle choices may make you more likely to have a fall. Your health care provider may recommend:  Regular vision checks. Poor vision and conditions such as cataracts can make you more likely to have a fall. If you wear glasses, make sure to get your prescription updated if your vision changes.  Medicine review. Work with your health care provider to regularly review all of the medicines you are taking, including over-the-counter medicines. Ask your health care provider about any side effects that may make you more likely to have a fall. Tell your health care provider if any medicines that you take make you feel dizzy or sleepy.  Osteoporosis screening. Osteoporosis is a condition that causes the bones to get weaker. This can make the bones weak and cause them to break more easily.  Blood pressure screening. Blood pressure changes and  medicines to control blood pressure can make you feel dizzy.  Strength and balance checks. Your health care provider may recommend certain tests to check your strength and balance while standing, walking, or changing positions.  Foot health exam. Foot pain and numbness, as well as not wearing proper footwear, can make you more likely to have a fall.  Depression screening. You may be more likely to have a fall if you have a fear of falling, feel emotionally low, or feel unable to do activities that you used to do.  Alcohol use screening. Using too much alcohol can affect your balance and may make you more likely to have a fall. What actions can I take to lower my risk of falls? General instructions  Talk with your health care provider about your risks for falling. Tell your health care provider if: ? You fall. Be sure to tell your health care provider about all falls, even ones that seem minor. ? You feel dizzy, sleepy, or off-balance.  Take over-the-counter and prescription medicines only as told by your health care provider. These include any supplements.  Eat a healthy diet and maintain a healthy weight. A healthy diet includes low-fat dairy products, low-fat (lean) meats, and fiber from whole grains, beans, and lots of fruits and vegetables. Home safety  Remove any tripping hazards, such as rugs, cords, and clutter.  Install safety equipment such as grab bars in bathrooms and safety rails on stairs.  Keep rooms and walkways well-lit. Activity   Follow a regular exercise program to stay  fit. This will help you maintain your balance. Ask your health care provider what types of exercise are appropriate for you.  If you need a cane or walker, use it as recommended by your health care provider.  Wear supportive shoes that have nonskid soles. Lifestyle  Do not drink alcohol if your health care provider tells you not to drink.  If you drink alcohol, limit how much you have: ? 0-1  drink a day for women. ? 0-2 drinks a day for men.  Be aware of how much alcohol is in your drink. In the [place] one drink equals one typical bottle of beer (12 oz), one-half glass of wine (5 oz), or one shot of hard liquor (1 oz).  Do not use any products that contain nicotine or tobacco, such as cigarettes and e-cigarettes. If you need help quitting, ask your health care provider. Summary  Having a healthy lifestyle and getting preventive care can help to protect your health and wellness after age 43.  Screening and testing are the best way to find a health problem early and help you avoid having a fall. Early diagnosis and treatment give you the best chance for managing medical conditions that are more common for people who are older than age 51.  Falls are a major cause of broken bones and head injuries in people who are older than age 54. Take precautions to prevent a fall at home.  Work with your health care provider to learn what changes you can make to improve your health and wellness and to prevent falls. This information is not intended to replace advice given to you by your health care provider. Make sure you discuss any questions you have with your health care provider. Document Released: 09/07/2017 Document Revised: 02/15/2019 Document Reviewed: 09/07/2017 Elsevier Patient Education  2020 [employer].

## 2019-07-27 NOTE — Assessment & Plan Note (Signed)
Under good control on current regimen. Continue current regimen. Continue to monitor. Call with any concerns. Refills given. Labs drawn today.   

## 2019-07-27 NOTE — Assessment & Plan Note (Signed)
Not under good control. Will restart her losartan 12.5mg - taking at lunch time and follow up 1 month. Call with any concerns.

## 2019-07-27 NOTE — Progress Notes (Signed)
BP (!) 185/93   Pulse 87   Temp 99.1 F (37.3 C)   SpO2 100%    Subjective:    Patient ID: Melissa Wise, female    DOB: [birth year], 68 y.o.   MRN: [id number]  HPI: Melissa Wise is a 68 y.o. female presenting on 07/27/2019 for comprehensive medical examination. Current medical complaints include:  HYPERTENSION / HYPERLIPIDEMIA- has not been taking her losartan because she was getting hypotensive early in the AM- 70/42 when she checked it. Had been taking 1/2 tab of losartan, but stopped that several weeks ago. Was supposed to follow up on 05/30/19, but cancelled appointment.  Satisfied with current treatment? no Duration of hypertension: chronic BP monitoring frequency: a few times a day BP medication side effects: yes Past BP meds: lostartan, HCTZ, now on nothing Duration of hyperlipidemia: chronic Cholesterol medication side effects: no Cholesterol supplements: none Past cholesterol medications: lovastatin Medication compliance: fair compliance Aspirin: no Recent stressors: yes Recurrent headaches: no Visual changes: no Palpitations: no Dyspnea: no Chest pain: no Lower extremity edema: no Dizzy/lightheaded: yes  DEPRESSION Mood status: stable Satisfied with current treatment?: no Symptom severity: moderate  Duration of current treatment : past couple of weeks Side effects: no Medication compliance: excellent compliance Psychotherapy/counseling: no  Previous psychiatric medications: seroquel Depressed mood: yes Anxious mood: yes Anhedonia: no Significant weight loss or gain: no Insomnia: no  Fatigue: yes Feelings of worthlessness or guilt: no Impaired concentration/indecisiveness: no Suicidal ideations: no Hopelessness: no Crying spells: no Depression screen [facility] 2/9 07/27/2019 04/12/2019 02/08/2019 12/19/2018 01/27/2018  Decreased Interest 2 0 2 3 1   Down, Depressed, Hopeless 0 0 2 3 0  PHQ - 2 Score 2 0 4 6 1   Altered sleeping 0 - 2 3 0  Tired, decreased  energy 2 - 0 3 1  Change in appetite 0 - 0 0 0  Feeling bad or failure about yourself  0 - 0 1 1  Trouble concentrating 0 - 1 1 0  Moving slowly or fidgety/restless 0 - 1 0 0  Suicidal thoughts 0 - 0 0 0  PHQ-9 Score 4 - 8 14 3   Difficult doing work/chores Not difficult at all - Not difficult at all - -   She currently lives with: alone, sister helps Menopausal Symptoms: no  Depression Screen done today and results listed below:  Depression screen [facility] 2/9 07/27/2019 04/12/2019 02/08/2019 12/19/2018 01/27/2018  Decreased Interest 2 0 2 3 1   Down, Depressed, Hopeless 0 0 2 3 0  PHQ - 2 Score 2 0 4 6 1   Altered sleeping 0 - 2 3 0  Tired, decreased energy 2 - 0 3 1  Change in appetite 0 - 0 0 0  Feeling bad or failure about yourself  0 - 0 1 1  Trouble concentrating 0 - 1 1 0  Moving slowly or fidgety/restless 0 - 1 0 0  Suicidal thoughts 0 - 0 0 0  PHQ-9 Score 4 - 8 14 3   Difficult doing work/chores Not difficult at all - Not difficult at all - -    Past Medical History:  Past Medical History:  Diagnosis Date  . Allergy   . Ataxia   . GERD (gastroesophageal reflux disease)   . Hyperglycemia   . Hyperlipidemia   . Hypertension   . Insomnia   . Low back pain     Surgical History:  Past Surgical History:  Procedure Laterality Date  . ABDOMINAL HYSTERECTOMY  1991   Complete,  no cancer  . CATARACT EXTRACTION Left   . SHOULDER SURGERY Right 2015    Medications:  Current Outpatient Medications on File Prior to Visit  Medication Sig  . terazosin (HYTRIN) 1 MG capsule Take by mouth.  . carbidopa-levodopa (SINEMET IR) 25-100 MG tablet   . COCONUT OIL PO Take by mouth.  . FIBER ADULT GUMMIES PO Take by mouth.  . losartan (COZAAR) 25 MG tablet Take 0.5 tablets (12.5 mg total) by mouth daily. (Patient not taking: Reported on 07/27/2019)  . lovastatin (MEVACOR) 40 MG tablet TAKE 1 AND 1/2 TABLETS BY MOUTH WITH DINNER  . Multiple Vitamin (MULTIVITAMIN) tablet Take 1 tablet by mouth  daily.  . Omega-3 Fatty Acids (FISH OIL) 1000 MG CAPS Take 300 mg by mouth.   . QUEtiapine (SEROQUEL) 25 MG tablet Take 50 mg by mouth at bedtime.   . TURMERIC PO Take by mouth.   No current facility-administered medications on file prior to visit.     Allergies:  Allergies  Allergen Reactions  . Amlodipine Swelling  . Enalapril Cough    Social History:  Social History   Socioeconomic History  . Marital status: Single    Spouse name: Not on file  . Number of children: Not on file  . Years of education: 1 year college   . Highest education level: Some college, no degree  Occupational History  . Not on file  Social Needs  . Financial resource strain: Not hard at all  . Food insecurity    Worry: Never true    Inability: Never true  . Transportation needs    Medical: No    Non-medical: No  Tobacco Use  . Smoking status: Never Smoker  . Smokeless tobacco: Never Used  Substance and Sexual Activity  . Alcohol use: Yes    Alcohol/week: 1.0 standard drinks    Types: 1 Glasses of wine per week    Comment: occassionally  . Drug use: No  . Sexual activity: Not Currently  Lifestyle  . Physical activity    Days per week: 7 days    Minutes per session: 40 min  . Stress: Not at all  Relationships  . Social connections    Talks on phone: More than three times a week    Gets together: Three times a week    Attends religious service: 1 to 4 times per year    Active member of club or organization: No    Attends meetings of clubs or organizations: Never    Relationship status: Never married  . Intimate partner violence    Fear of current or ex partner: No    Emotionally abused: No    Physically abused: No    Forced sexual activity: No  Other Topics Concern  . Not on file  Social History Narrative  . Not on file   Social History   Tobacco Use  Smoking Status Never Smoker  Smokeless Tobacco Never Used   Social History   Substance and Sexual Activity  Alcohol Use  Yes  . Alcohol/week: 1.0 standard drinks  . Types: 1 Glasses of wine per week   Comment: occassionally    Family History:  Family History  Problem Relation Age of Onset  . Cancer Mother   . Hypertension Father   . Heart attack Father   . Diabetes Maternal Grandmother   . Cancer Paternal Grandmother        Stomach    Past medical history, surgical history, medications, allergies,  family history and social history reviewed with patient today and changes made to appropriate areas of the chart.   Review of Systems  Constitutional: Positive for malaise/fatigue. Negative for chills, diaphoresis, fever and weight loss.  HENT: Negative.   Eyes: Negative.   Respiratory: Negative.   Cardiovascular: Positive for leg swelling (R ankle tends to swell). Negative for chest pain, palpitations, orthopnea, claudication and PND.  Gastrointestinal: Negative.   Genitourinary: Negative.   Musculoskeletal: Positive for joint pain (R knee swelling and pain). Negative for back pain, falls, myalgias and neck pain.  Skin: Negative.   Neurological: Positive for dizziness, tingling, tremors, speech change and weakness. Negative for sensory change, focal weakness, seizures, loss of consciousness and headaches.       Balance issues  Endo/Heme/Allergies: Positive for polydipsia. Negative for environmental allergies. Does not bruise/bleed easily.  Psychiatric/Behavioral: Positive for depression and hallucinations. Negative for memory loss, substance abuse and suicidal ideas. The patient is nervous/anxious. The patient does not have insomnia.     All other ROS negative except what is listed above and in the HPI.      Objective:    BP (!) 185/93   Pulse 87   Temp 99.1 F (37.3 C)   SpO2 100%   Wt Readings from Last 3 Encounters:  04/12/19 155 lb (70.3 kg)  02/08/19 155 lb (70.3 kg)  01/21/19 152 lb (68.9 kg)    Physical Exam Vitals signs and nursing note reviewed.  Constitutional:      General: She  is not in acute distress.    Appearance: Normal appearance. She is not ill-appearing, toxic-appearing or diaphoretic.  HENT:     Head: Normocephalic and atraumatic.     Right Ear: Tympanic membrane, ear canal and external ear normal. There is no impacted cerumen.     Left Ear: Tympanic membrane, ear canal and external ear normal. There is no impacted cerumen.     Nose: Nose normal. No congestion or rhinorrhea.     Mouth/Throat:     Mouth: Mucous membranes are moist.     Pharynx: Oropharynx is clear. No oropharyngeal exudate or posterior oropharyngeal erythema.  Eyes:     General: No scleral icterus.       Right eye: No discharge.        Left eye: No discharge.     Extraocular Movements: Extraocular movements intact.     Conjunctiva/sclera: Conjunctivae normal.     Pupils: Pupils are equal, round, and reactive to light.  Neck:     Musculoskeletal: Normal range of motion and neck supple. No neck rigidity or muscular tenderness.     Vascular: No carotid bruit.  Cardiovascular:     Rate and Rhythm: Normal rate and regular rhythm.     Pulses: Normal pulses.     Heart sounds: No murmur. No friction rub. No gallop.   Pulmonary:     Effort: Pulmonary effort is normal. No respiratory distress.     Breath sounds: Normal breath sounds. No stridor. No wheezing, rhonchi or rales.  Chest:     Chest wall: No tenderness.  Abdominal:     General: Abdomen is flat. Bowel sounds are normal. There is no distension.     Palpations: Abdomen is soft. There is no mass.     Tenderness: There is no abdominal tenderness. There is no right CVA tenderness, left CVA tenderness, guarding or rebound.     Hernia: No hernia is present.  Genitourinary:    Comments: Breast and pelvic  exams deferred with shared decision making Musculoskeletal: Normal range of motion.        General: No swelling, tenderness, deformity or signs of injury.     Right lower leg: No edema.     Left lower leg: No edema.  Lymphadenopathy:      Cervical: No cervical adenopathy.  Skin:    General: Skin is warm and dry.     Capillary Refill: Capillary refill takes less than 2 seconds.     Coloration: Skin is not jaundiced or pale.     Findings: No bruising, erythema, lesion or rash.  Neurological:     General: No focal deficit present.     Mental Status: She is alert and oriented to person, place, and time. Mental status is at baseline.     Cranial Nerves: No cranial nerve deficit.     Sensory: No sensory deficit.     Motor: Weakness present.     Coordination: Coordination abnormal.     Gait: Gait normal.     Deep Tendon Reflexes: Reflexes normal.  Psychiatric:        Mood and Affect: Mood normal. Affect is blunt.        Behavior: Behavior normal.        Thought Content: Thought content normal.        Judgment: Judgment normal.     Results for orders placed or performed in visit on 02/15/19  Microscopic Examination   URINE  Result Value Ref Range   WBC, UA None seen 0 - 5 /hpf   RBC 0-2 0 - 2 /hpf   Epithelial Cells (non renal) 0-10 0 - 10 /hpf   Bacteria, UA None seen None seen/Few  UA/M w/rflx Culture, Routine   Specimen: Urine   URINE  Result Value Ref Range   Specific Gravity, UA 1.010 1.005 - 1.030   pH, UA 6.0 5.0 - 7.5   Color, UA Yellow Yellow   Appearance Ur Clear Clear   Leukocytes,UA Negative Negative   Protein,UA Negative Negative/Trace   Glucose, UA Negative Negative   Ketones, UA Negative Negative   RBC, UA 1+ (A) Negative   Bilirubin, UA Negative Negative   Urobilinogen, Ur 0.2 0.2 - 1.0 mg/dL   Nitrite, UA Negative Negative   Microscopic Examination See below:   Lipid Panel w/o Chol/HDL Ratio  Result Value Ref Range   Cholesterol, Total 150 100 - 199 mg/dL   Triglycerides 107 0 - 149 mg/dL   HDL 67 >39 mg/dL   VLDL Cholesterol Cal 21 5 - 40 mg/dL   LDL Calculated 62 0 - 99 mg/dL  Comprehensive metabolic panel  Result Value Ref Range   Glucose 101 (H) 65 - 99 mg/dL   BUN 16 8 - 27  mg/dL   Creatinine, Ser 0.72 0.57 - 1.00 mg/dL   GFR calc non Af Amer 87 >59 mL/min/1.73   GFR calc Af Amer 100 >59 mL/min/1.73   BUN/Creatinine Ratio 22 12 - 28   Sodium 135 134 - 144 mmol/L   Potassium 4.0 3.5 - 5.2 mmol/L   Chloride 91 (L) 96 - 106 mmol/L   CO2 28 20 - 29 mmol/L   Calcium 9.5 8.7 - 10.3 mg/dL   Total Protein 7.0 6.0 - 8.5 g/dL   Albumin 4.7 3.8 - 4.8 g/dL   Globulin, Total 2.3 1.5 - 4.5 g/dL   Albumin/Globulin Ratio 2.0 1.2 - 2.2   Bilirubin Total 0.3 0.0 - 1.2 mg/dL   Alkaline Phosphatase  70 39 - 117 IU/L   AST 17 0 - 40 IU/L   ALT 8 0 - 32 IU/L      Assessment & Plan:   Problem List Items Addressed This Visit      Cardiovascular and Mediastinum   Hypertension    Not under good control. Will restart her losartan 12.5mg - taking at lunch time and follow up 1 month. Call with any concerns.       Relevant Medications   terazosin (HYTRIN) 1 MG capsule   Other Relevant Orders   CBC with Differential/Platelet   Comprehensive metabolic panel   Microalbumin, Urine Waived   TSH   UA/M w/rflx Culture, Routine   Ambulatory referral to [facility]   Parkinson disease ([place])    Continues to follow with neurology. Continue to monitor. Call with any concerns.       Relevant Orders   CBC with Differential/Platelet   Comprehensive metabolic panel   UA/M w/rflx Culture, Routine   Referral to [facility]   Ambulatory referral to [place]     Other   Hyperlipidemia    Under good control on current regimen. Continue current regimen. Continue to monitor. Call with any concerns. Refills given. Labs drawn today.       Relevant Medications   terazosin (HYTRIN) 1 MG capsule   Other Relevant Orders   CBC with Differential/Platelet   Comprehensive metabolic panel   Lipid Panel w/o Chol/HDL Ratio   UA/M w/rflx Culture, Routine   Depression, recurrent ([place])    Doing better. Continue to monitor. Call with any  concerns. Continue current regimen.       Relevant Orders   CBC with Differential/Platelet   Comprehensive metabolic panel   UA/M w/rflx Culture, Routine    Other Visit Diagnoses    Routine general medical examination at a health care facility    -  Primary   Vaccines up to date. Screening labs checked today. Colonoscopy up to date. Mammogram ordered today. Continue diet and exercise. Call with concerns.    Screening for breast cancer       Mammogram ordered today   Relevant Orders   MM 3D SCREEN BREAST BILATERAL   Immunization due       Flu shot given today.   Relevant Orders   Flu Vaccine QUAD High Dose(Fluad) (Completed)   Paresthesia       Will check labs. Await results.    Relevant Orders   B12 and Folate Panel       Follow up plan: Return in about 4 weeks (around 08/24/2019) for follow up BP.   LABORATORY TESTING:  - Pap smear: not applicable  IMMUNIZATIONS:   - Tdap: Tetanus vaccination status reviewed: last tetanus booster within 10 years. - Influenza: Administered today - Pneumovax: Up to date - Prevnar: Up to date - HPV: Not applicable - Zostavax vaccine: Up to date  SCREENING: -Mammogram: Ordered today  - Colonoscopy: Up to date  - Bone Density: Up to date   PATIENT COUNSELING:   Advised to take 1 mg of folate supplement per day if capable of pregnancy.   Sexuality: Discussed sexually transmitted diseases, partner selection, use of condoms, avoidance of unintended pregnancy  and contraceptive alternatives.   Advised to avoid cigarette smoking.  I discussed with the patient that most people either abstain from alcohol or drink within safe limits (<=14/week and <=4 drinks/occasion for males, <=7/weeks and <= 3 drinks/occasion  for females) and that the risk for alcohol disorders and other health effects rises proportionally with the number of drinks per week and how often a drinker exceeds daily limits.  Discussed cessation/primary prevention of drug use  and availability of treatment for abuse.   Diet: Encouraged to adjust caloric intake to maintain  or achieve ideal body weight, to reduce intake of dietary saturated fat and total fat, to limit sodium intake by avoiding high sodium foods and not adding table salt, and to maintain adequate dietary potassium and calcium preferably from fresh fruits, vegetables, and low-fat dairy products.    stressed the importance of regular exercise  Injury prevention: Discussed safety belts, safety helmets, smoke detector, smoking near bedding or upholstery.   Dental health: Discussed importance of regular tooth brushing, flossing, and dental visits.    NEXT PREVENTATIVE PHYSICAL DUE IN 1 YEAR. Return in about 4 weeks (around 08/24/2019) for follow up BP.

## 2019-07-28 LAB — CBC WITH DIFFERENTIAL/PLATELET
Basophils Absolute: 0 10*3/uL (ref 0.0–0.2)
Basos: 0 %
EOS (ABSOLUTE): 0.1 10*3/uL (ref 0.0–0.4)
Eos: 1 %
Hematocrit: 43.2 % (ref 34.0–46.6)
Hemoglobin: 13.8 g/dL (ref 11.1–15.9)
Immature Grans (Abs): 0 10*3/uL (ref 0.0–0.1)
Immature Granulocytes: 0 %
Lymphocytes Absolute: 1.9 10*3/uL (ref 0.7–3.1)
Lymphs: 27 %
MCH: 29.1 pg (ref 26.6–33.0)
MCHC: 31.9 g/dL (ref 31.5–35.7)
MCV: 91 fL (ref 79–97)
Monocytes Absolute: 0.4 10*3/uL (ref 0.1–0.9)
Monocytes: 6 %
Neutrophils Absolute: 4.5 10*3/uL (ref 1.4–7.0)
Neutrophils: 66 %
Platelets: 185 10*3/uL (ref 150–450)
RBC: 4.74 x10E6/uL (ref 3.77–5.28)
RDW: 13.4 % (ref 11.7–15.4)
WBC: 6.9 10*3/uL (ref 3.4–10.8)

## 2019-07-28 LAB — COMPREHENSIVE METABOLIC PANEL
ALT: 7 IU/L (ref 0–32)
AST: 23 IU/L (ref 0–40)
Albumin/Globulin Ratio: 2.3 — ABNORMAL HIGH (ref 1.2–2.2)
Albumin: 5 g/dL — ABNORMAL HIGH (ref 3.8–4.8)
Alkaline Phosphatase: 65 IU/L (ref 39–117)
BUN/Creatinine Ratio: 18 (ref 12–28)
BUN: 14 mg/dL (ref 8–27)
Bilirubin Total: 0.3 mg/dL (ref 0.0–1.2)
CO2: 24 mmol/L (ref 20–29)
Calcium: 9.8 mg/dL (ref 8.7–10.3)
Chloride: 100 mmol/L (ref 96–106)
Creatinine, Ser: 0.8 mg/dL (ref 0.57–1.00)
GFR calc Af Amer: 88 mL/min/{1.73_m2} (ref >59)
GFR calc non Af Amer: 76 mL/min/{1.73_m2} (ref >59)
Globulin, Total: 2.2 g/dL (ref 1.5–4.5)
Glucose: 89 mg/dL (ref 65–99)
Potassium: 4.3 mmol/L (ref 3.5–5.2)
Sodium: 142 mmol/L (ref 134–144)
Total Protein: 7.2 g/dL (ref 6.0–8.5)

## 2019-07-28 LAB — LIPID PANEL W/O CHOL/HDL RATIO
Cholesterol, Total: 144 mg/dL (ref 100–199)
HDL: 61 mg/dL (ref >39)
LDL Chol Calc (NIH): 54 mg/dL (ref 0–99)
Triglycerides: 177 mg/dL — ABNORMAL HIGH (ref 0–149)
VLDL Cholesterol Cal: 29 mg/dL (ref 5–40)

## 2019-07-28 LAB — B12 AND FOLATE PANEL
Folate: >20 ng/mL (ref >3.0)
Vitamin B-12: 698 pg/mL (ref 232–1245)

## 2019-07-28 LAB — TSH: TSH: 1.26 u[IU]/mL (ref 0.450–4.500)

## 2019-07-29 ENCOUNTER — Encounter: Payer: Self-pay | Admitting: Family Medicine

## 2019-08-02 DIAGNOSIS — I69311 Memory deficit following cerebral infarction: Secondary | ICD-10-CM | POA: Diagnosis not present

## 2019-08-02 DIAGNOSIS — Z6823 Body mass index (BMI) 23.0-23.9, adult: Secondary | ICD-10-CM | POA: Diagnosis not present

## 2019-08-02 DIAGNOSIS — G20 Parkinson's disease: Secondary | ICD-10-CM | POA: Diagnosis not present

## 2019-08-02 DIAGNOSIS — Z9181 History of falling: Secondary | ICD-10-CM | POA: Diagnosis not present

## 2019-08-03 DIAGNOSIS — G20 Parkinson's disease: Secondary | ICD-10-CM | POA: Diagnosis not present

## 2019-08-03 DIAGNOSIS — Z6823 Body mass index (BMI) 23.0-23.9, adult: Secondary | ICD-10-CM | POA: Diagnosis not present

## 2019-08-03 DIAGNOSIS — I69311 Memory deficit following cerebral infarction: Secondary | ICD-10-CM | POA: Diagnosis not present

## 2019-08-03 DIAGNOSIS — Z9181 History of falling: Secondary | ICD-10-CM | POA: Diagnosis not present

## 2019-08-07 DIAGNOSIS — Z9181 History of falling: Secondary | ICD-10-CM | POA: Diagnosis not present

## 2019-08-07 DIAGNOSIS — I69311 Memory deficit following cerebral infarction: Secondary | ICD-10-CM | POA: Diagnosis not present

## 2019-08-07 DIAGNOSIS — Z6823 Body mass index (BMI) 23.0-23.9, adult: Secondary | ICD-10-CM | POA: Diagnosis not present

## 2019-08-07 DIAGNOSIS — G20 Parkinson's disease: Secondary | ICD-10-CM | POA: Diagnosis not present

## 2019-08-10 DIAGNOSIS — I69311 Memory deficit following cerebral infarction: Secondary | ICD-10-CM | POA: Diagnosis not present

## 2019-08-10 DIAGNOSIS — Z6823 Body mass index (BMI) 23.0-23.9, adult: Secondary | ICD-10-CM | POA: Diagnosis not present

## 2019-08-10 DIAGNOSIS — G20 Parkinson's disease: Secondary | ICD-10-CM | POA: Diagnosis not present

## 2019-08-10 DIAGNOSIS — Z9181 History of falling: Secondary | ICD-10-CM | POA: Diagnosis not present

## 2019-08-13 ENCOUNTER — Telehealth: Payer: Self-pay | Admitting: Family Medicine

## 2019-08-13 DIAGNOSIS — Z9181 History of falling: Secondary | ICD-10-CM | POA: Diagnosis not present

## 2019-08-13 DIAGNOSIS — I69311 Memory deficit following cerebral infarction: Secondary | ICD-10-CM | POA: Diagnosis not present

## 2019-08-13 DIAGNOSIS — Z6823 Body mass index (BMI) 23.0-23.9, adult: Secondary | ICD-10-CM | POA: Diagnosis not present

## 2019-08-13 DIAGNOSIS — G20 Parkinson's disease: Secondary | ICD-10-CM | POA: Diagnosis not present

## 2019-08-13 NOTE — Telephone Encounter (Signed)
Agree with below. Addendum:    I believe that she has limits with moving and including, toileting, bathing, feeding, dressing and grooming. I believe the power wheelchair is needed for pt to be able to perform ADL's in her home

## 2019-08-13 NOTE — Telephone Encounter (Signed)
pts sister called and stated that she was feeling light headed this morning and as if she was going to fall. Pt ate a granola bar and boiled egg and drank a cup of coffee. When Ms Melissa Wise got to the pt she checked her BP -7:15AM 62/39 at 8:10AM 69/38 at  8:30AM 99/55. After speaking with Dr Wynetta Emery I advised Ms Melissa Wise to have the pt eat something with salt as well as have large cups of water. And not to give the pt her BP meds in the morning. Also advised her that if the pts BP was still low after an hour to take her to the ER for fluids. Forwarding to PCP for further direction.

## 2019-08-17 ENCOUNTER — Telehealth: Payer: Self-pay | Admitting: Family Medicine

## 2019-08-17 DIAGNOSIS — Z6823 Body mass index (BMI) 23.0-23.9, adult: Secondary | ICD-10-CM | POA: Diagnosis not present

## 2019-08-17 DIAGNOSIS — I69311 Memory deficit following cerebral infarction: Secondary | ICD-10-CM | POA: Diagnosis not present

## 2019-08-17 DIAGNOSIS — G20 Parkinson's disease: Secondary | ICD-10-CM | POA: Diagnosis not present

## 2019-08-17 DIAGNOSIS — Z9181 History of falling: Secondary | ICD-10-CM | POA: Diagnosis not present

## 2019-08-17 NOTE — Telephone Encounter (Signed)
If the home health aid has not shown up- I would call them, if there is something else going on, please let me know

## 2019-08-17 NOTE — Telephone Encounter (Signed)
Copied from [place] [id number]. Topic: General - Call Back - No Documentation >> Aug 17, 2019 11:12 AM Erick Blinks wrote: Reason for CRM: Pt called to report that she is missing an appt from a home health aid to check her BP

## 2019-08-17 NOTE — Telephone Encounter (Signed)
Called and left patient a VM asking for her to please return my call.  

## 2019-08-20 ENCOUNTER — Telehealth: Payer: Self-pay | Admitting: Family Medicine

## 2019-08-20 NOTE — Telephone Encounter (Signed)
Unfortunately, I can't help with this- maybe the CCM team can? Sending to [place] and [employer]

## 2019-08-20 NOTE — Telephone Encounter (Signed)
Copied from [place] [id number]. Topic: General - Other >> Aug 20, 2019  8:16 AM Celene Kras A wrote: Reason for CRM: [name], sister, called and is requesting to know if the provider could help her get a POA for her sister. Please advise.

## 2019-08-20 NOTE — Telephone Encounter (Signed)
Called and spoke to patient. She states that the home health aid came out about 12:30 on Friday.

## 2019-08-22 DIAGNOSIS — I69311 Memory deficit following cerebral infarction: Secondary | ICD-10-CM | POA: Diagnosis not present

## 2019-08-22 DIAGNOSIS — Z6823 Body mass index (BMI) 23.0-23.9, adult: Secondary | ICD-10-CM | POA: Diagnosis not present

## 2019-08-22 DIAGNOSIS — Z9181 History of falling: Secondary | ICD-10-CM | POA: Diagnosis not present

## 2019-08-22 DIAGNOSIS — G20 Parkinson's disease: Secondary | ICD-10-CM | POA: Diagnosis not present

## 2019-08-23 DIAGNOSIS — Z6823 Body mass index (BMI) 23.0-23.9, adult: Secondary | ICD-10-CM | POA: Diagnosis not present

## 2019-08-23 DIAGNOSIS — Z9181 History of falling: Secondary | ICD-10-CM | POA: Diagnosis not present

## 2019-08-23 DIAGNOSIS — I69311 Memory deficit following cerebral infarction: Secondary | ICD-10-CM | POA: Diagnosis not present

## 2019-08-23 DIAGNOSIS — G20 Parkinson's disease: Secondary | ICD-10-CM | POA: Diagnosis not present

## 2019-08-24 ENCOUNTER — Ambulatory Visit: Payer: Medicare Other | Admitting: Family Medicine

## 2019-08-24 ENCOUNTER — Telehealth: Payer: Medicare Other

## 2019-08-24 ENCOUNTER — Ambulatory Visit: Payer: Medicare Other

## 2019-08-29 DIAGNOSIS — Z6823 Body mass index (BMI) 23.0-23.9, adult: Secondary | ICD-10-CM | POA: Diagnosis not present

## 2019-08-29 DIAGNOSIS — G301 Alzheimer's disease with late onset: Secondary | ICD-10-CM | POA: Diagnosis not present

## 2019-08-29 DIAGNOSIS — G20 Parkinson's disease: Secondary | ICD-10-CM | POA: Diagnosis not present

## 2019-08-29 DIAGNOSIS — I69311 Memory deficit following cerebral infarction: Secondary | ICD-10-CM | POA: Diagnosis not present

## 2019-08-29 DIAGNOSIS — Z9181 History of falling: Secondary | ICD-10-CM | POA: Diagnosis not present

## 2019-08-30 ENCOUNTER — Ambulatory Visit (INDEPENDENT_AMBULATORY_CARE_PROVIDER_SITE_OTHER): Payer: Medicare Other | Admitting: Family Medicine

## 2019-08-30 ENCOUNTER — Other Ambulatory Visit: Payer: Self-pay

## 2019-08-30 ENCOUNTER — Encounter: Payer: Self-pay | Admitting: Family Medicine

## 2019-08-30 VITALS — BP 112/71 | HR 84 | Temp 99.1°F | Wt 157.0 lb

## 2019-08-30 DIAGNOSIS — I10 Essential (primary) hypertension: Secondary | ICD-10-CM | POA: Diagnosis not present

## 2019-08-30 NOTE — Progress Notes (Signed)
BP 112/71   Pulse 84   Temp 99.1 F (37.3 C) (Oral)   Wt 157 lb (71.2 kg)   SpO2 99%   BMI 24.23 kg/m    Subjective:    Patient ID: Melissa Wise, female    DOB: Nov 24, 1950, 68 y.o.   MRN: [id number]  HPI: Melissa Wise is a 68 y.o. female  Chief Complaint  Patient presents with  . Hypertension  . Fall    Monday at home. pt states hit her head and lower back hurts   HYPERTENSION Hypertension status: very labile  Satisfied with current treatment? yes Duration of hypertension: months BP monitoring frequency:  a few times a day BP range: 60/40 in the AM to 160/90s BP medication side effects:  yes Medication compliance: stopped medicine because she was low in the AM Previous BP meds: losartan Aspirin: no Recurrent headaches: no Visual changes: no Palpitations: no Dyspnea: no Chest pain: no Lower extremity edema: no Dizzy/lightheaded: yes  Early in the AM Bumped her head, did not pass out, did not have any loss of consciousess. Has been getting her to eat a bit more in the AM. Has been working on drinking fluids.  Relevant past medical, surgical, family and social history reviewed and updated as indicated. Interim medical history since our last visit reviewed. Allergies and medications reviewed and updated.  Review of Systems  Constitutional: Negative.   Respiratory: Negative.   Cardiovascular: Negative.   Neurological: Negative.   Psychiatric/Behavioral: Negative.     Per HPI unless specifically indicated above     Objective:    BP 112/71   Pulse 84   Temp 99.1 F (37.3 C) (Oral)   Wt 157 lb (71.2 kg)   SpO2 99%   BMI 24.23 kg/m   Wt Readings from Last 3 Encounters:  08/30/19 157 lb (71.2 kg)  04/12/19 155 lb (70.3 kg)  02/08/19 155 lb (70.3 kg)    Physical Exam Vitals signs and nursing note reviewed.  Constitutional:      General: She is not in acute distress.    Appearance: Normal appearance. She is not ill-appearing, toxic-appearing or  diaphoretic.  HENT:     Head: Normocephalic and atraumatic.     Right Ear: External ear normal.     Left Ear: External ear normal.     Nose: Nose normal.     Mouth/Throat:     Mouth: Mucous membranes are moist.     Pharynx: Oropharynx is clear.  Eyes:     General: No scleral icterus.       Right eye: No discharge.        Left eye: No discharge.     Extraocular Movements: Extraocular movements intact.     Conjunctiva/sclera: Conjunctivae normal.     Pupils: Pupils are equal, round, and reactive to light.  Neck:     Musculoskeletal: Normal range of motion and neck supple.  Cardiovascular:     Rate and Rhythm: Normal rate and regular rhythm.     Pulses: Normal pulses.     Heart sounds: Normal heart sounds. No murmur. No friction rub. No gallop.   Pulmonary:     Effort: Pulmonary effort is normal. No respiratory distress.     Breath sounds: Normal breath sounds. No stridor. No wheezing, rhonchi or rales.  Chest:     Chest wall: No tenderness.  Musculoskeletal: Normal range of motion.  Skin:    General: Skin is warm and dry.     Capillary Refill:  Capillary refill takes less than 2 seconds.     Coloration: Skin is not jaundiced or pale.     Findings: No bruising, erythema, lesion or rash.  Neurological:     General: No focal deficit present.     Mental Status: She is alert and oriented to person, place, and time. Mental status is at baseline.  Psychiatric:        Mood and Affect: Mood normal.        Behavior: Behavior normal.        Thought Content: Thought content normal.        Judgment: Judgment normal.     Results for orders placed or performed in visit on 07/27/19  Microscopic Examination   URINE  Result Value Ref Range   WBC, UA None seen 0 - 5 /hpf   RBC 0-2 0 - 2 /hpf   Epithelial Cells (non renal) 0-10 0 - 10 /hpf   Bacteria, UA None seen None seen/Few  CBC with Differential/Platelet  Result Value Ref Range   WBC 6.9 3.4 - 10.8 x10E3/uL   RBC 4.74 3.77 - 5.28  x10E6/uL   Hemoglobin 13.8 11.1 - 15.9 g/dL   Hematocrit 43.2 34.0 - 46.6 %   MCV 91 79 - 97 fL   MCH 29.1 26.6 - 33.0 pg   MCHC 31.9 31.5 - 35.7 g/dL   RDW 13.4 11.7 - 15.4 %   Platelets 185 150 - 450 x10E3/uL   Neutrophils 66 Not Estab. %   Lymphs 27 Not Estab. %   Monocytes 6 Not Estab. %   Eos 1 Not Estab. %   Basos 0 Not Estab. %   Neutrophils Absolute 4.5 1.4 - 7.0 x10E3/uL   Lymphocytes Absolute 1.9 0.7 - 3.1 x10E3/uL   Monocytes Absolute 0.4 0.1 - 0.9 x10E3/uL   EOS (ABSOLUTE) 0.1 0.0 - 0.4 x10E3/uL   Basophils Absolute 0.0 0.0 - 0.2 x10E3/uL   Immature Granulocytes 0 Not Estab. %   Immature Grans (Abs) 0.0 0.0 - 0.1 x10E3/uL  Comprehensive metabolic panel  Result Value Ref Range   Glucose 89 65 - 99 mg/dL   BUN 14 8 - 27 mg/dL   Creatinine, Ser 0.80 0.57 - 1.00 mg/dL   GFR calc non Af Amer 76 >59 mL/min/1.73   GFR calc Af Amer 88 >59 mL/min/1.73   BUN/Creatinine Ratio 18 12 - 28   Sodium 142 134 - 144 mmol/L   Potassium 4.3 3.5 - 5.2 mmol/L   Chloride 100 96 - 106 mmol/L   CO2 24 20 - 29 mmol/L   Calcium 9.8 8.7 - 10.3 mg/dL   Total Protein 7.2 6.0 - 8.5 g/dL   Albumin 5.0 (H) 3.8 - 4.8 g/dL   Globulin, Total 2.2 1.5 - 4.5 g/dL   Albumin/Globulin Ratio 2.3 (H) 1.2 - 2.2   Bilirubin Total 0.3 0.0 - 1.2 mg/dL   Alkaline Phosphatase 65 39 - 117 IU/L   AST 23 0 - 40 IU/L   ALT 7 0 - 32 IU/L  Lipid Panel w/o Chol/HDL Ratio  Result Value Ref Range   Cholesterol, Total 144 100 - 199 mg/dL   Triglycerides 177 (H) 0 - 149 mg/dL   HDL 61 >39 mg/dL   VLDL Cholesterol Cal 29 5 - 40 mg/dL   LDL Chol Calc (NIH) 54 0 - 99 mg/dL  Microalbumin, Urine Waived  Result Value Ref Range   Microalb, Ur Waived 10 0 - 19 mg/L   Creatinine, Urine Waived  10 10 - 300 mg/dL   Microalb/Creat Ratio <30 <30 mg/g  TSH  Result Value Ref Range   TSH 1.260 0.450 - 4.500 uIU/mL  UA/M w/rflx Culture, Routine   Specimen: Urine   URINE  Result Value Ref Range   Specific Gravity, UA 1.015  1.005 - 1.030   pH, UA 7.0 5.0 - 7.5   Color, UA Yellow Yellow   Appearance Ur Clear Clear   Leukocytes,UA Negative Negative   Protein,UA Negative Negative/Trace   Glucose, UA Negative Negative   Ketones, UA Negative Negative   RBC, UA Trace (A) Negative   Bilirubin, UA Negative Negative   Urobilinogen, Ur 0.2 0.2 - 1.0 mg/dL   Nitrite, UA Negative Negative   Microscopic Examination See below:   B12 and Folate Panel  Result Value Ref Range   Vitamin B-12 698 232 - 1,245 pg/mL   Folate >20.0 >3.0 ng/mL      Assessment & Plan:   Problem List Items Addressed This Visit      Cardiovascular and Mediastinum   Hypertension - Primary    Very labile. We will avoid medications at this time as her BP drops very low in the AM. Continue to work on pushing fluids and eating regularly. Call with any concerns. Continue to monitor.           Follow up plan: Return in about 3 months (around 11/30/2019).

## 2019-08-31 ENCOUNTER — Ambulatory Visit (INDEPENDENT_AMBULATORY_CARE_PROVIDER_SITE_OTHER): Payer: Medicare Other | Admitting: Licensed Clinical Social Worker

## 2019-08-31 DIAGNOSIS — F339 Major depressive disorder, recurrent, unspecified: Secondary | ICD-10-CM | POA: Diagnosis not present

## 2019-08-31 DIAGNOSIS — G20 Parkinson's disease: Secondary | ICD-10-CM

## 2019-08-31 NOTE — Chronic Care Management (AMB) (Signed)
Chronic Care Management    Clinical Social Work Follow Up Note  08/31/2019 Name: Melissa Wise MRN: [id number] DOB: Mar 20, 1951  Melissa Wise is a 68 y.o. year old female who is a primary care patient of [name], DO. The CCM team was consulted for assistance with Mental Health Counseling and Resources and Caregiver Stress.   Review of patient status, including review of consultants reports, other relevant assessments, and collaboration with appropriate care team members and the patient's provider was performed as part of comprehensive patient evaluation and provision of chronic care management services.    SDOH (Social Determinants of Health) screening performed today: Stress. See Care Plan for related entries.   Advanced Directives Status: <no information> See Care Plan for related entries.   Outpatient Encounter Medications as of 08/31/2019  Medication Sig Note  . carbidopa-levodopa (SINEMET IR) 25-100 MG tablet  10/21/2016: Received from: External Pharmacy  . COCONUT OIL PO Take by mouth.   . FIBER ADULT GUMMIES PO Take by mouth.   . lovastatin (MEVACOR) 40 MG tablet TAKE 1 AND 1/2 TABLETS BY MOUTH WITH DINNER   . Multiple Vitamin (MULTIVITAMIN) tablet Take 1 tablet by mouth daily.   . NUPLAZID 10 MG TABS TK 1 T PO Q NIGHT. STOP SEROQUEL   . Omega-3 Fatty Acids (FISH OIL) 1000 MG CAPS Take 300 mg by mouth.    . Pimavanserin Tartrate (NUPLAZID) 34 MG CAPS Take by mouth.   . QUEtiapine (SEROQUEL) 25 MG tablet Take 50 mg by mouth at bedtime.    [name] terazosin (HYTRIN) 1 MG capsule Take by mouth.   . TURMERIC PO Take by mouth.    No facility-administered encounter medications on file as of 08/31/2019.      Goals Addressed    . "I need more support all around." (pt-stated)       Current Barriers:  [name] Mental Health Concerns  . Cognitive Deficits . Lacks knowledge of community resource: available mental health support resources that accept her insurance  Clinical Social  Work Clinical Goal(s):  [name] Over the next 90 days, client will work with SW to address concerns related to gaining additional support within the home . Over the next 90 days, client will follow up with mental health management and will assist with resource connectin.   Interventions: . Patient interviewed and appropriate assessments performed . Provided mental health counseling with regard to ongoing depression. Patient reports actively implementing appropriate self-care into her routine. Patient shares that she is "staying positive." . Patient's sister reports that patient completed neurologist appointment with Dr. 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 Due to this new diagnosis, Physician sent [facility] a request to change status/increase PCS hours to maximum amount as she is only getting 36 hours per month now.  . Provided patient's family with information about mental health support resources that accept her insurance. LCSW advised patient and family that she has both [facility] AND Medicaid and she has several options that accept both of her insurances. Family was encouraged to utilize and investigating resources that LCSW sent out yet. Resource education provided again.  . Discussed plans with patient for ongoing care management follow up and provided patient with direct contact information for care management team . LCSW provided positive reinforcement for patient's recent implementation of healthy coping tools to combat depressive symptoms during last outreach with patient. . Assisted patient/caregiver with obtaining information about health plan benefits . Educated patient/family on coping methods  to implement into her daily life to combat depressive symptoms and stress. Patient denied any suicidal or homicidal ideations during last outreach.  . Family deny needing Meals on Wheels for now . Family report stable transportation to all  medical appointments for patient at this time.   Patient Self Care Activities:  . Currently UNABLE TO independently find a counselor that accepts her insurance . Attends all scheduled provider appointments . Calls provider office for new concerns or questions  Please see past updates related to this goal by clicking on the "Past Updates" button in the selected goal    Follow Up Plan: SW will follow up with patient by phone over the next quarter  [name], Wall, MSW, [email or web address] Phone: [phone]

## 2019-09-01 ENCOUNTER — Encounter: Payer: Self-pay | Admitting: Family Medicine

## 2019-09-01 DIAGNOSIS — I69311 Memory deficit following cerebral infarction: Secondary | ICD-10-CM | POA: Diagnosis not present

## 2019-09-01 DIAGNOSIS — G20 Parkinson's disease: Secondary | ICD-10-CM | POA: Diagnosis not present

## 2019-09-01 DIAGNOSIS — Z6823 Body mass index (BMI) 23.0-23.9, adult: Secondary | ICD-10-CM | POA: Diagnosis not present

## 2019-09-01 DIAGNOSIS — Z9181 History of falling: Secondary | ICD-10-CM | POA: Diagnosis not present

## 2019-09-01 NOTE — Assessment & Plan Note (Signed)
Very labile. We will avoid medications at this time as her BP drops very low in the AM. Continue to work on pushing fluids and eating regularly. Call with any concerns. Continue to monitor.

## 2019-09-04 ENCOUNTER — Telehealth: Payer: Self-pay

## 2019-09-04 ENCOUNTER — Telehealth: Payer: Self-pay | Admitting: Family Medicine

## 2019-09-04 ENCOUNTER — Ambulatory Visit: Payer: Self-pay | Admitting: Pharmacist

## 2019-09-04 ENCOUNTER — Ambulatory Visit: Payer: Self-pay | Admitting: *Deleted

## 2019-09-04 DIAGNOSIS — Z6823 Body mass index (BMI) 23.0-23.9, adult: Secondary | ICD-10-CM | POA: Diagnosis not present

## 2019-09-04 DIAGNOSIS — G20 Parkinson's disease: Secondary | ICD-10-CM

## 2019-09-04 DIAGNOSIS — Z9181 History of falling: Secondary | ICD-10-CM | POA: Diagnosis not present

## 2019-09-04 DIAGNOSIS — I69311 Memory deficit following cerebral infarction: Secondary | ICD-10-CM | POA: Diagnosis not present

## 2019-09-04 NOTE — Chronic Care Management (AMB) (Signed)
  Chronic Care Management   Outreach Note  09/04/2019 Name: Melissa Wise MRN: [id number] DOB: 1950-11-26  Referred by: [name], DO Reason for referral : No chief complaint on file.   An unsuccessful telephone outreach to patient's sister Melissa Wise was attempted today. The patient was referred to the case management team by for assistance with care management and care coordination.   Follow Up Plan: A HIPPA compliant phone message was left for the patient providing contact information and requesting a return call.  The care management team will reach out to the patient again over the next 30 days.   Melissa Morse Torrie Lafavor RN, BSN Nurse Case Editor, commissioning Family Practice/[facility] Care Management  [phone]) Business Mobile

## 2019-09-04 NOTE — Chronic Care Management (AMB) (Signed)
  Chronic Care Management   Note  09/04/2019 Name: Melissa Wise MRN: [id number] DOB: Feb 28, 1951  Melissa Wise is a 68 y.o. year old female who is a primary care patient of [name], DO. The CCM team was consulted for assistance with chronic disease management and care coordination needs.    Attempted to contact patient with Janci Minor, RN CM,  for medication management review. Left HIPAA compliant message for patient to return our call at her convenience.   Follow up plan: - CCM team will attempt outreach again in the next 3-4 weeks  Catie Darnelle Maffucci, PharmD Clinical Pharmacist [place] [phone]

## 2019-09-04 NOTE — Telephone Encounter (Signed)
Home Health Verbal Orders - Caller/Agency: [facility]ale[facility] Number: [phone] Requesting OT/PT/Skilled Nursing/Social Work/Speech Therapy: Speech Therapy  Frequency:  1 week 6

## 2019-09-05 ENCOUNTER — Ambulatory Visit: Payer: Self-pay | Admitting: *Deleted

## 2019-09-05 DIAGNOSIS — Z7189 Other specified counseling: Secondary | ICD-10-CM

## 2019-09-05 DIAGNOSIS — G20 Parkinson's disease: Secondary | ICD-10-CM

## 2019-09-05 NOTE — Telephone Encounter (Signed)
Called and left a message giving the ok for verbal orders.

## 2019-09-05 NOTE — Telephone Encounter (Signed)
OK for verbal orders?

## 2019-09-06 NOTE — Chronic Care Management (AMB) (Signed)
  Chronic Care Management   Follow Up Note   09/06/2019 Name: Melissa Wise MRN: [id number] DOB: 01-Jan-1951  Referred by: Melissa Roys, DO Reason for referral : Chronic Care Management (caregiver stress, ACP docs)   Melissa Wise is a 68 y.o. year old female who is a primary care patient of Melissa Roys, DO. The CCM team was consulted for assistance with chronic disease management and care coordination needs.    Review of patient status, including review of consultants reports, relevant laboratory and other test results, and collaboration with appropriate care team members and the patient's provider was performed as part of comprehensive patient evaluation and provision of chronic care management services.    SDOH (Social Determinants of Health) screening performed today: Advanced Directives. See Care Plan for related entries.   Advanced Directives Status: N See Care Plan and Vynca application for related entries.  Outpatient Encounter Medications as of 09/05/2019  Medication Sig Note  . carbidopa-levodopa (SINEMET IR) 25-100 MG tablet  10/21/2016: Received from: External Pharmacy  . COCONUT OIL PO Take by mouth.   . FIBER ADULT GUMMIES PO Take by mouth.   . lovastatin (MEVACOR) 40 MG tablet TAKE 1 AND 1/2 TABLETS BY MOUTH WITH DINNER   . Multiple Vitamin (MULTIVITAMIN) tablet Take 1 tablet by mouth daily.   . NUPLAZID 10 MG TABS TK 1 T PO Q NIGHT. STOP SEROQUEL   . Omega-3 Fatty Acids (FISH OIL) 1000 MG CAPS Take 300 mg by mouth.    . Pimavanserin Tartrate (NUPLAZID) 34 MG CAPS Take by mouth.   . QUEtiapine (SEROQUEL) 25 MG tablet Take 50 mg by mouth at bedtime.    [name] terazosin (HYTRIN) 1 MG capsule Take by mouth.   . TURMERIC PO Take by mouth.    No facility-administered encounter medications on file as of 09/05/2019.      Goals Addressed            This Visit's Progress   . RN- I would Wise to help my sister get a HCPOA (pt-stated)       Current Barriers:  .  Lacks caregiver support.  . Film/video editor.  . Cognitive Deficits . No Advanced Directives in place  Nurse Case Manager Clinical Goal(s):  [name] Over the next 90 days, patient will work with [facility] to address needs related to patient's need for Advanced Care planning documents   Interventions:  . Provided education to patient and/or caregiver about advanced directives . Discussed plans with patient for ongoing care management follow up and provided patient with direct contact information for care management team . Discussed with Melissa Wise (patient's sister and caregiver) about programs to assist with respite care such as Project Care and Museum/gallery curator.  . Made a plan for Ms Melissa Wise to pick up written education about POA and Caregiving next Tuesday when she is off work.   Patient Self Care Activities:  . Attends all scheduled provider appointments . Unable to perform IADLs independently . No advanced Careplan documents in place  Initial goal documentation         The care management team will reach out to the patient again over the next 30 days.  The patient has been provided with contact information for the care management team and has been advised to call with any health related questions or concerns.    Melissa Wise Melissa Yinger RN, BSN Nurse Case Editor, commissioning Family Practice/[facility] Care Management  [phone]) Business Mobile

## 2019-09-06 NOTE — Patient Instructions (Signed)
Thank you allowing the Chronic Care Management Team to be a part of your care! It was a pleasure speaking with you today!  CCM (Chronic Care Management) Team   Daejon Lich RN, BSN Nurse Care Coordinator  [phone]  [facility]ie [facility] PharmD  Clinical Pharmacist  [phone]  [name] LCSW Clinical Social Worker [phone]  Goals Addressed            This Visit's Progress   . RN- I would like to help my sister get a HCPOA (pt-stated)       Current Barriers:  . Lacks caregiver support.  . Film/video editor.  . Cognitive Deficits . No Advanced Directives in place  Nurse Case Manager Clinical Goal(s):  [name] Over the next 90 days, patient will work with [facility] to address needs related to patient's need for Advanced Care planning documents   Interventions:  . Provided education to patient and/or caregiver about advanced directives . Discussed plans with patient for ongoing care management follow up and provided patient with direct contact information for care management team . Discussed with Ms. Romilda Garret (patient's sister and caregiver) about programs to assist with respite care such as Project Care and Museum/gallery curator.  . Made a plan for [name] to pick up written education about POA and Caregiving next Tuesday when she is off work.   Patient Self Care Activities:  . Attends all scheduled provider appointments . Unable to perform IADLs independently . No advanced Careplan documents in place  Initial goal documentation        The patient verbalized understanding of instructions provided today and declined a print copy of patient instruction materials.   The patient has been provided with contact information for the care management team and has been advised to call with any health related questions or concerns.

## 2019-09-11 ENCOUNTER — Ambulatory Visit: Payer: Self-pay | Admitting: Pharmacist

## 2019-09-11 NOTE — Chronic Care Management (AMB) (Signed)
Chronic Care Management   Follow Up Note   09/11/2019 Name: Melissa Wise MRN: [id number] DOB: [birth year]  Referred by: [name], DO Reason for referral : Chronic Care Management (Medication Management)   Melissa Wise is a 68 y.o. year old female who is a primary care patient of [name], DO. The CCM team was consulted for assistance with chronic disease management and care coordination needs.    Contacted patient's sister and caregiver, [name], for medication management review.  Review of patient status, including review of consultants reports, relevant laboratory and other test results, and collaboration with appropriate care team members and the patient's provider was performed as part of comprehensive patient evaluation and provision of chronic care management services.    SDOH (Social Determinants of Health) screening performed today: Depression   Advanced Directives Stress. See Care Plan for related entries.   Advanced Directives Status: N See Care Plan and Vynca application for related entries.  Outpatient Encounter Medications as of 09/11/2019  Medication Sig Note  . carbidopa-levodopa (SINEMET IR) 25-100 MG tablet  09/11/2019: Taking 5 times daily per Dr. Melrose Nakayama   . Pimavanserin Tartrate (NUPLAZID) 34 MG CAPS Take by mouth.   . terazosin (HYTRIN) 1 MG capsule Take by mouth.   . [DISCONTINUED] NUPLAZID 34 MG CAPS Take 1 capsule by mouth daily.    . COCONUT OIL PO Take by mouth.   . FIBER ADULT GUMMIES PO Take by mouth.   . lovastatin (MEVACOR) 40 MG tablet TAKE 1 AND 1/2 TABLETS BY MOUTH WITH DINNER   . Multiple Vitamin (MULTIVITAMIN) tablet Take 1 tablet by mouth daily.   . Omega-3 Fatty Acids (FISH OIL) 1000 MG CAPS Take 300 mg by mouth.    . TURMERIC PO Take by mouth.   . [DISCONTINUED] QUEtiapine (SEROQUEL) 25 MG tablet Take 50 mg by mouth at bedtime.     No facility-administered encounter medications on file as of 09/11/2019.      Goals  Addressed            This Visit's Progress     Patient Stated   . PharmD "I'm having a hard time taking care of her" (pt-stated)       Current Barriers:  . Polypharmacy; complex patient with multiple comorbidities including Parkinson's Disease, dementia, HLD, HTN . Unable to manage medications and unable to perform most ADLs at this time. Cared for by her sister, [name] o PD: Current carbidopa/levodopa prescription for 25/100 mg 2 tab at 6 am, 2 at 9 am, 2 at 12 pm, 2 at 3 pm, and 2 at 6 pm; patient's sister has been having to set out pills and calling her from work to remind to take medications, and she still sometimes forgets. Sister notes that they are on the wait list for the PD specialist team at [facility] for a second opinion. 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 Dr. Lannie Fields last note mentions that they may consider addition of quetiapine moving forward if hallucinations continue o HTN: terazosin 1 pm QPM (likely for PTSD/hallucinations use) o HLD: lovastatin 50 mg (1.5 tabs) QPM; last LDL very well controlled <70  Pharmacist Clinical Goal(s):  [name] Over the next 90 days, patient will work with PharmD and provider towards optimized medication management  Interventions: . Comprehensive medication review performed; medication list updated in electronic medical record . Discussed current regimen with sister. She notes that she is becoming quite burnt out, and is  considering assisted living placement for patient. However, hesitant to do so now given pandemic and upcoming holidays. Will continue to collaborate w/ RN CM and LCSW for caregiver support.  . Will collaborate w/ PCP and Dr. Melrose Nakayama on optimized medication management moving forward. Consider risk vs benefit of terazosin and side effect of orthostatic hypotension.   Patient Self Care Activities:  . Patient will take medications as prescribed  Initial goal documentation         Plan: - Will collaborate w/ CCM team as above - Will outreach patient/sister for medication management support over the next 4-5 weeks  Catie Darnelle Maffucci, PharmD Clinical Pharmacist [place] [phone]

## 2019-09-11 NOTE — Patient Instructions (Signed)
Visit Information  Goals Addressed            This Visit's Progress     Patient Stated   . PharmD "I'm having a hard time taking care of her" (pt-stated)       Current Barriers:  . Polypharmacy; complex patient with multiple comorbidities including Parkinson's Disease, dementia, HLD, HTN . Unable to manage medications and unable to perform most ADLs at this time. Cared for by her sister, [name] o PD: Current carbidopa/levodopa prescription for 25/100 mg 2 tab at 6 am, 2 at 9 am, 2 at 12 pm, 2 at 3 pm, and 2 at 6 pm; patient's sister has been having to set out pills and calling her from work to remind to take medications, and she still sometimes forgets. Sister notes that they are on the wait list for the PD specialist team at [facility] for a second opinion. 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 Dr. Lannie Fields last note mentions that they may consider addition of quetiapine moving forward if hallucinations continue o HTN: terazosin 1 pm QPM (likely for PTSD/hallucinations use) o HLD: lovastatin 50 mg (1.5 tabs) QPM; last LDL very well controlled <70  Pharmacist Clinical Goal(s):  [name] Over the next 90 days, patient will work with PharmD and provider towards optimized medication management  Interventions: . Comprehensive medication review performed; medication list updated in electronic medical record . Discussed current regimen with sister. She notes that she is becoming quite burnt out, and is considering assisted living placement for patient. However, hesitant to do so now given pandemic and upcoming holidays. Will continue to collaborate w/ RN CM and LCSW for caregiver support.  . Will collaborate w/ PCP and Dr. Melrose Nakayama on optimized medication management moving forward. Consider risk vs benefit of terazosin and side effect of orthostatic hypotension.   Patient Self Care Activities:  . Patient will take medications as prescribed  Initial goal  documentation        The patient verbalized understanding of instructions provided today and declined a print copy of patient instruction materials.   Plan: - Will collaborate w/ CCM team as above - Will outreach patient/sister for medication management support over the next 4-5 weeks  Catie Darnelle Maffucci, PharmD Clinical Pharmacist [place] [phone]

## 2019-09-14 DIAGNOSIS — Z9181 History of falling: Secondary | ICD-10-CM | POA: Diagnosis not present

## 2019-09-14 DIAGNOSIS — G20 Parkinson's disease: Secondary | ICD-10-CM | POA: Diagnosis not present

## 2019-09-14 DIAGNOSIS — Z6823 Body mass index (BMI) 23.0-23.9, adult: Secondary | ICD-10-CM | POA: Diagnosis not present

## 2019-09-14 DIAGNOSIS — I69311 Memory deficit following cerebral infarction: Secondary | ICD-10-CM | POA: Diagnosis not present

## 2019-09-17 DIAGNOSIS — Z6823 Body mass index (BMI) 23.0-23.9, adult: Secondary | ICD-10-CM | POA: Diagnosis not present

## 2019-09-17 DIAGNOSIS — I69311 Memory deficit following cerebral infarction: Secondary | ICD-10-CM | POA: Diagnosis not present

## 2019-09-17 DIAGNOSIS — G20 Parkinson's disease: Secondary | ICD-10-CM | POA: Diagnosis not present

## 2019-09-17 DIAGNOSIS — Z9181 History of falling: Secondary | ICD-10-CM | POA: Diagnosis not present

## 2019-09-18 ENCOUNTER — Ambulatory Visit: Payer: Self-pay | Admitting: *Deleted

## 2019-09-18 DIAGNOSIS — G20 Parkinson's disease: Secondary | ICD-10-CM

## 2019-09-18 NOTE — Chronic Care Management (AMB) (Signed)
  Chronic Care Management   Outreach Note  09/18/2019 Name: Yakeline Grajewski MRN: [id number] DOB: 05-May-1951  Referred by: [name], DO Reason for referral : Chronic Care Management (Unsuccessful Outreach)   An unsuccessful telephone outreach was attempted today, to speak with paitent's caregiver [name]. The patient was referred to the case management team by for assistance with care management and care coordination.   Follow Up Plan: A HIPPA compliant phone message was left for the patient providing contact information and requesting a return call.  The care management team will reach out to the patient again over the next 30 days.    Merlene Morse Gianmarco Roye RN, BSN Nurse Case Editor, commissioning Family Practice/[facility]  [phone]) Business Mobile

## 2019-09-24 DIAGNOSIS — I69311 Memory deficit following cerebral infarction: Secondary | ICD-10-CM | POA: Diagnosis not present

## 2019-09-24 DIAGNOSIS — Z6823 Body mass index (BMI) 23.0-23.9, adult: Secondary | ICD-10-CM | POA: Diagnosis not present

## 2019-09-24 DIAGNOSIS — G20 Parkinson's disease: Secondary | ICD-10-CM | POA: Diagnosis not present

## 2019-09-24 DIAGNOSIS — Z9181 History of falling: Secondary | ICD-10-CM | POA: Diagnosis not present

## 2019-09-30 DIAGNOSIS — Z6823 Body mass index (BMI) 23.0-23.9, adult: Secondary | ICD-10-CM | POA: Diagnosis not present

## 2019-09-30 DIAGNOSIS — Z9181 History of falling: Secondary | ICD-10-CM | POA: Diagnosis not present

## 2019-09-30 DIAGNOSIS — G20 Parkinson's disease: Secondary | ICD-10-CM | POA: Diagnosis not present

## 2019-09-30 DIAGNOSIS — I69311 Memory deficit following cerebral infarction: Secondary | ICD-10-CM | POA: Diagnosis not present

## 2019-10-01 DIAGNOSIS — G20 Parkinson's disease: Secondary | ICD-10-CM | POA: Diagnosis not present

## 2019-10-01 DIAGNOSIS — R200 Anesthesia of skin: Secondary | ICD-10-CM | POA: Diagnosis not present

## 2019-10-01 DIAGNOSIS — G301 Alzheimer's disease with late onset: Secondary | ICD-10-CM | POA: Diagnosis not present

## 2019-10-09 ENCOUNTER — Ambulatory Visit: Payer: Self-pay | Admitting: Pharmacist

## 2019-10-09 ENCOUNTER — Ambulatory Visit (INDEPENDENT_AMBULATORY_CARE_PROVIDER_SITE_OTHER): Payer: Medicare Other | Admitting: *Deleted

## 2019-10-09 DIAGNOSIS — Z7189 Other specified counseling: Secondary | ICD-10-CM

## 2019-10-09 DIAGNOSIS — I10 Essential (primary) hypertension: Secondary | ICD-10-CM

## 2019-10-09 DIAGNOSIS — E782 Mixed hyperlipidemia: Secondary | ICD-10-CM | POA: Diagnosis not present

## 2019-10-09 DIAGNOSIS — G20 Parkinson's disease: Secondary | ICD-10-CM

## 2019-10-09 NOTE — Patient Instructions (Signed)
Visit Information  Goals Addressed            This Visit's Progress     Patient Stated   . PharmD "I'm having a hard time taking care of her" (pt-stated)       Current Barriers:  . Polypharmacy; complex patient with multiple comorbidities including Parkinson's Disease, dementia, HLD, HTN . Unable to manage medications and unable to perform most ADLs at this time. Cared for by her sister, [name] o PD: Current carbidopa/levodopa prescription for 25/100 mg 2 tab at 6 am, 2 at 9 am, 2 at 12 pm, 2 at 3 pm, and 1.5 at 6 pm; Reports big concern w/ freezing; unable to tell if freezing associated w/ off periods or on periods. Feels like freezing episodes are most related to when patient is in new environments or gets scared.  - Hx selegiline - worsened hallucinations - Hx ropinirole - worsened stiffness per sis - Hx Sinemet CR at bedtime - too much next-morning sleepiness o Terazosin 1 mg QPM; Collie Siad reports BP 90s/60s in the mornings; 130-140s later in the evening.  o Dementia: On Nuplazid 34 mg for PD associated dementia with behavioral disturbances - Collie Siad reports that patient sees/hears multiple people in her apartment, causing anxiety and trouble sleeping o HLD: lovastatin 50 mg (1.5 tabs) QPM; last LDL very well controlled <70  Pharmacist Clinical Goal(s):  [name] Over the next 90 days, patient will work with PharmD and provider towards optimized medication management  Interventions: . Comprehensive medication review performed; medication list updated in electronic medical record . Collie Siad notes that they have an appointment w/ [facility] Movement Disorders clinic on 11/01/2019.  [name] Notes that she is worried about restarting quetiapine, due to how sedated it made patient previously. If needed, would recommend starting at lower dose (12.5-25 mg daily)   Patient Self Care Activities:  . Patient will take medications as prescribed  Please see past updates related to this goal by clicking on the "Past  Updates" button in the selected goal         The patient verbalized understanding of instructions provided today and declined a print copy of patient instruction materials.   Plan: - Will outreach sister in ~6-8 weeks for continued medication management support  Catie Darnelle Maffucci, PharmD, [place] [phone]

## 2019-10-09 NOTE — Chronic Care Management (AMB) (Signed)
Chronic Wise Management   Follow Up Note   10/09/2019 Name: Melissa Wise MRN: [id number] DOB: [birth year]  Referred by: Melissa Wise Reason for referral : Chronic Wise Management (ACP docs, Caregiver support)   Melissa Wise is a 68 y.o. year old female who is a primary Wise patient of Melissa Wise. The CCM team was consulted for assistance with chronic disease management and Wise coordination needs.    Review of patient status, including review of consultants reports, relevant laboratory and other test results, and collaboration with appropriate Wise team members and the patient's provider was performed as part of comprehensive patient evaluation and provision of chronic Wise management services.    SDOH (Social Determinants of Health) screening performed today: Advanced Directives. See Wise Plan for related entries.   Outpatient Encounter Medications as of 10/09/2019  Medication Sig Note  . carbidopa-levodopa (SINEMET IR) 25-100 MG tablet  09/11/2019: Taking 5 times daily per Dr. Melrose Wise   . COCONUT OIL PO Take by mouth.   . FIBER ADULT GUMMIES PO Take by mouth.   . lovastatin (MEVACOR) 40 MG tablet TAKE 1 AND 1/2 TABLETS BY MOUTH WITH DINNER   . Multiple Vitamin (MULTIVITAMIN) tablet Take 1 tablet by mouth daily.   . Omega-3 Fatty Acids (FISH OIL) 1000 MG CAPS Take 300 mg by mouth.    . Pimavanserin Tartrate (NUPLAZID) 34 MG CAPS Take by mouth.   . terazosin (HYTRIN) 1 MG capsule Take by mouth.   . TURMERIC PO Take by mouth.    No facility-administered encounter medications on file as of 10/09/2019.      Goals Addressed            This Visit's Progress   . RN- I would like to help Melissa sister get a HCPOA (pt-stated)       Current Barriers:  . Lacks caregiver support.  . Film/video editor.  . Cognitive Deficits . No Advanced Directives in place  Nurse Case Manager Clinical Goal(s):  [name] Over the next 90 days, patient will work with Melissa Wise to address needs  related to patient's need for Advanced Wise planning documents   Interventions:  . Provided education to patient and/or caregiver about advanced directives . Discussed plans with patient for ongoing Wise management follow up and provided patient with direct contact information for Wise management team . Melissa Wise in to pick up written education about POA and Caregiving. . Reviewed with Melissa. Romilda Wise (patient's sister and caregiver) about programs to assist with respite Wise such as Melissa [facility] and Museum/gallery curator . Basic review of HCPOA forms with instructions . Sister discussed upcoming appointment with Movement specialist at [facility] related to patient with "freezing" episodes related to Parkinson's  . Discussed patient's increased hallucinations, up and down blood pressures and possible need for increased hours from [facility].  [name] Encouraged sister to sign up for Melissa Wise- with Melissa Wise to better communicate with providers.  . Sister talked with CCM Pharmacist about medications and SE.    Patient Self Wise Activities:  . Attends all scheduled provider appointments . Unable to perform IADLs independently . No advanced Careplan documents in place  Initial goal documentation         The Wise management team will reach out to the patient again over the next 60 days.  The patient has been provided with contact information for the Wise management team and has been advised to call with any health related questions or concerns.  Melissa Morse Jodell Weitman RN, BSN Nurse Case Editor, commissioning Family Practice/[facility] Wise Management  [phone]) Business Mobile

## 2019-10-09 NOTE — Chronic Care Management (AMB) (Signed)
Chronic Care Management   Follow Up Note   10/09/2019 Name: Melissa Wise MRN: [id number] DOB: [birth year]  Referred by: [name], DO Reason for referral : Chronic Care Management (Medication Management)   Melissa Wise is a 68 y.o. year old female who is a primary care patient of [name], DO. The CCM team was consulted for assistance with chronic disease management and care coordination needs.    Met with patient's sister, Melissa Wise, face to face today.  Review of patient status, including review of consultants reports, relevant laboratory and other test results, and collaboration with appropriate care team members and the patient's provider was performed as part of comprehensive patient evaluation and provision of chronic care management services.    SDOH (Social Determinants of Health) screening performed today: Stress. See Care Plan for related entries.   Outpatient Encounter Medications as of 10/09/2019  Medication Sig Note  . carbidopa-levodopa (SINEMET IR) 25-100 MG tablet  09/11/2019: Taking 5 times daily per Dr. Melrose Nakayama   . COCONUT OIL PO Take by mouth.   . FIBER ADULT GUMMIES PO Take by mouth.   . lovastatin (MEVACOR) 40 MG tablet TAKE 1 AND 1/2 TABLETS BY MOUTH WITH DINNER   . Multiple Vitamin (MULTIVITAMIN) tablet Take 1 tablet by mouth daily.   . Omega-3 Fatty Acids (FISH OIL) 1000 MG CAPS Take 300 mg by mouth.    . Pimavanserin Tartrate (NUPLAZID) 34 MG CAPS Take by mouth.   . terazosin (HYTRIN) 1 MG capsule Take by mouth.   . TURMERIC PO Take by mouth.    No facility-administered encounter medications on file as of 10/09/2019.      Goals Addressed            This Visit's Progress     Patient Stated   . PharmD "I'm having a hard time taking care of her" (pt-stated)       Current Barriers:  . Polypharmacy; complex patient with multiple comorbidities including Parkinson's Disease, dementia, HLD, HTN . Unable to manage medications and unable to  perform most ADLs at this time. Cared for by her sister, Melissa Wise o PD: Current carbidopa/levodopa prescription for 25/100 mg 2 tab at 6 am, 2 at 9 am, 2 at 12 pm, 2 at 3 pm, and 1.5 at 6 pm; Reports big concern w/ freezing; unable to tell if freezing associated w/ off periods or on periods. Feels like freezing episodes are most related to when patient is in new environments or gets scared.  - Hx selegiline - worsened hallucinations - Hx ropinirole - worsened stiffness per sis - Hx Sinemet CR at bedtime - too much next-morning sleepiness o Terazosin 1 mg QPM; Melissa Wise reports BP 90s/60s in the mornings; 130-140s later in the evening.  o Dementia: On Nuplazid 34 mg for PD associated dementia with behavioral disturbances - Melissa Wise reports that patient sees/hears multiple people in her apartment, causing anxiety and trouble sleeping o HLD: lovastatin 50 mg (1.5 tabs) QPM; last LDL very well controlled <70  Pharmacist Clinical Goal(s):  [name] Over the next 90 days, patient will work with PharmD and provider towards optimized medication management  Interventions: . Comprehensive medication review performed; medication list updated in electronic medical record . Melissa Wise notes that they have an appointment w/ [facility] clinic on 11/01/2019.  [name] Notes that she is worried about restarting quetiapine, due to how sedated it made patient previously. If needed, would recommend starting at lower dose (12.5-25 mg daily)   Patient  Self Care Activities:  . Patient will take medications as prescribed  Please see past updates related to this goal by clicking on the "Past Updates" button in the selected goal          Plan: - Will outreach sister in ~6-8 weeks for continued medication management support  Catie Darnelle Maffucci, PharmD, [place] [phone]

## 2019-10-09 NOTE — Patient Instructions (Signed)
Thank you allowing the Chronic Care Management Team to be a part of your care! It was a pleasure speaking with you today!  CCM (Chronic Care Management) Team   Treazure Nery RN, BSN Nurse Care Coordinator  [phone]  [facility] PharmD  Clinical Pharmacist  [phone]  [name] LCSW Clinical Social Worker [phone]  Goals Addressed            This Visit's Progress   . RN- I would like to help my sister get a HCPOA (pt-stated)       Current Barriers:  . Lacks caregiver support.  . Film/video editor.  . Cognitive Deficits . No Advanced Directives in place  Nurse Case Manager Clinical Goal(s):  [name] Over the next 90 days, patient will work with [facility] to address needs related to patient's need for Advanced Care planning documents   Interventions:  . Provided education to patient and/or caregiver about advanced directives . Discussed plans with patient for ongoing care management follow up and provided patient with direct contact information for care management team . [name] in to pick up written education about POA and Caregiving. . Reviewed with Ms. Romilda Garret (patient's sister and caregiver) about programs to assist with respite care such as [employer] and Museum/gallery curator . Basic review of HCPOA forms with instructions . Sister discussed upcoming appointment with Movement specialist at [facility] related to patient with "freezing" episodes related to Parkinson's  . Discussed patient's increased hallucinations, up and down blood pressures and possible need for increased hours from [facility].  [name] Encouraged sister to sign up for MY CHART- with [employer] and [name] to better communicate with providers.  . Sister talked with CCM Pharmacist about medications and SE.    Patient Self Care Activities:  . Attends all scheduled provider appointments . Unable to perform IADLs independently . No advanced Careplan documents in place  Initial goal documentation        The  patient verbalized understanding of instructions provided today and declined a print copy of patient instruction materials.   The patient has been provided with contact information for the care management team and has been advised to call with any health related questions or concerns.

## 2019-10-10 ENCOUNTER — Telehealth: Payer: Self-pay | Admitting: Family Medicine

## 2019-10-10 NOTE — Telephone Encounter (Signed)
Routing to provider  

## 2019-10-10 NOTE — Telephone Encounter (Signed)
Home Health Verbal Orders - Caller/Agency: Brunetta Genera Number: [phone] Requesting Speech Therapy Frequency: 2x's a week for 3 weeks

## 2019-10-10 NOTE — Telephone Encounter (Signed)
OK for verbal orders?

## 2019-10-11 DIAGNOSIS — Z9181 History of falling: Secondary | ICD-10-CM | POA: Diagnosis not present

## 2019-10-11 DIAGNOSIS — Z6823 Body mass index (BMI) 23.0-23.9, adult: Secondary | ICD-10-CM | POA: Diagnosis not present

## 2019-10-11 DIAGNOSIS — I69311 Memory deficit following cerebral infarction: Secondary | ICD-10-CM | POA: Diagnosis not present

## 2019-10-11 DIAGNOSIS — G20 Parkinson's disease: Secondary | ICD-10-CM | POA: Diagnosis not present

## 2019-10-11 NOTE — Telephone Encounter (Signed)
Verbal orders given  

## 2019-10-15 ENCOUNTER — Other Ambulatory Visit: Payer: Self-pay | Admitting: Family Medicine

## 2019-10-15 DIAGNOSIS — G20 Parkinson's disease: Secondary | ICD-10-CM | POA: Diagnosis not present

## 2019-10-15 DIAGNOSIS — I69311 Memory deficit following cerebral infarction: Secondary | ICD-10-CM | POA: Diagnosis not present

## 2019-10-15 DIAGNOSIS — Z6823 Body mass index (BMI) 23.0-23.9, adult: Secondary | ICD-10-CM | POA: Diagnosis not present

## 2019-10-15 DIAGNOSIS — Z9181 History of falling: Secondary | ICD-10-CM | POA: Diagnosis not present

## 2019-10-16 ENCOUNTER — Other Ambulatory Visit: Payer: Self-pay | Admitting: Family Medicine

## 2019-10-16 NOTE — Telephone Encounter (Signed)
Called and was [place] up on X2 .

## 2019-10-16 NOTE — Telephone Encounter (Signed)
Requested medication (s) are due for refill today: yes  Requested medication (s) are on the active medication list: yes  Last refill:  07/16/2019  Future visit scheduled: yes  Notes to clinic:  Medication was discontinued   Requested Prescriptions  Pending Prescriptions Disp Refills   losartan (COZAAR) 25 MG tablet [Pharmacy Med Name: LOSARTAN 25MG  TABLETS] 45 tablet 1    Sig: TAKE 1/2 TABLET(12.5 MG) BY MOUTH DAILY     Cardiovascular:  Angiotensin Receptor Blockers Passed - 10/16/2019  1:37 PM      Passed - Cr in normal range and within 180 days    Creatinine, Ser  Date Value Ref Range Status  07/27/2019 0.80 0.57 - 1.00 mg/dL Final         Passed - K in normal range and within 180 days    Potassium  Date Value Ref Range Status  07/27/2019 4.3 3.5 - 5.2 mmol/L Final  02/02/2012 3.5 3.5 - 5.1 mmol/L Final         Passed - Patient is not pregnant      Passed - Last BP in normal range    BP Readings from Last 1 Encounters:  08/30/19 112/71         Passed - Valid encounter within last 6 months    Recent Outpatient Visits          1 month ago Essential hypertension   [place], Megan P, DO   2 months ago Routine general medical examination at a health care facility   [facility], Megan P, DO   5 months ago Essential hypertension   [place], [place], DO   8 months ago Depression, recurrent [facility])   [place], Megan P, DO   10 months ago Depression, recurrent [facility])   [place], [place], DO      Future Appointments            In 1 month [place], Barb Merino, DO [employer], [place]

## 2019-10-17 ENCOUNTER — Telehealth: Payer: Medicare Other

## 2019-10-19 ENCOUNTER — Telehealth: Payer: Medicare Other

## 2019-10-22 DIAGNOSIS — G20 Parkinson's disease: Secondary | ICD-10-CM | POA: Diagnosis not present

## 2019-10-22 DIAGNOSIS — I69311 Memory deficit following cerebral infarction: Secondary | ICD-10-CM | POA: Diagnosis not present

## 2019-10-22 DIAGNOSIS — Z6823 Body mass index (BMI) 23.0-23.9, adult: Secondary | ICD-10-CM | POA: Diagnosis not present

## 2019-10-22 DIAGNOSIS — Z9181 History of falling: Secondary | ICD-10-CM | POA: Diagnosis not present

## 2019-10-23 NOTE — Telephone Encounter (Signed)
Pt, understood and verbalized understanding.

## 2019-10-25 DIAGNOSIS — Z6823 Body mass index (BMI) 23.0-23.9, adult: Secondary | ICD-10-CM | POA: Diagnosis not present

## 2019-10-25 DIAGNOSIS — G20 Parkinson's disease: Secondary | ICD-10-CM | POA: Diagnosis not present

## 2019-10-25 DIAGNOSIS — I69311 Memory deficit following cerebral infarction: Secondary | ICD-10-CM | POA: Diagnosis not present

## 2019-10-25 DIAGNOSIS — Z9181 History of falling: Secondary | ICD-10-CM | POA: Diagnosis not present

## 2019-10-29 ENCOUNTER — Ambulatory Visit: Payer: Self-pay | Admitting: Licensed Clinical Social Worker

## 2019-10-29 DIAGNOSIS — E782 Mixed hyperlipidemia: Secondary | ICD-10-CM | POA: Diagnosis not present

## 2019-10-29 DIAGNOSIS — F339 Major depressive disorder, recurrent, unspecified: Secondary | ICD-10-CM

## 2019-10-29 DIAGNOSIS — I10 Essential (primary) hypertension: Secondary | ICD-10-CM

## 2019-10-29 DIAGNOSIS — G20 Parkinson's disease: Secondary | ICD-10-CM

## 2019-10-29 NOTE — Chronic Care Management (AMB) (Signed)
Chronic Care Management    Clinical Social Work Follow Up Note  10/29/2019 Name: Melissa Wise MRN: [id number] DOB: [birth year]  Melissa Wise is a 68 y.o. year old female who is a primary care patient of [name], DO. The CCM team was consulted for assistance with Level of Care Concerns.   Review of patient status, including review of consultants reports, other relevant assessments, and collaboration with appropriate care team members and the patient's provider was performed as part of comprehensive patient evaluation and provision of chronic care management services.    SDOH (Social Determinants of Health) screening performed today: Depression   Stress. See Care Plan for related entries.   Advanced Directives Status: <no information> See Care Plan for related entries.   Outpatient Encounter Medications as of 10/29/2019  Medication Sig Note  . carbidopa-levodopa (SINEMET IR) 25-100 MG tablet  09/11/2019: Taking 5 times daily per Dr. Melrose Nakayama   . COCONUT OIL PO Take by mouth.   . FIBER ADULT GUMMIES PO Take by mouth.   . losartan (COZAAR) 25 MG tablet TAKE 1/2 TABLET(12.5 MG) BY MOUTH DAILY   . lovastatin (MEVACOR) 40 MG tablet TAKE 1 TABLET BY MOUTH AT BEDTIME   . Multiple Vitamin (MULTIVITAMIN) tablet Take 1 tablet by mouth daily.   . Omega-3 Fatty Acids (FISH OIL) 1000 MG CAPS Take 300 mg by mouth.    . Pimavanserin Tartrate (NUPLAZID) 34 MG CAPS Take by mouth.   . terazosin (HYTRIN) 1 MG capsule Take by mouth.   . TURMERIC PO Take by mouth.    No facility-administered encounter medications on file as of 10/29/2019.     Goals Addressed    . "We need ALF placement." (pt-stated)       Current Barriers:  [name] Mental Health Concerns  . Cognitive Deficits . Lacks knowledge of community resource: available mental health support resources that accept her insurance  Clinical Social Work Clinical Goal(s):  [name] Over the next 90 days, client will work with SW to address concerns  related to gaining ALF placement.  . Over the next 90 days, client will follow up with mental health management and will assist with resource connection.   Interventions: . Patient interviewed and appropriate assessments performed . Provided mental health counseling in the past with regard to ongoing depression. Patient reports actively implementing appropriate self-care into her routine. Patient shares that she is "staying positive." . Patient's sister reports that patient completed neurologist appointment with Dr. 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 Due to this new diagnosis, family want ALF placement. Patient already has Medicaid and will just need to transition that Medicaid to Special Assistance Medicaid and gain a FL2. Family desire patient to go to [facility] or [facility]. Sister is working on getting patient's last 3 years of documented income.  . Provided patient's family with information about mental health support resources that accept her insurance. LCSW advised patient and family that she has both [facility] AND Medicaid and she has several options that accept both of her insurances. Family was encouraged to utilize and investigating resources that LCSW sent out yet. Resource education provided again.  . Discussed plans with patient for ongoing care management follow up and provided patient with direct contact information for care management team . LCSW provided positive reinforcement for patient's recent implementation of healthy coping tools to combat depressive symptoms during last outreach with patient. . Assisted patient/caregiver with obtaining information about health plan benefits .  Educated patient/family on coping methods to implement into her daily life to combat depressive symptoms and stress.  . Family deny needing Meals on Wheels for now . Family report stable transportation to all medical appointments for patient at  this time.   Patient Self Care Activities:  . Currently UNABLE TO independently find a counselor that accepts her insurance . Attends all scheduled provider appointments . Calls provider office for new concerns or questions  Please see past updates related to this goal by clicking on the "Past Updates" button in the selected goal      Follow Up Plan: SW will follow up with patient by phone over the next 30 days  [name], [place], MSW, [email or web address] Phone: [phone]

## 2019-11-01 DIAGNOSIS — Z6823 Body mass index (BMI) 23.0-23.9, adult: Secondary | ICD-10-CM | POA: Diagnosis not present

## 2019-11-01 DIAGNOSIS — I69311 Memory deficit following cerebral infarction: Secondary | ICD-10-CM | POA: Diagnosis not present

## 2019-11-01 DIAGNOSIS — Z9181 History of falling: Secondary | ICD-10-CM | POA: Diagnosis not present

## 2019-11-01 DIAGNOSIS — G3184 Mild cognitive impairment, so stated: Secondary | ICD-10-CM | POA: Diagnosis not present

## 2019-11-01 DIAGNOSIS — G218 Other secondary parkinsonism: Secondary | ICD-10-CM | POA: Diagnosis not present

## 2019-11-01 DIAGNOSIS — G20 Parkinson's disease: Secondary | ICD-10-CM | POA: Diagnosis not present

## 2019-11-06 ENCOUNTER — Ambulatory Visit: Payer: Self-pay | Admitting: Licensed Clinical Social Worker

## 2019-11-06 DIAGNOSIS — G20 Parkinson's disease: Secondary | ICD-10-CM

## 2019-11-06 DIAGNOSIS — Z9181 History of falling: Secondary | ICD-10-CM | POA: Diagnosis not present

## 2019-11-06 DIAGNOSIS — I10 Essential (primary) hypertension: Secondary | ICD-10-CM

## 2019-11-06 DIAGNOSIS — Z6823 Body mass index (BMI) 23.0-23.9, adult: Secondary | ICD-10-CM | POA: Diagnosis not present

## 2019-11-06 DIAGNOSIS — I69311 Memory deficit following cerebral infarction: Secondary | ICD-10-CM | POA: Diagnosis not present

## 2019-11-06 NOTE — Chronic Care Management (AMB) (Signed)
  Care Management   Follow Up Note   11/06/2019 Name: Melissa Wise MRN: [id number] DOB: 1951-04-15  Referred by: [name], DO Reason for referral : Care Coordination   Melissa Wise is a 68 y.o. year old female who is a primary care patient of [name], DO. The care management team was consulted for assistance with care management and care coordination needs.    Review of patient status, including review of consultants reports, relevant laboratory and other test results, and collaboration with appropriate care team members and the patient's provider was performed as part of comprehensive patient evaluation and provision of chronic care management services.    LCSW received missed call and voice message from patient's sister [name] on 11/06/2019. Sister is requesting FL2 (says the sooner the better on voice message) to be completed now that they celebrated the Christmas holiday. Sister aware of CCM SW appointment tomororw on 11/07/2019. LCSW will sent in basket message to PCP regarding FL2 request. Family is wanting FL2 to be faxed to [facility] at [phone] (Attention Admissions department).   The care management team will reach out to the patient again over the next 1 days.   [name], BSW, MSW, [facility] Care Management [name] Phone: [phone]

## 2019-11-07 ENCOUNTER — Telehealth: Payer: Self-pay

## 2019-11-08 ENCOUNTER — Ambulatory Visit: Payer: Self-pay | Admitting: Licensed Clinical Social Worker

## 2019-11-08 NOTE — Chronic Care Management (AMB) (Signed)
  Care Management   Follow Up Note   11/08/2019 Name: Adlynn Larios MRN: [id number] DOB: [birth year]  Referred by: [name], DO Reason for referral : Care Coordination   Tinsleigh Larance is a 68 y.o. year old female who is a primary care patient of [name], DO. The care management team was consulted for assistance with care management and care coordination needs.    Review of patient status, including review of consultants reports, relevant laboratory and other test results, and collaboration with appropriate care team members and the patient's provider was performed as part of comprehensive patient evaluation and provision of chronic care management services.    LCSW completed CCM outreach attempt today but was unable to reach patient's family successfully. A HIPPA compliant voice message was left encouraging patient to return call once available. LCSW rescheduled CCM SW appointment as well.  LCSW received incoming text message from patient's sister. She was made aware that PCP is out of the office this week. LCSW sent secure message to PCP informing her that family wishes for FL2 to be sent to [facility]. Her email address is [email or web address]  A HIPPA compliant phone message was left for the patient providing contact information and requesting a return call.   [name], BSW, MSW, [facility] Care [facility] [email or web address] Phone: [phone]

## 2019-11-13 ENCOUNTER — Telehealth: Payer: Self-pay

## 2019-11-15 DIAGNOSIS — Z9181 History of falling: Secondary | ICD-10-CM | POA: Diagnosis not present

## 2019-11-15 DIAGNOSIS — I69311 Memory deficit following cerebral infarction: Secondary | ICD-10-CM | POA: Diagnosis not present

## 2019-11-15 DIAGNOSIS — Z6823 Body mass index (BMI) 23.0-23.9, adult: Secondary | ICD-10-CM | POA: Diagnosis not present

## 2019-11-15 DIAGNOSIS — G20 Parkinson's disease: Secondary | ICD-10-CM | POA: Diagnosis not present

## 2019-11-16 DIAGNOSIS — I69311 Memory deficit following cerebral infarction: Secondary | ICD-10-CM | POA: Diagnosis not present

## 2019-11-16 DIAGNOSIS — Z9181 History of falling: Secondary | ICD-10-CM | POA: Diagnosis not present

## 2019-11-16 DIAGNOSIS — Z6823 Body mass index (BMI) 23.0-23.9, adult: Secondary | ICD-10-CM | POA: Diagnosis not present

## 2019-11-16 DIAGNOSIS — G20 Parkinson's disease: Secondary | ICD-10-CM | POA: Diagnosis not present

## 2019-11-19 ENCOUNTER — Ambulatory Visit: Payer: Self-pay | Admitting: Licensed Clinical Social Worker

## 2019-11-19 DIAGNOSIS — I69311 Memory deficit following cerebral infarction: Secondary | ICD-10-CM | POA: Diagnosis not present

## 2019-11-19 DIAGNOSIS — Z9181 History of falling: Secondary | ICD-10-CM | POA: Diagnosis not present

## 2019-11-19 DIAGNOSIS — Z6823 Body mass index (BMI) 23.0-23.9, adult: Secondary | ICD-10-CM | POA: Diagnosis not present

## 2019-11-19 DIAGNOSIS — G20 Parkinson's disease: Secondary | ICD-10-CM | POA: Diagnosis not present

## 2019-11-19 NOTE — Chronic Care Management (AMB) (Signed)
  Care Management   Follow Up Note   11/19/2019 Name: Melissa Wise MRN: [id number] DOB: 11-22-1950  Referred by: [name], DO Reason for referral : No chief complaint on file.   [name] is a 69 y.o. year old female who is a primary care patient of [name], DO. The care management team was consulted for assistance with care management and care coordination needs.    Review of patient status, including review of consultants reports, relevant laboratory and other test results, and collaboration with appropriate care team members and the patient's provider was performed as part of comprehensive patient evaluation and provision of chronic care management services.    LCSW received voice message from patient's daughter from over the weekend. She is asking for an update on requested FL2 as patient needs ALF placement now. She is wanting this FL2 to be faxed over to [facility] and to DSS once completed.   Follow up with provider re: FL2  [name], BSW, MSW, [email or web address] Phone: [phone]

## 2019-11-23 ENCOUNTER — Other Ambulatory Visit: Payer: Self-pay

## 2019-11-23 ENCOUNTER — Encounter: Payer: Self-pay | Admitting: Family Medicine

## 2019-11-23 ENCOUNTER — Ambulatory Visit (INDEPENDENT_AMBULATORY_CARE_PROVIDER_SITE_OTHER): Payer: Medicare Other | Admitting: Family Medicine

## 2019-11-23 ENCOUNTER — Ambulatory Visit: Payer: Self-pay | Admitting: Licensed Clinical Social Worker

## 2019-11-23 VITALS — BP 157/85 | HR 115 | Temp 98.8°F

## 2019-11-23 DIAGNOSIS — Z111 Encounter for screening for respiratory tuberculosis: Secondary | ICD-10-CM

## 2019-11-23 DIAGNOSIS — G20 Parkinson's disease: Secondary | ICD-10-CM | POA: Diagnosis not present

## 2019-11-23 DIAGNOSIS — I10 Essential (primary) hypertension: Secondary | ICD-10-CM

## 2019-11-23 DIAGNOSIS — Z1152 Encounter for screening for COVID-19: Secondary | ICD-10-CM | POA: Diagnosis not present

## 2019-11-23 DIAGNOSIS — G20A1 Parkinson's disease without dyskinesia, without mention of fluctuations: Secondary | ICD-10-CM

## 2019-11-23 NOTE — Assessment & Plan Note (Signed)
Very labile due to parkinsons. Will avoid treating for now as she goes very very low. Continue to monitor.

## 2019-11-23 NOTE — Assessment & Plan Note (Signed)
Needing extra help. To be moving into assisted living. FL2 filled out today. Call with any concerns.

## 2019-11-23 NOTE — Patient Instructions (Signed)
To schedule a COVID test, please  text "COVID" to [phone], OR you can log on to [email or web address] to easily make an on-line appointment. If you do not have access to a smart phone or PC, you can call [phone] to get assistance.  

## 2019-11-23 NOTE — Progress Notes (Signed)
BP (!) 157/85   Pulse (!) 115   Temp 98.8 F (37.1 C) (Oral)   SpO2 100%    Subjective:    Patient ID: Melissa Wise, female    DOB: 10-27-1951, 69 y.o.   MRN: [id number]  HPI: Melissa Wise is a 69 y.o. female  Chief Complaint  Patient presents with  . Paperwork    FL2 form   HYPERTENSION Hypertension status: very labile  Satisfied with current treatment? yes Duration of hypertension: chronic BP monitoring frequency:  a few times a day BP range: 80/50-150s/80s Aspirin: no Recurrent headaches: no Visual changes: no Palpitations: no Dyspnea: no Chest pain: no Lower extremity edema: no Dizzy/lightheaded: yes  Has been having some trouble bathing and cooking and caring for herself. She is planning on moving into assisted living. Needs her FL2 form filled out. Filled out today. See scanned documents.   Relevant past medical, surgical, family and social history reviewed and updated as indicated. Interim medical history since our last visit reviewed. Allergies and medications reviewed and updated.  Review of Systems  Constitutional: Negative.   Respiratory: Negative.   Cardiovascular: Negative.   Gastrointestinal: Negative.   Genitourinary: Negative.   Musculoskeletal: Negative.   Neurological: Positive for tremors, speech difficulty, weakness and light-headedness. Negative for dizziness, seizures, syncope, facial asymmetry, numbness and headaches.  Psychiatric/Behavioral: Negative.     Per HPI unless specifically indicated above     Objective:    BP (!) 157/85   Pulse (!) 115   Temp 98.8 F (37.1 C) (Oral)   SpO2 100%   Wt Readings from Last 3 Encounters:  08/30/19 157 lb (71.2 kg)  04/12/19 155 lb (70.3 kg)  02/08/19 155 lb (70.3 kg)    Physical Exam Vitals and nursing note reviewed.  Constitutional:      General: She is not in acute distress.    Appearance: Normal appearance. She is not ill-appearing, toxic-appearing or diaphoretic.  HENT:   Head: Normocephalic and atraumatic.     Right Ear: External ear normal.     Left Ear: External ear normal.     Nose: Nose normal.     Mouth/Throat:     Mouth: Mucous membranes are moist.     Pharynx: Oropharynx is clear.  Eyes:     General: No scleral icterus.       Right eye: No discharge.        Left eye: No discharge.     Extraocular Movements: Extraocular movements intact.     Conjunctiva/sclera: Conjunctivae normal.     Pupils: Pupils are equal, round, and reactive to light.  Cardiovascular:     Rate and Rhythm: Normal rate and regular rhythm.     Pulses: Normal pulses.     Heart sounds: Normal heart sounds. No murmur. No friction rub. No gallop.   Pulmonary:     Effort: Pulmonary effort is normal. No respiratory distress.     Breath sounds: Normal breath sounds. No stridor. No wheezing, rhonchi or rales.  Chest:     Chest wall: No tenderness.  Musculoskeletal:        General: Normal range of motion.     Cervical back: Normal range of motion and neck supple.  Skin:    General: Skin is warm and dry.     Capillary Refill: Capillary refill takes less than 2 seconds.     Coloration: Skin is not jaundiced or pale.     Findings: No bruising, erythema, lesion or rash.  Neurological:  General: No focal deficit present.     Mental Status: She is alert and oriented to person, place, and time. Mental status is at baseline.  Psychiatric:        Mood and Affect: Mood normal.        Behavior: Behavior normal.        Thought Content: Thought content normal.        Judgment: Judgment normal.     Results for orders placed or performed in visit on 07/27/19  Microscopic Examination   URINE  Result Value Ref Range   WBC, UA None seen 0 - 5 /hpf   RBC 0-2 0 - 2 /hpf   Epithelial Cells (non renal) 0-10 0 - 10 /hpf   Bacteria, UA None seen None seen/Few  CBC with Differential/Platelet  Result Value Ref Range   WBC 6.9 3.4 - 10.8 x10E3/uL   RBC 4.74 3.77 - 5.28 x10E6/uL    Hemoglobin 13.8 11.1 - 15.9 g/dL   Hematocrit 43.2 34.0 - 46.6 %   MCV 91 79 - 97 fL   MCH 29.1 26.6 - 33.0 pg   MCHC 31.9 31.5 - 35.7 g/dL   RDW 13.4 11.7 - 15.4 %   Platelets 185 150 - 450 x10E3/uL   Neutrophils 66 Not Estab. %   Lymphs 27 Not Estab. %   Monocytes 6 Not Estab. %   Eos 1 Not Estab. %   Basos 0 Not Estab. %   Neutrophils Absolute 4.5 1.4 - 7.0 x10E3/uL   Lymphocytes Absolute 1.9 0.7 - 3.1 x10E3/uL   Monocytes Absolute 0.4 0.1 - 0.9 x10E3/uL   EOS (ABSOLUTE) 0.1 0.0 - 0.4 x10E3/uL   Basophils Absolute 0.0 0.0 - 0.2 x10E3/uL   Immature Granulocytes 0 Not Estab. %   Immature Grans (Abs) 0.0 0.0 - 0.1 x10E3/uL  Comprehensive metabolic panel  Result Value Ref Range   Glucose 89 65 - 99 mg/dL   BUN 14 8 - 27 mg/dL   Creatinine, Ser 0.80 0.57 - 1.00 mg/dL   GFR calc non Af Amer 76 >59 mL/min/1.73   GFR calc Af Amer 88 >59 mL/min/1.73   BUN/Creatinine Ratio 18 12 - 28   Sodium 142 134 - 144 mmol/L   Potassium 4.3 3.5 - 5.2 mmol/L   Chloride 100 96 - 106 mmol/L   CO2 24 20 - 29 mmol/L   Calcium 9.8 8.7 - 10.3 mg/dL   Total Protein 7.2 6.0 - 8.5 g/dL   Albumin 5.0 (H) 3.8 - 4.8 g/dL   Globulin, Total 2.2 1.5 - 4.5 g/dL   Albumin/Globulin Ratio 2.3 (H) 1.2 - 2.2   Bilirubin Total 0.3 0.0 - 1.2 mg/dL   Alkaline Phosphatase 65 39 - 117 IU/L   AST 23 0 - 40 IU/L   ALT 7 0 - 32 IU/L  Lipid Panel w/o Chol/HDL Ratio  Result Value Ref Range   Cholesterol, Total 144 100 - 199 mg/dL   Triglycerides 177 (H) 0 - 149 mg/dL   HDL 61 >39 mg/dL   VLDL Cholesterol Cal 29 5 - 40 mg/dL   LDL Chol Calc (NIH) 54 0 - 99 mg/dL  Microalbumin, Urine Waived  Result Value Ref Range   Microalb, Ur Waived 10 0 - 19 mg/L   Creatinine, Urine Waived 10 10 - 300 mg/dL   Microalb/Creat Ratio <30 <30 mg/g  TSH  Result Value Ref Range   TSH 1.260 0.450 - 4.500 uIU/mL  UA/M w/rflx Culture, Routine   Specimen:  Urine   URINE  Result Value Ref Range   Specific Gravity, UA 1.015 1.005 -  1.030   pH, UA 7.0 5.0 - 7.5   Color, UA Yellow Yellow   Appearance Ur Clear Clear   Leukocytes,UA Negative Negative   Protein,UA Negative Negative/Trace   Glucose, UA Negative Negative   Ketones, UA Negative Negative   RBC, UA Trace (A) Negative   Bilirubin, UA Negative Negative   Urobilinogen, Ur 0.2 0.2 - 1.0 mg/dL   Nitrite, UA Negative Negative   Microscopic Examination See below:   B12 and Folate Panel  Result Value Ref Range   Vitamin B-12 698 232 - 1,245 pg/mL   Folate >20.0 >3.0 ng/mL      Assessment & Plan:   Problem List Items Addressed This Visit      Cardiovascular and Mediastinum   Hypertension    Very labile due to parkinsons. Will avoid treating for now as she goes very very low. Continue to monitor.         Nervous and Auditory   Parkinson disease ([place]) - Primary    Needing extra help. To be moving into assisted living. FL2 filled out today. Call with any concerns.        Other Visit Diagnoses    Encounter for screening for COVID-19       Needs labs for ALF- ordered today.   Relevant Orders   SAR CoV2 Serology (COVID 19)AB(IGG)IA   Novel Coronavirus, NAA ([facility])   Screening for tuberculosis       Needs labs for ALF- ordered today.   Relevant Orders   QuantiFERON-TB Gold Plus       Follow up plan: Return in about 3 months (around 02/21/2020).

## 2019-11-23 NOTE — Chronic Care Management (AMB) (Signed)
  Care Management   Follow Up Note   11/23/2019 Name: [name] MRN: [id number] DOB: 01-09-1951  Referred by: [name], DO Reason for referral : No chief complaint on file.   Melissa Wise is a 68 y.o. year old female who is a primary care patient of [name], DO. The care management team was consulted for assistance with care management and care coordination needs.    Review of patient status, including review of consultants reports, relevant laboratory and other test results, and collaboration with appropriate care team members and the patient's provider was performed as part of comprehensive patient evaluation and provision of chronic care management services.    LCSW received incoming call from patient's family member today on 11/23/19. HIPPA verifications received. Family reports that they were able to go to PCP visit yesterday and PCP completed FL2 and sent it over to [facility]. She reports having a copy too.   [name], BSW, MSW, [facility] Care [facility] [email or web address] Phone: [phone]

## 2019-11-26 DIAGNOSIS — Z6823 Body mass index (BMI) 23.0-23.9, adult: Secondary | ICD-10-CM | POA: Diagnosis not present

## 2019-11-26 DIAGNOSIS — G20 Parkinson's disease: Secondary | ICD-10-CM | POA: Diagnosis not present

## 2019-11-26 DIAGNOSIS — Z9181 History of falling: Secondary | ICD-10-CM | POA: Diagnosis not present

## 2019-11-26 DIAGNOSIS — I69311 Memory deficit following cerebral infarction: Secondary | ICD-10-CM | POA: Diagnosis not present

## 2019-11-26 LAB — QUANTIFERON-TB GOLD PLUS
QuantiFERON Mitogen Value: 2.74 IU/mL
QuantiFERON Nil Value: 0.03 IU/mL
QuantiFERON TB1 Ag Value: 0.05 IU/mL
QuantiFERON TB2 Ag Value: 0.04 IU/mL
QuantiFERON-TB Gold Plus: NEGATIVE

## 2019-11-26 LAB — SAR COV2 SEROLOGY (COVID19)AB(IGG),IA: DiaSorin SARS-CoV-2 Ab, IgG: NEGATIVE

## 2019-11-28 ENCOUNTER — Ambulatory Visit: Payer: Self-pay

## 2019-11-28 NOTE — Chronic Care Management (AMB) (Signed)
  Care Management   Follow Up Note   11/28/2019 Name: Melissa Wise MRN: [id number] DOB: [birth year]  Referred by: [name], DO Reason for referral : Care Coordination   Melissa Wise is a 69 y.o. year old female who is a primary care patient of [name], DO. The care management team was consulted for assistance with care management and care coordination needs.    Review of patient status, including review of consultants reports, relevant laboratory and other test results, and collaboration with appropriate care team members and the patient's provider was performed as part of comprehensive patient evaluation and provision of chronic care management services.    LCSW completed CCM outreach attempt today but was unable to reach patient successfully. A HIPPA compliant voice message was left encouraging patient to return call once available. LCSW rescheduled CCM SW appointment as well.  A HIPPA compliant phone message was left for the patient providing contact information and requesting a return call.   [name], BSW, MSW, [facility] Care [facility] [email or web address] Phone: [phone]

## 2019-11-29 DIAGNOSIS — I69311 Memory deficit following cerebral infarction: Secondary | ICD-10-CM | POA: Diagnosis not present

## 2019-11-29 DIAGNOSIS — Z6823 Body mass index (BMI) 23.0-23.9, adult: Secondary | ICD-10-CM | POA: Diagnosis not present

## 2019-11-29 DIAGNOSIS — G20 Parkinson's disease: Secondary | ICD-10-CM | POA: Diagnosis not present

## 2019-11-29 DIAGNOSIS — Z9181 History of falling: Secondary | ICD-10-CM | POA: Diagnosis not present

## 2019-11-30 ENCOUNTER — Ambulatory Visit: Payer: Medicare Other | Admitting: Family Medicine

## 2019-12-03 DIAGNOSIS — Z1152 Encounter for screening for COVID-19: Secondary | ICD-10-CM | POA: Diagnosis not present

## 2019-12-04 DIAGNOSIS — G20 Parkinson's disease: Secondary | ICD-10-CM | POA: Diagnosis not present

## 2019-12-04 DIAGNOSIS — E785 Hyperlipidemia, unspecified: Secondary | ICD-10-CM | POA: Diagnosis not present

## 2019-12-04 DIAGNOSIS — I10 Essential (primary) hypertension: Secondary | ICD-10-CM | POA: Diagnosis not present

## 2019-12-07 DIAGNOSIS — U071 COVID-19: Secondary | ICD-10-CM | POA: Diagnosis not present

## 2019-12-10 DIAGNOSIS — G20 Parkinson's disease: Secondary | ICD-10-CM | POA: Diagnosis not present

## 2019-12-13 DIAGNOSIS — R1312 Dysphagia, oropharyngeal phase: Secondary | ICD-10-CM | POA: Diagnosis not present

## 2019-12-13 DIAGNOSIS — R1310 Dysphagia, unspecified: Secondary | ICD-10-CM | POA: Diagnosis not present

## 2019-12-13 DIAGNOSIS — G20 Parkinson's disease: Secondary | ICD-10-CM | POA: Diagnosis not present

## 2019-12-13 DIAGNOSIS — R2681 Unsteadiness on feet: Secondary | ICD-10-CM | POA: Diagnosis not present

## 2019-12-13 DIAGNOSIS — I10 Essential (primary) hypertension: Secondary | ICD-10-CM | POA: Diagnosis not present

## 2019-12-14 ENCOUNTER — Ambulatory Visit (INDEPENDENT_AMBULATORY_CARE_PROVIDER_SITE_OTHER): Payer: Medicare Other | Admitting: Pharmacist

## 2019-12-14 DIAGNOSIS — Z6823 Body mass index (BMI) 23.0-23.9, adult: Secondary | ICD-10-CM | POA: Diagnosis not present

## 2019-12-14 DIAGNOSIS — I69311 Memory deficit following cerebral infarction: Secondary | ICD-10-CM | POA: Diagnosis not present

## 2019-12-14 DIAGNOSIS — G20 Parkinson's disease: Secondary | ICD-10-CM

## 2019-12-14 DIAGNOSIS — F339 Major depressive disorder, recurrent, unspecified: Secondary | ICD-10-CM

## 2019-12-14 DIAGNOSIS — U071 COVID-19: Secondary | ICD-10-CM | POA: Diagnosis not present

## 2019-12-14 DIAGNOSIS — Z9181 History of falling: Secondary | ICD-10-CM | POA: Diagnosis not present

## 2019-12-14 NOTE — Patient Instructions (Signed)
Visit Information  Goals Addressed            This Visit's Progress     Patient Stated   . COMPLETED: PharmD "I'm having a hard time taking care of her" (pt-stated)       Current Barriers:  . Polypharmacy; complex patient with multiple comorbidities including Parkinson's Disease, dementia, HLD, HTN . Since our last phone call, patient has been placed at [facility] d/t dementia. Difficult transition for Melissa Wise. Has only been able to see ~once per week, and sometimes patient forgets how to use her phone, so there are days she doesn't talk to her. Has an outside visit this afternoon that she is looking forward to.  o PD: Current carbidopa/levodopa prescription for 25/100 mg 2 tab at 6 am, 2 at 9 am, 2 at 12 pm, 2 at 3 pm, and 1.5 at 6 pm; Did see [facility], started on Rivastigmine patch. Currently on 3.4 mg, plans to increase to 9.5 mg when she completes currently supply - Hx selegiline - worsened hallucinations - Hx ropinirole - worsened stiffness per sister - Hx Sinemet CR at bedtime - too much next-morning sleepiness o Terazosin 1 mg QPM; Melissa Wise reports BP 90s/60s in the mornings; 130-140s later in the evening.  o Dementia: Quetiapine 100 mg QPM. Melissa Wise notes that she is concerned that patient is more sedated on this higher dose, but recognizes that is probably best for being in assisted living. Notes the higher dose has made it so that she is less troubled by her hallucinations o HLD: lovastatin 50 mg (1.5 tabs) QPM; last LDL very well controlled <70  Pharmacist Clinical Goal(s):  [name] Over the next 90 days, patient will work with PharmD and provider towards optimized medication management  Interventions: . Reviewed recent medication changes. Reviewed potential side effects of increased rivastigmine dose, including nausea/GI upset. Discussed goal of therapy is to slow progression of dementia, not reverse dementia.  . Provided empathetic listening as she discussed the difficulties of no  longer being solely in control of her sister's health.  . Noted that they may transition to a team called [facility] that would come to the facility to provide primary care. Melissa Wise is looking into coverage w/ insurance. Encouraged her to let us know if they transition patient's care to this other provider team. . Denies any other medication related needs at this time.  Patient Self Care Activities:  . Patient will take medications as prescribed  Please see past updates related to this goal by clicking on the "Past Updates" button in the selected goal         The patient verbalized understanding of instructions provided today and declined a print copy of patient instruction materials.   Plan:  - As patient's medications are now being managed by the facility, I will close pharmacy follow up. Encouraged patient's sister to call with any future needs, and I would be happy to re-engage.  Catie Darnelle Maffucci, PharmD, [place] [phone]

## 2019-12-14 NOTE — Chronic Care Management (AMB) (Signed)
Chronic Care Management   Follow Up Note   12/14/2019 Name: Grisel Travassos MRN: [id number] DOB: 1951/09/29  Referred by: [name], DO Reason for referral : Chronic Care Management (Medication Management)   Melissa Wise is a 69 y.o. year old female who is a primary care patient of [name], DO. The CCM team was consulted for assistance with chronic disease management and care coordination needs.    Contacted patient's sister, Collie Siad, for medication management review.   Review of patient status, including review of consultants reports, relevant laboratory and other test results, and collaboration with appropriate care team members and the patient's provider was performed as part of comprehensive patient evaluation and provision of chronic care management services.    SDOH (Social Determinants of Health) screening performed today: Stress. See Care Plan for related entries.   Outpatient Encounter Medications as of 12/14/2019  Medication Sig Note  . carbidopa-levodopa (SINEMET IR) 25-100 MG tablet  09/11/2019: Taking 5 times daily per Dr. Melrose Nakayama   . QUEtiapine (SEROQUEL) 100 MG tablet Take 100 mg by mouth at bedtime.    . rivastigmine (EXELON) 9.5 mg/24hr 9.5 mg daily. 12/14/2019: Still on 3.4 mg, will up prescription when she completes current supply  . COCONUT OIL PO Take by mouth.   . FIBER ADULT GUMMIES PO Take by mouth.   . lovastatin (MEVACOR) 40 MG tablet TAKE 1 TABLET BY MOUTH AT BEDTIME   . Multiple Vitamin (MULTIVITAMIN) tablet Take 1 tablet by mouth daily.   . Omega-3 Fatty Acids (FISH OIL) 1000 MG CAPS Take 300 mg by mouth.    . terazosin (HYTRIN) 1 MG capsule Take 1 mg by mouth at bedtime.    . TURMERIC PO Take by mouth.   . [DISCONTINUED] rivastigmine (EXELON) 4.6 mg/24hr Place 4.6 mg onto the skin daily.     No facility-administered encounter medications on file as of 12/14/2019.     Objective:   Goals Addressed            This Visit's Progress     Patient Stated   . COMPLETED: PharmD "I'm having a hard time taking care of her" (pt-stated)       Current Barriers:  . Polypharmacy; complex patient with multiple comorbidities including Parkinson's Disease, dementia, HLD, HTN . Since our last phone call, patient has been placed at [facility] d/t dementia. Difficult transition for Collie Siad. Has only been able to see ~once per week, and sometimes patient forgets how to use her phone, so there are days she doesn't talk to her. Has an outside visit this afternoon that she is looking forward to.  o PD: Current carbidopa/levodopa prescription for 25/100 mg 2 tab at 6 am, 2 at 9 am, 2 at 12 pm, 2 at 3 pm, and 1.5 at 6 pm; Did see [facility], started on Rivastigmine patch. Currently on 3.4 mg, plans to increase to 9.5 mg when she completes currently supply - Hx selegiline - worsened hallucinations - Hx ropinirole - worsened stiffness per sister - Hx Sinemet CR at bedtime - too much next-morning sleepiness o Terazosin 1 mg QPM; Collie Siad reports BP 90s/60s in the mornings; 130-140s later in the evening.  o Dementia: Quetiapine 100 mg QPM. Collie Siad notes that she is concerned that patient is more sedated on this higher dose, but recognizes that is probably best for being in assisted living. Notes the higher dose has made it so that she is less troubled by her hallucinations o HLD: lovastatin 50  mg (1.5 tabs) QPM; last LDL very well controlled <70  Pharmacist Clinical Goal(s):  [name] Over the next 90 days, patient will work with PharmD and provider towards optimized medication management  Interventions: . Reviewed recent medication changes. Reviewed potential side effects of increased rivastigmine dose, including nausea/GI upset. Discussed goal of therapy is to slow progression of dementia, not reverse dementia.  . Provided empathetic listening as she discussed the difficulties of no longer being solely in control of her sister's health.  . Noted that they may  transition to a team called [facility] that would come to the facility to provide primary care. Collie Siad is looking into coverage w/ insurance. Encouraged her to let us know if they transition patient's care to this other provider team. . Denies any other medication related needs at this time.  Patient Self Care Activities:  . Patient will take medications as prescribed  Please see past updates related to this goal by clicking on the "Past Updates" button in the selected goal          Plan:  - As patient's medications are now being managed by the facility, I will close pharmacy follow up. Encouraged patient's sister to call with any future needs, and I would be happy to re-engage.  Catie Darnelle Maffucci, PharmD, [place] [phone]

## 2019-12-17 DIAGNOSIS — I10 Essential (primary) hypertension: Secondary | ICD-10-CM | POA: Diagnosis not present

## 2019-12-17 DIAGNOSIS — R1312 Dysphagia, oropharyngeal phase: Secondary | ICD-10-CM | POA: Diagnosis not present

## 2019-12-17 DIAGNOSIS — R2681 Unsteadiness on feet: Secondary | ICD-10-CM | POA: Diagnosis not present

## 2019-12-17 DIAGNOSIS — G20 Parkinson's disease: Secondary | ICD-10-CM | POA: Diagnosis not present

## 2019-12-17 DIAGNOSIS — R1310 Dysphagia, unspecified: Secondary | ICD-10-CM | POA: Diagnosis not present

## 2019-12-18 DIAGNOSIS — R1312 Dysphagia, oropharyngeal phase: Secondary | ICD-10-CM | POA: Diagnosis not present

## 2019-12-18 DIAGNOSIS — G20 Parkinson's disease: Secondary | ICD-10-CM | POA: Diagnosis not present

## 2019-12-18 DIAGNOSIS — R2681 Unsteadiness on feet: Secondary | ICD-10-CM | POA: Diagnosis not present

## 2019-12-18 DIAGNOSIS — R1310 Dysphagia, unspecified: Secondary | ICD-10-CM | POA: Diagnosis not present

## 2019-12-18 DIAGNOSIS — I10 Essential (primary) hypertension: Secondary | ICD-10-CM | POA: Diagnosis not present

## 2019-12-20 DIAGNOSIS — R1310 Dysphagia, unspecified: Secondary | ICD-10-CM | POA: Diagnosis not present

## 2019-12-20 DIAGNOSIS — G20 Parkinson's disease: Secondary | ICD-10-CM | POA: Diagnosis not present

## 2019-12-20 DIAGNOSIS — R1312 Dysphagia, oropharyngeal phase: Secondary | ICD-10-CM | POA: Diagnosis not present

## 2019-12-20 DIAGNOSIS — R2681 Unsteadiness on feet: Secondary | ICD-10-CM | POA: Diagnosis not present

## 2019-12-20 DIAGNOSIS — I10 Essential (primary) hypertension: Secondary | ICD-10-CM | POA: Diagnosis not present

## 2019-12-21 ENCOUNTER — Telehealth: Payer: Self-pay

## 2019-12-21 DIAGNOSIS — U071 COVID-19: Secondary | ICD-10-CM | POA: Diagnosis not present

## 2019-12-24 DIAGNOSIS — G20 Parkinson's disease: Secondary | ICD-10-CM | POA: Diagnosis not present

## 2019-12-24 DIAGNOSIS — R1310 Dysphagia, unspecified: Secondary | ICD-10-CM | POA: Diagnosis not present

## 2019-12-24 DIAGNOSIS — R2681 Unsteadiness on feet: Secondary | ICD-10-CM | POA: Diagnosis not present

## 2019-12-24 DIAGNOSIS — I10 Essential (primary) hypertension: Secondary | ICD-10-CM | POA: Diagnosis not present

## 2019-12-24 DIAGNOSIS — R1312 Dysphagia, oropharyngeal phase: Secondary | ICD-10-CM | POA: Diagnosis not present

## 2019-12-25 DIAGNOSIS — G20 Parkinson's disease: Secondary | ICD-10-CM | POA: Diagnosis not present

## 2019-12-25 DIAGNOSIS — E785 Hyperlipidemia, unspecified: Secondary | ICD-10-CM | POA: Diagnosis not present

## 2019-12-25 DIAGNOSIS — I10 Essential (primary) hypertension: Secondary | ICD-10-CM | POA: Diagnosis not present

## 2019-12-26 DIAGNOSIS — R1310 Dysphagia, unspecified: Secondary | ICD-10-CM | POA: Diagnosis not present

## 2019-12-26 DIAGNOSIS — R2681 Unsteadiness on feet: Secondary | ICD-10-CM | POA: Diagnosis not present

## 2019-12-26 DIAGNOSIS — R1312 Dysphagia, oropharyngeal phase: Secondary | ICD-10-CM | POA: Diagnosis not present

## 2019-12-26 DIAGNOSIS — G20 Parkinson's disease: Secondary | ICD-10-CM | POA: Diagnosis not present

## 2019-12-26 DIAGNOSIS — I10 Essential (primary) hypertension: Secondary | ICD-10-CM | POA: Diagnosis not present

## 2019-12-27 DIAGNOSIS — E7849 Other hyperlipidemia: Secondary | ICD-10-CM | POA: Diagnosis not present

## 2019-12-27 DIAGNOSIS — D518 Other vitamin B12 deficiency anemias: Secondary | ICD-10-CM | POA: Diagnosis not present

## 2019-12-27 DIAGNOSIS — Z79899 Other long term (current) drug therapy: Secondary | ICD-10-CM | POA: Diagnosis not present

## 2019-12-27 DIAGNOSIS — E038 Other specified hypothyroidism: Secondary | ICD-10-CM | POA: Diagnosis not present

## 2019-12-27 DIAGNOSIS — E119 Type 2 diabetes mellitus without complications: Secondary | ICD-10-CM | POA: Diagnosis not present

## 2019-12-27 DIAGNOSIS — E559 Vitamin D deficiency, unspecified: Secondary | ICD-10-CM | POA: Diagnosis not present

## 2019-12-28 DIAGNOSIS — U071 COVID-19: Secondary | ICD-10-CM | POA: Diagnosis not present

## 2019-12-30 DIAGNOSIS — I10 Essential (primary) hypertension: Secondary | ICD-10-CM | POA: Diagnosis not present

## 2019-12-30 DIAGNOSIS — R1310 Dysphagia, unspecified: Secondary | ICD-10-CM | POA: Diagnosis not present

## 2019-12-30 DIAGNOSIS — G20 Parkinson's disease: Secondary | ICD-10-CM | POA: Diagnosis not present

## 2019-12-30 DIAGNOSIS — R1312 Dysphagia, oropharyngeal phase: Secondary | ICD-10-CM | POA: Diagnosis not present

## 2019-12-30 DIAGNOSIS — R2681 Unsteadiness on feet: Secondary | ICD-10-CM | POA: Diagnosis not present

## 2019-12-31 DIAGNOSIS — R1312 Dysphagia, oropharyngeal phase: Secondary | ICD-10-CM | POA: Diagnosis not present

## 2019-12-31 DIAGNOSIS — R1310 Dysphagia, unspecified: Secondary | ICD-10-CM | POA: Diagnosis not present

## 2019-12-31 DIAGNOSIS — I10 Essential (primary) hypertension: Secondary | ICD-10-CM | POA: Diagnosis not present

## 2019-12-31 DIAGNOSIS — G20 Parkinson's disease: Secondary | ICD-10-CM | POA: Diagnosis not present

## 2019-12-31 DIAGNOSIS — R2681 Unsteadiness on feet: Secondary | ICD-10-CM | POA: Diagnosis not present

## 2020-01-04 DIAGNOSIS — U071 COVID-19: Secondary | ICD-10-CM | POA: Diagnosis not present

## 2020-01-04 DIAGNOSIS — N390 Urinary tract infection, site not specified: Secondary | ICD-10-CM | POA: Diagnosis not present

## 2020-01-04 DIAGNOSIS — G20 Parkinson's disease: Secondary | ICD-10-CM | POA: Diagnosis not present

## 2020-01-09 DIAGNOSIS — G20 Parkinson's disease: Secondary | ICD-10-CM | POA: Diagnosis not present

## 2020-01-09 DIAGNOSIS — I10 Essential (primary) hypertension: Secondary | ICD-10-CM | POA: Diagnosis not present

## 2020-01-09 DIAGNOSIS — R1310 Dysphagia, unspecified: Secondary | ICD-10-CM | POA: Diagnosis not present

## 2020-01-09 DIAGNOSIS — R2681 Unsteadiness on feet: Secondary | ICD-10-CM | POA: Diagnosis not present

## 2020-01-09 DIAGNOSIS — R1312 Dysphagia, oropharyngeal phase: Secondary | ICD-10-CM | POA: Diagnosis not present

## 2020-01-11 DIAGNOSIS — R2681 Unsteadiness on feet: Secondary | ICD-10-CM | POA: Diagnosis not present

## 2020-01-11 DIAGNOSIS — R1312 Dysphagia, oropharyngeal phase: Secondary | ICD-10-CM | POA: Diagnosis not present

## 2020-01-11 DIAGNOSIS — G20 Parkinson's disease: Secondary | ICD-10-CM | POA: Diagnosis not present

## 2020-01-11 DIAGNOSIS — R1310 Dysphagia, unspecified: Secondary | ICD-10-CM | POA: Diagnosis not present

## 2020-01-11 DIAGNOSIS — I10 Essential (primary) hypertension: Secondary | ICD-10-CM | POA: Diagnosis not present

## 2020-01-11 DIAGNOSIS — U071 COVID-19: Secondary | ICD-10-CM | POA: Diagnosis not present

## 2020-01-15 DIAGNOSIS — I10 Essential (primary) hypertension: Secondary | ICD-10-CM | POA: Diagnosis not present

## 2020-01-15 DIAGNOSIS — G20 Parkinson's disease: Secondary | ICD-10-CM | POA: Diagnosis not present

## 2020-01-15 DIAGNOSIS — R2681 Unsteadiness on feet: Secondary | ICD-10-CM | POA: Diagnosis not present

## 2020-01-15 DIAGNOSIS — R1310 Dysphagia, unspecified: Secondary | ICD-10-CM | POA: Diagnosis not present

## 2020-01-15 DIAGNOSIS — R1312 Dysphagia, oropharyngeal phase: Secondary | ICD-10-CM | POA: Diagnosis not present

## 2020-01-16 DIAGNOSIS — R2681 Unsteadiness on feet: Secondary | ICD-10-CM | POA: Diagnosis not present

## 2020-01-16 DIAGNOSIS — G20 Parkinson's disease: Secondary | ICD-10-CM | POA: Diagnosis not present

## 2020-01-16 DIAGNOSIS — R1310 Dysphagia, unspecified: Secondary | ICD-10-CM | POA: Diagnosis not present

## 2020-01-16 DIAGNOSIS — I10 Essential (primary) hypertension: Secondary | ICD-10-CM | POA: Diagnosis not present

## 2020-01-18 DIAGNOSIS — G20 Parkinson's disease: Secondary | ICD-10-CM | POA: Diagnosis not present

## 2020-01-18 DIAGNOSIS — I10 Essential (primary) hypertension: Secondary | ICD-10-CM | POA: Diagnosis not present

## 2020-01-18 DIAGNOSIS — R2681 Unsteadiness on feet: Secondary | ICD-10-CM | POA: Diagnosis not present

## 2020-01-18 DIAGNOSIS — U071 COVID-19: Secondary | ICD-10-CM | POA: Diagnosis not present

## 2020-01-18 DIAGNOSIS — R1310 Dysphagia, unspecified: Secondary | ICD-10-CM | POA: Diagnosis not present

## 2020-01-18 DIAGNOSIS — R1312 Dysphagia, oropharyngeal phase: Secondary | ICD-10-CM | POA: Diagnosis not present

## 2020-01-21 ENCOUNTER — Ambulatory Visit (INDEPENDENT_AMBULATORY_CARE_PROVIDER_SITE_OTHER): Payer: Medicare Other | Admitting: Licensed Clinical Social Worker

## 2020-01-21 DIAGNOSIS — F339 Major depressive disorder, recurrent, unspecified: Secondary | ICD-10-CM

## 2020-01-21 DIAGNOSIS — E782 Mixed hyperlipidemia: Secondary | ICD-10-CM | POA: Diagnosis not present

## 2020-01-21 DIAGNOSIS — G20 Parkinson's disease: Secondary | ICD-10-CM

## 2020-01-21 NOTE — Chronic Care Management (AMB) (Signed)
Chronic Care Management    Clinical Social Work Follow Up Note  01/21/2020 Name: Melissa Wise MRN: [id number] DOB: 1951/10/15  Melissa Wise is a 69 y.o. year old female who is a primary care patient of [name], DO. The CCM team was consulted for assistance with Level of Care Concerns.   Review of patient status, including review of consultants reports, other relevant assessments, and collaboration with appropriate care team members and the patient's provider was performed as part of comprehensive patient evaluation and provision of chronic care management services.    SDOH (Social Determinants of Health) assessments performed: Yes    Outpatient Encounter Medications as of 01/21/2020  Medication Sig Note  . carbidopa-levodopa (SINEMET IR) 25-100 MG tablet  09/11/2019: Taking 5 times daily per Dr. Melrose Nakayama   . COCONUT OIL PO Take by mouth.   . FIBER ADULT GUMMIES PO Take by mouth.   . lovastatin (MEVACOR) 40 MG tablet TAKE 1 TABLET BY MOUTH AT BEDTIME   . Multiple Vitamin (MULTIVITAMIN) tablet Take 1 tablet by mouth daily.   . Omega-3 Fatty Acids (FISH OIL) 1000 MG CAPS Take 300 mg by mouth.    . QUEtiapine (SEROQUEL) 100 MG tablet Take 100 mg by mouth at bedtime.    . rivastigmine (EXELON) 9.5 mg/24hr 9.5 mg daily. 12/14/2019: Still on 3.4 mg, will up prescription when she completes current supply  . terazosin (HYTRIN) 1 MG capsule Take 1 mg by mouth at bedtime.    . TURMERIC PO Take by mouth.    No facility-administered encounter medications on file as of 01/21/2020.     Goals Addressed    . "We need ongoing support" (pt-stated)       Current Barriers:  [name] Mental Health Concerns -ongoing depression . Cognitive Deficits . Lacks knowledge of community resource: available mental health support resources that accept her insurance  Clinical Social Work Clinical Goal(s):  [name] Over the next 90 days, client will work with SW to address concerns related to gaining appropriate support  care within the home  . Over the next 90 days, client will follow up with mental health management and will assist with resource connection.   Interventions: . Patient interviewed and appropriate assessments performed . Provided mental health counseling in the past with regard to ongoing depression. Patient reports actively implementing appropriate self-care into her routine. Patient shares that she is "staying positive." . Provided patient's family with information about mental health support resources that accept her insurance. LCSW advised patient and family that she has both [facility] AND Medicaid and she has several options that accept both of her insurances. Family was encouraged to utilize and investigating resources that LCSW sent out yet. Resource education provided again.  . Discussed plans with patient for ongoing care management follow up and provided patient with direct contact information for care management team . LCSW provided positive reinforcement for patient's recent implementation of healthy coping tools to combat depressive symptoms during last outreach with patient. . Assisted patient/caregiver with obtaining information about health plan benefits . Educated patient/family on coping methods to implement into her daily life to combat depressive symptoms and stress.  . Family deny needing Meals on Wheels for now . Emotional support and motivational interviewing intervention implemented during session as patient continues to struggle with depressive symptoms . Family report stable transportation to all medical appointments for patient at this time.  . Patient's aide reports that she stays with patient 9-12 per day. She shares that patient is  doing well and her mobility has increased. Aide denies any urgent concerns at this time.  Patient Self Care Activities:  . Attends all scheduled provider appointments . Calls provider office for new concerns or questions  Please see past updates  related to this goal by clicking on the "Past Updates" button in the selected goal      Follow Up Plan: Embedded care coordination team will continue to follow patient progress and assist with care coordination needs.  [name], BSW, MSW, [facility] [email or web address] Phone: [phone]

## 2020-01-22 ENCOUNTER — Other Ambulatory Visit: Payer: Self-pay | Admitting: Family Medicine

## 2020-01-22 NOTE — Telephone Encounter (Signed)
Patient last seen 11/23/19 and has f/up 02/21/20

## 2020-01-22 NOTE — Telephone Encounter (Signed)
Requested medication (s) are due for refill today: Yes  Requested medication (s) are on the active medication list: Yes  Last refill:  07/27/19  Future visit scheduled: Yes  Notes to clinic:  Historical provider.    Requested Prescriptions  Pending Prescriptions Disp Refills   terazosin (HYTRIN) 1 MG capsule [Pharmacy Med Name: TERAZOSIN 1 MG CAPSULE] 13 capsule 10    Sig: TAKE 1 CAPSULE BY MOUTH ONCE DAILY AT BEDTIME      Cardiovascular:  Alpha Blockers Failed - 01/22/2020  1:21 PM      Failed - Last BP in normal range    BP Readings from Last 1 Encounters:  11/23/19 (!) 157/85          Passed - Valid encounter within last 6 months    Recent Outpatient Visits           2 months ago Parkinson disease ([place])   [place], Megan P, DO   4 months ago Essential hypertension   [place], Megan P, DO   5 months ago Routine general medical examination at a health care facility   [facility], [place], DO   9 months ago Essential hypertension   [place], Megan P, DO   11 months ago Depression, recurrent [facility])   [place], [place], DO       Future Appointments             In 1 month Johnson, Barb Merino, DO [employer]

## 2020-01-22 NOTE — Telephone Encounter (Signed)
I don't write that.

## 2020-01-23 DIAGNOSIS — R296 Repeated falls: Secondary | ICD-10-CM | POA: Diagnosis not present

## 2020-01-23 DIAGNOSIS — G20 Parkinson's disease: Secondary | ICD-10-CM | POA: Diagnosis not present

## 2020-01-23 DIAGNOSIS — I10 Essential (primary) hypertension: Secondary | ICD-10-CM | POA: Diagnosis not present

## 2020-01-24 DIAGNOSIS — R1312 Dysphagia, oropharyngeal phase: Secondary | ICD-10-CM | POA: Diagnosis not present

## 2020-01-24 DIAGNOSIS — I10 Essential (primary) hypertension: Secondary | ICD-10-CM | POA: Diagnosis not present

## 2020-01-24 DIAGNOSIS — R1310 Dysphagia, unspecified: Secondary | ICD-10-CM | POA: Diagnosis not present

## 2020-01-24 DIAGNOSIS — G20 Parkinson's disease: Secondary | ICD-10-CM | POA: Diagnosis not present

## 2020-01-24 DIAGNOSIS — R2681 Unsteadiness on feet: Secondary | ICD-10-CM | POA: Diagnosis not present

## 2020-01-25 DIAGNOSIS — U071 COVID-19: Secondary | ICD-10-CM | POA: Diagnosis not present

## 2020-01-29 DIAGNOSIS — G20 Parkinson's disease: Secondary | ICD-10-CM | POA: Diagnosis not present

## 2020-01-29 DIAGNOSIS — R2681 Unsteadiness on feet: Secondary | ICD-10-CM | POA: Diagnosis not present

## 2020-01-29 DIAGNOSIS — R1310 Dysphagia, unspecified: Secondary | ICD-10-CM | POA: Diagnosis not present

## 2020-01-29 DIAGNOSIS — I10 Essential (primary) hypertension: Secondary | ICD-10-CM | POA: Diagnosis not present

## 2020-01-29 DIAGNOSIS — R1312 Dysphagia, oropharyngeal phase: Secondary | ICD-10-CM | POA: Diagnosis not present

## 2020-02-04 DIAGNOSIS — R2681 Unsteadiness on feet: Secondary | ICD-10-CM | POA: Diagnosis not present

## 2020-02-04 DIAGNOSIS — I10 Essential (primary) hypertension: Secondary | ICD-10-CM | POA: Diagnosis not present

## 2020-02-04 DIAGNOSIS — G20 Parkinson's disease: Secondary | ICD-10-CM | POA: Diagnosis not present

## 2020-02-04 DIAGNOSIS — R1312 Dysphagia, oropharyngeal phase: Secondary | ICD-10-CM | POA: Diagnosis not present

## 2020-02-04 DIAGNOSIS — R1310 Dysphagia, unspecified: Secondary | ICD-10-CM | POA: Diagnosis not present

## 2020-02-19 DIAGNOSIS — R296 Repeated falls: Secondary | ICD-10-CM | POA: Diagnosis not present

## 2020-02-19 DIAGNOSIS — G20 Parkinson's disease: Secondary | ICD-10-CM | POA: Diagnosis not present

## 2020-02-20 DIAGNOSIS — E119 Type 2 diabetes mellitus without complications: Secondary | ICD-10-CM | POA: Diagnosis not present

## 2020-02-20 DIAGNOSIS — E038 Other specified hypothyroidism: Secondary | ICD-10-CM | POA: Diagnosis not present

## 2020-02-20 DIAGNOSIS — D518 Other vitamin B12 deficiency anemias: Secondary | ICD-10-CM | POA: Diagnosis not present

## 2020-02-20 DIAGNOSIS — E559 Vitamin D deficiency, unspecified: Secondary | ICD-10-CM | POA: Diagnosis not present

## 2020-02-21 ENCOUNTER — Ambulatory Visit: Payer: Medicare Other | Admitting: Family Medicine

## 2020-03-13 DIAGNOSIS — B351 Tinea unguium: Secondary | ICD-10-CM | POA: Diagnosis not present

## 2020-03-17 DIAGNOSIS — E559 Vitamin D deficiency, unspecified: Secondary | ICD-10-CM | POA: Diagnosis not present

## 2020-03-17 DIAGNOSIS — D518 Other vitamin B12 deficiency anemias: Secondary | ICD-10-CM | POA: Diagnosis not present

## 2020-03-17 DIAGNOSIS — E119 Type 2 diabetes mellitus without complications: Secondary | ICD-10-CM | POA: Diagnosis not present

## 2020-03-17 DIAGNOSIS — E038 Other specified hypothyroidism: Secondary | ICD-10-CM | POA: Diagnosis not present

## 2020-03-18 DIAGNOSIS — I10 Essential (primary) hypertension: Secondary | ICD-10-CM | POA: Diagnosis not present

## 2020-03-18 DIAGNOSIS — R296 Repeated falls: Secondary | ICD-10-CM | POA: Diagnosis not present

## 2020-03-27 DIAGNOSIS — R200 Anesthesia of skin: Secondary | ICD-10-CM | POA: Diagnosis not present

## 2020-03-27 DIAGNOSIS — G231 Progressive supranuclear ophthalmoplegia [Steele-Richardson-Olszewski]: Secondary | ICD-10-CM | POA: Diagnosis not present

## 2020-03-27 DIAGNOSIS — G301 Alzheimer's disease with late onset: Secondary | ICD-10-CM | POA: Diagnosis not present

## 2020-04-14 DIAGNOSIS — E119 Type 2 diabetes mellitus without complications: Secondary | ICD-10-CM | POA: Diagnosis not present

## 2020-04-14 DIAGNOSIS — D518 Other vitamin B12 deficiency anemias: Secondary | ICD-10-CM | POA: Diagnosis not present

## 2020-04-14 DIAGNOSIS — E559 Vitamin D deficiency, unspecified: Secondary | ICD-10-CM | POA: Diagnosis not present

## 2020-04-14 DIAGNOSIS — E038 Other specified hypothyroidism: Secondary | ICD-10-CM | POA: Diagnosis not present

## 2020-04-14 DIAGNOSIS — I10 Essential (primary) hypertension: Secondary | ICD-10-CM | POA: Diagnosis not present

## 2020-04-15 DIAGNOSIS — H2511 Age-related nuclear cataract, right eye: Secondary | ICD-10-CM | POA: Diagnosis not present

## 2020-04-15 DIAGNOSIS — H5711 Ocular pain, right eye: Secondary | ICD-10-CM | POA: Diagnosis not present

## 2020-04-29 DIAGNOSIS — E785 Hyperlipidemia, unspecified: Secondary | ICD-10-CM | POA: Diagnosis not present

## 2020-04-29 DIAGNOSIS — R4702 Dysphasia: Secondary | ICD-10-CM | POA: Diagnosis not present

## 2020-04-29 DIAGNOSIS — G20 Parkinson's disease: Secondary | ICD-10-CM | POA: Diagnosis not present

## 2020-05-19 DIAGNOSIS — E038 Other specified hypothyroidism: Secondary | ICD-10-CM | POA: Diagnosis not present

## 2020-05-19 DIAGNOSIS — D518 Other vitamin B12 deficiency anemias: Secondary | ICD-10-CM | POA: Diagnosis not present

## 2020-05-19 DIAGNOSIS — E119 Type 2 diabetes mellitus without complications: Secondary | ICD-10-CM | POA: Diagnosis not present

## 2020-05-19 DIAGNOSIS — E559 Vitamin D deficiency, unspecified: Secondary | ICD-10-CM | POA: Diagnosis not present

## 2020-05-19 DIAGNOSIS — I10 Essential (primary) hypertension: Secondary | ICD-10-CM | POA: Diagnosis not present

## 2020-05-20 DIAGNOSIS — I10 Essential (primary) hypertension: Secondary | ICD-10-CM | POA: Diagnosis not present

## 2020-05-20 DIAGNOSIS — E785 Hyperlipidemia, unspecified: Secondary | ICD-10-CM | POA: Diagnosis not present

## 2020-05-20 DIAGNOSIS — G20 Parkinson's disease: Secondary | ICD-10-CM | POA: Diagnosis not present

## 2020-05-22 DIAGNOSIS — G301 Alzheimer's disease with late onset: Secondary | ICD-10-CM | POA: Diagnosis not present

## 2020-05-22 DIAGNOSIS — R200 Anesthesia of skin: Secondary | ICD-10-CM | POA: Diagnosis not present

## 2020-05-22 DIAGNOSIS — G231 Progressive supranuclear ophthalmoplegia [Steele-Richardson-Olszewski]: Secondary | ICD-10-CM | POA: Diagnosis not present

## 2020-05-26 DIAGNOSIS — R1310 Dysphagia, unspecified: Secondary | ICD-10-CM | POA: Diagnosis not present

## 2020-05-26 DIAGNOSIS — R4702 Dysphasia: Secondary | ICD-10-CM | POA: Diagnosis not present

## 2020-05-26 DIAGNOSIS — G20 Parkinson's disease: Secondary | ICD-10-CM | POA: Diagnosis not present

## 2020-05-26 DIAGNOSIS — R2689 Other abnormalities of gait and mobility: Secondary | ICD-10-CM | POA: Diagnosis not present

## 2020-05-26 DIAGNOSIS — M6281 Muscle weakness (generalized): Secondary | ICD-10-CM | POA: Diagnosis not present

## 2020-05-28 DIAGNOSIS — R1310 Dysphagia, unspecified: Secondary | ICD-10-CM | POA: Diagnosis not present

## 2020-05-28 DIAGNOSIS — R4702 Dysphasia: Secondary | ICD-10-CM | POA: Diagnosis not present

## 2020-05-28 DIAGNOSIS — R2689 Other abnormalities of gait and mobility: Secondary | ICD-10-CM | POA: Diagnosis not present

## 2020-05-28 DIAGNOSIS — M6281 Muscle weakness (generalized): Secondary | ICD-10-CM | POA: Diagnosis not present

## 2020-05-28 DIAGNOSIS — G20 Parkinson's disease: Secondary | ICD-10-CM | POA: Diagnosis not present

## 2020-05-29 DIAGNOSIS — M6281 Muscle weakness (generalized): Secondary | ICD-10-CM | POA: Diagnosis not present

## 2020-05-29 DIAGNOSIS — R2689 Other abnormalities of gait and mobility: Secondary | ICD-10-CM | POA: Diagnosis not present

## 2020-05-29 DIAGNOSIS — G20 Parkinson's disease: Secondary | ICD-10-CM | POA: Diagnosis not present

## 2020-05-29 DIAGNOSIS — R1310 Dysphagia, unspecified: Secondary | ICD-10-CM | POA: Diagnosis not present

## 2020-05-29 DIAGNOSIS — R4702 Dysphasia: Secondary | ICD-10-CM | POA: Diagnosis not present

## 2020-06-02 DIAGNOSIS — R4702 Dysphasia: Secondary | ICD-10-CM | POA: Diagnosis not present

## 2020-06-02 DIAGNOSIS — R2689 Other abnormalities of gait and mobility: Secondary | ICD-10-CM | POA: Diagnosis not present

## 2020-06-02 DIAGNOSIS — G20 Parkinson's disease: Secondary | ICD-10-CM | POA: Diagnosis not present

## 2020-06-02 DIAGNOSIS — M6281 Muscle weakness (generalized): Secondary | ICD-10-CM | POA: Diagnosis not present

## 2020-06-02 DIAGNOSIS — R1310 Dysphagia, unspecified: Secondary | ICD-10-CM | POA: Diagnosis not present

## 2020-06-03 DIAGNOSIS — G20 Parkinson's disease: Secondary | ICD-10-CM | POA: Diagnosis not present

## 2020-06-03 DIAGNOSIS — M6281 Muscle weakness (generalized): Secondary | ICD-10-CM | POA: Diagnosis not present

## 2020-06-03 DIAGNOSIS — R4702 Dysphasia: Secondary | ICD-10-CM | POA: Diagnosis not present

## 2020-06-03 DIAGNOSIS — R1310 Dysphagia, unspecified: Secondary | ICD-10-CM | POA: Diagnosis not present

## 2020-06-03 DIAGNOSIS — R2689 Other abnormalities of gait and mobility: Secondary | ICD-10-CM | POA: Diagnosis not present

## 2020-06-04 DIAGNOSIS — G20 Parkinson's disease: Secondary | ICD-10-CM | POA: Diagnosis not present

## 2020-06-04 DIAGNOSIS — R1310 Dysphagia, unspecified: Secondary | ICD-10-CM | POA: Diagnosis not present

## 2020-06-04 DIAGNOSIS — R4702 Dysphasia: Secondary | ICD-10-CM | POA: Diagnosis not present

## 2020-06-04 DIAGNOSIS — R2689 Other abnormalities of gait and mobility: Secondary | ICD-10-CM | POA: Diagnosis not present

## 2020-06-04 DIAGNOSIS — M6281 Muscle weakness (generalized): Secondary | ICD-10-CM | POA: Diagnosis not present

## 2020-06-05 DIAGNOSIS — R1310 Dysphagia, unspecified: Secondary | ICD-10-CM | POA: Diagnosis not present

## 2020-06-05 DIAGNOSIS — R4702 Dysphasia: Secondary | ICD-10-CM | POA: Diagnosis not present

## 2020-06-05 DIAGNOSIS — R2689 Other abnormalities of gait and mobility: Secondary | ICD-10-CM | POA: Diagnosis not present

## 2020-06-05 DIAGNOSIS — M6281 Muscle weakness (generalized): Secondary | ICD-10-CM | POA: Diagnosis not present

## 2020-06-05 DIAGNOSIS — G20 Parkinson's disease: Secondary | ICD-10-CM | POA: Diagnosis not present

## 2020-06-06 DIAGNOSIS — R4702 Dysphasia: Secondary | ICD-10-CM | POA: Diagnosis not present

## 2020-06-06 DIAGNOSIS — R1310 Dysphagia, unspecified: Secondary | ICD-10-CM | POA: Diagnosis not present

## 2020-06-06 DIAGNOSIS — G20 Parkinson's disease: Secondary | ICD-10-CM | POA: Diagnosis not present

## 2020-06-06 DIAGNOSIS — R2689 Other abnormalities of gait and mobility: Secondary | ICD-10-CM | POA: Diagnosis not present

## 2020-06-06 DIAGNOSIS — M6281 Muscle weakness (generalized): Secondary | ICD-10-CM | POA: Diagnosis not present

## 2020-06-09 DIAGNOSIS — R4702 Dysphasia: Secondary | ICD-10-CM | POA: Diagnosis not present

## 2020-06-09 DIAGNOSIS — R1310 Dysphagia, unspecified: Secondary | ICD-10-CM | POA: Diagnosis not present

## 2020-06-09 DIAGNOSIS — M6281 Muscle weakness (generalized): Secondary | ICD-10-CM | POA: Diagnosis not present

## 2020-06-09 DIAGNOSIS — R2689 Other abnormalities of gait and mobility: Secondary | ICD-10-CM | POA: Diagnosis not present

## 2020-06-09 DIAGNOSIS — G20 Parkinson's disease: Secondary | ICD-10-CM | POA: Diagnosis not present

## 2020-06-10 DIAGNOSIS — M6281 Muscle weakness (generalized): Secondary | ICD-10-CM | POA: Diagnosis not present

## 2020-06-10 DIAGNOSIS — R2689 Other abnormalities of gait and mobility: Secondary | ICD-10-CM | POA: Diagnosis not present

## 2020-06-10 DIAGNOSIS — G20 Parkinson's disease: Secondary | ICD-10-CM | POA: Diagnosis not present

## 2020-06-10 DIAGNOSIS — R4702 Dysphasia: Secondary | ICD-10-CM | POA: Diagnosis not present

## 2020-06-10 DIAGNOSIS — R1310 Dysphagia, unspecified: Secondary | ICD-10-CM | POA: Diagnosis not present

## 2020-06-11 DIAGNOSIS — R1310 Dysphagia, unspecified: Secondary | ICD-10-CM | POA: Diagnosis not present

## 2020-06-11 DIAGNOSIS — G20 Parkinson's disease: Secondary | ICD-10-CM | POA: Diagnosis not present

## 2020-06-11 DIAGNOSIS — M6281 Muscle weakness (generalized): Secondary | ICD-10-CM | POA: Diagnosis not present

## 2020-06-11 DIAGNOSIS — R4702 Dysphasia: Secondary | ICD-10-CM | POA: Diagnosis not present

## 2020-06-11 DIAGNOSIS — R2689 Other abnormalities of gait and mobility: Secondary | ICD-10-CM | POA: Diagnosis not present

## 2020-06-13 DIAGNOSIS — R1310 Dysphagia, unspecified: Secondary | ICD-10-CM | POA: Diagnosis not present

## 2020-06-13 DIAGNOSIS — R2689 Other abnormalities of gait and mobility: Secondary | ICD-10-CM | POA: Diagnosis not present

## 2020-06-13 DIAGNOSIS — R4702 Dysphasia: Secondary | ICD-10-CM | POA: Diagnosis not present

## 2020-06-13 DIAGNOSIS — M6281 Muscle weakness (generalized): Secondary | ICD-10-CM | POA: Diagnosis not present

## 2020-06-13 DIAGNOSIS — G20 Parkinson's disease: Secondary | ICD-10-CM | POA: Diagnosis not present

## 2020-06-17 DIAGNOSIS — G20 Parkinson's disease: Secondary | ICD-10-CM | POA: Diagnosis not present

## 2020-06-17 DIAGNOSIS — R2689 Other abnormalities of gait and mobility: Secondary | ICD-10-CM | POA: Diagnosis not present

## 2020-06-17 DIAGNOSIS — I10 Essential (primary) hypertension: Secondary | ICD-10-CM | POA: Diagnosis not present

## 2020-06-17 DIAGNOSIS — E559 Vitamin D deficiency, unspecified: Secondary | ICD-10-CM | POA: Diagnosis not present

## 2020-06-17 DIAGNOSIS — E119 Type 2 diabetes mellitus without complications: Secondary | ICD-10-CM | POA: Diagnosis not present

## 2020-06-17 DIAGNOSIS — R1310 Dysphagia, unspecified: Secondary | ICD-10-CM | POA: Diagnosis not present

## 2020-06-17 DIAGNOSIS — E7849 Other hyperlipidemia: Secondary | ICD-10-CM | POA: Diagnosis not present

## 2020-06-17 DIAGNOSIS — R4702 Dysphasia: Secondary | ICD-10-CM | POA: Diagnosis not present

## 2020-06-17 DIAGNOSIS — Z79899 Other long term (current) drug therapy: Secondary | ICD-10-CM | POA: Diagnosis not present

## 2020-06-17 DIAGNOSIS — M6281 Muscle weakness (generalized): Secondary | ICD-10-CM | POA: Diagnosis not present

## 2020-06-17 DIAGNOSIS — D518 Other vitamin B12 deficiency anemias: Secondary | ICD-10-CM | POA: Diagnosis not present

## 2020-06-17 DIAGNOSIS — E038 Other specified hypothyroidism: Secondary | ICD-10-CM | POA: Diagnosis not present

## 2020-06-17 DIAGNOSIS — E785 Hyperlipidemia, unspecified: Secondary | ICD-10-CM | POA: Diagnosis not present

## 2020-06-19 DIAGNOSIS — G20 Parkinson's disease: Secondary | ICD-10-CM | POA: Diagnosis not present

## 2020-06-19 DIAGNOSIS — M6281 Muscle weakness (generalized): Secondary | ICD-10-CM | POA: Diagnosis not present

## 2020-06-19 DIAGNOSIS — R2689 Other abnormalities of gait and mobility: Secondary | ICD-10-CM | POA: Diagnosis not present

## 2020-06-19 DIAGNOSIS — R4702 Dysphasia: Secondary | ICD-10-CM | POA: Diagnosis not present

## 2020-06-19 DIAGNOSIS — R1310 Dysphagia, unspecified: Secondary | ICD-10-CM | POA: Diagnosis not present

## 2020-06-20 DIAGNOSIS — R2689 Other abnormalities of gait and mobility: Secondary | ICD-10-CM | POA: Diagnosis not present

## 2020-06-20 DIAGNOSIS — M6281 Muscle weakness (generalized): Secondary | ICD-10-CM | POA: Diagnosis not present

## 2020-06-20 DIAGNOSIS — R1310 Dysphagia, unspecified: Secondary | ICD-10-CM | POA: Diagnosis not present

## 2020-06-20 DIAGNOSIS — G20 Parkinson's disease: Secondary | ICD-10-CM | POA: Diagnosis not present

## 2020-06-20 DIAGNOSIS — R4702 Dysphasia: Secondary | ICD-10-CM | POA: Diagnosis not present

## 2020-06-22 DIAGNOSIS — I10 Essential (primary) hypertension: Secondary | ICD-10-CM | POA: Diagnosis not present

## 2020-06-22 DIAGNOSIS — E559 Vitamin D deficiency, unspecified: Secondary | ICD-10-CM | POA: Diagnosis not present

## 2020-06-22 DIAGNOSIS — E038 Other specified hypothyroidism: Secondary | ICD-10-CM | POA: Diagnosis not present

## 2020-06-22 DIAGNOSIS — E119 Type 2 diabetes mellitus without complications: Secondary | ICD-10-CM | POA: Diagnosis not present

## 2020-06-22 DIAGNOSIS — D518 Other vitamin B12 deficiency anemias: Secondary | ICD-10-CM | POA: Diagnosis not present

## 2020-06-24 DIAGNOSIS — R4702 Dysphasia: Secondary | ICD-10-CM | POA: Diagnosis not present

## 2020-06-24 DIAGNOSIS — G20 Parkinson's disease: Secondary | ICD-10-CM | POA: Diagnosis not present

## 2020-06-24 DIAGNOSIS — R2689 Other abnormalities of gait and mobility: Secondary | ICD-10-CM | POA: Diagnosis not present

## 2020-06-24 DIAGNOSIS — M6281 Muscle weakness (generalized): Secondary | ICD-10-CM | POA: Diagnosis not present

## 2020-06-24 DIAGNOSIS — R1310 Dysphagia, unspecified: Secondary | ICD-10-CM | POA: Diagnosis not present

## 2020-06-26 DIAGNOSIS — R4702 Dysphasia: Secondary | ICD-10-CM | POA: Diagnosis not present

## 2020-06-26 DIAGNOSIS — M6281 Muscle weakness (generalized): Secondary | ICD-10-CM | POA: Diagnosis not present

## 2020-06-26 DIAGNOSIS — R1310 Dysphagia, unspecified: Secondary | ICD-10-CM | POA: Diagnosis not present

## 2020-06-26 DIAGNOSIS — G20 Parkinson's disease: Secondary | ICD-10-CM | POA: Diagnosis not present

## 2020-06-26 DIAGNOSIS — R2689 Other abnormalities of gait and mobility: Secondary | ICD-10-CM | POA: Diagnosis not present

## 2020-06-30 DIAGNOSIS — R4702 Dysphasia: Secondary | ICD-10-CM | POA: Diagnosis not present

## 2020-06-30 DIAGNOSIS — R1310 Dysphagia, unspecified: Secondary | ICD-10-CM | POA: Diagnosis not present

## 2020-06-30 DIAGNOSIS — R2689 Other abnormalities of gait and mobility: Secondary | ICD-10-CM | POA: Diagnosis not present

## 2020-06-30 DIAGNOSIS — G20 Parkinson's disease: Secondary | ICD-10-CM | POA: Diagnosis not present

## 2020-06-30 DIAGNOSIS — M6281 Muscle weakness (generalized): Secondary | ICD-10-CM | POA: Diagnosis not present

## 2020-07-02 DIAGNOSIS — R4702 Dysphasia: Secondary | ICD-10-CM | POA: Diagnosis not present

## 2020-07-02 DIAGNOSIS — G20 Parkinson's disease: Secondary | ICD-10-CM | POA: Diagnosis not present

## 2020-07-02 DIAGNOSIS — M6281 Muscle weakness (generalized): Secondary | ICD-10-CM | POA: Diagnosis not present

## 2020-07-02 DIAGNOSIS — R1310 Dysphagia, unspecified: Secondary | ICD-10-CM | POA: Diagnosis not present

## 2020-07-02 DIAGNOSIS — R2689 Other abnormalities of gait and mobility: Secondary | ICD-10-CM | POA: Diagnosis not present

## 2020-07-09 DIAGNOSIS — R1310 Dysphagia, unspecified: Secondary | ICD-10-CM | POA: Diagnosis not present

## 2020-07-09 DIAGNOSIS — R4702 Dysphasia: Secondary | ICD-10-CM | POA: Diagnosis not present

## 2020-07-09 DIAGNOSIS — M6281 Muscle weakness (generalized): Secondary | ICD-10-CM | POA: Diagnosis not present

## 2020-07-09 DIAGNOSIS — R2689 Other abnormalities of gait and mobility: Secondary | ICD-10-CM | POA: Diagnosis not present

## 2020-07-09 DIAGNOSIS — S42032A Displaced fracture of lateral end of left clavicle, initial encounter for closed fracture: Secondary | ICD-10-CM | POA: Diagnosis not present

## 2020-07-09 DIAGNOSIS — G20 Parkinson's disease: Secondary | ICD-10-CM | POA: Diagnosis not present

## 2020-07-09 DIAGNOSIS — S43112A Subluxation of left acromioclavicular joint, initial encounter: Secondary | ICD-10-CM | POA: Diagnosis not present

## 2020-07-09 DIAGNOSIS — M19012 Primary osteoarthritis, left shoulder: Secondary | ICD-10-CM | POA: Diagnosis not present

## 2020-07-11 DIAGNOSIS — G20 Parkinson's disease: Secondary | ICD-10-CM | POA: Diagnosis not present

## 2020-07-12 DIAGNOSIS — M6281 Muscle weakness (generalized): Secondary | ICD-10-CM | POA: Diagnosis not present

## 2020-07-12 DIAGNOSIS — R2689 Other abnormalities of gait and mobility: Secondary | ICD-10-CM | POA: Diagnosis not present

## 2020-07-12 DIAGNOSIS — S43112A Subluxation of left acromioclavicular joint, initial encounter: Secondary | ICD-10-CM | POA: Diagnosis not present

## 2020-07-12 DIAGNOSIS — R4702 Dysphasia: Secondary | ICD-10-CM | POA: Diagnosis not present

## 2020-07-12 DIAGNOSIS — G20 Parkinson's disease: Secondary | ICD-10-CM | POA: Diagnosis not present

## 2020-07-12 DIAGNOSIS — R1310 Dysphagia, unspecified: Secondary | ICD-10-CM | POA: Diagnosis not present

## 2020-07-12 DIAGNOSIS — S42032A Displaced fracture of lateral end of left clavicle, initial encounter for closed fracture: Secondary | ICD-10-CM | POA: Diagnosis not present

## 2020-07-12 DIAGNOSIS — M19012 Primary osteoarthritis, left shoulder: Secondary | ICD-10-CM | POA: Diagnosis not present

## 2020-07-17 DIAGNOSIS — M6281 Muscle weakness (generalized): Secondary | ICD-10-CM | POA: Diagnosis not present

## 2020-07-17 DIAGNOSIS — S43112A Subluxation of left acromioclavicular joint, initial encounter: Secondary | ICD-10-CM | POA: Diagnosis not present

## 2020-07-17 DIAGNOSIS — E559 Vitamin D deficiency, unspecified: Secondary | ICD-10-CM | POA: Diagnosis not present

## 2020-07-17 DIAGNOSIS — R2689 Other abnormalities of gait and mobility: Secondary | ICD-10-CM | POA: Diagnosis not present

## 2020-07-17 DIAGNOSIS — M19012 Primary osteoarthritis, left shoulder: Secondary | ICD-10-CM | POA: Diagnosis not present

## 2020-07-17 DIAGNOSIS — G20 Parkinson's disease: Secondary | ICD-10-CM | POA: Diagnosis not present

## 2020-07-17 DIAGNOSIS — R4702 Dysphasia: Secondary | ICD-10-CM | POA: Diagnosis not present

## 2020-07-17 DIAGNOSIS — D518 Other vitamin B12 deficiency anemias: Secondary | ICD-10-CM | POA: Diagnosis not present

## 2020-07-17 DIAGNOSIS — E119 Type 2 diabetes mellitus without complications: Secondary | ICD-10-CM | POA: Diagnosis not present

## 2020-07-17 DIAGNOSIS — R1310 Dysphagia, unspecified: Secondary | ICD-10-CM | POA: Diagnosis not present

## 2020-07-17 DIAGNOSIS — E038 Other specified hypothyroidism: Secondary | ICD-10-CM | POA: Diagnosis not present

## 2020-07-17 DIAGNOSIS — S42032A Displaced fracture of lateral end of left clavicle, initial encounter for closed fracture: Secondary | ICD-10-CM | POA: Diagnosis not present

## 2020-07-17 DIAGNOSIS — I10 Essential (primary) hypertension: Secondary | ICD-10-CM | POA: Diagnosis not present

## 2020-07-21 DIAGNOSIS — E785 Hyperlipidemia, unspecified: Secondary | ICD-10-CM | POA: Diagnosis not present

## 2020-07-21 DIAGNOSIS — I10 Essential (primary) hypertension: Secondary | ICD-10-CM | POA: Diagnosis not present

## 2020-07-21 DIAGNOSIS — G20 Parkinson's disease: Secondary | ICD-10-CM | POA: Diagnosis not present

## 2020-07-22 DIAGNOSIS — M25512 Pain in left shoulder: Secondary | ICD-10-CM | POA: Diagnosis not present

## 2020-07-24 DIAGNOSIS — R1310 Dysphagia, unspecified: Secondary | ICD-10-CM | POA: Diagnosis not present

## 2020-07-24 DIAGNOSIS — M6281 Muscle weakness (generalized): Secondary | ICD-10-CM | POA: Diagnosis not present

## 2020-07-24 DIAGNOSIS — M19012 Primary osteoarthritis, left shoulder: Secondary | ICD-10-CM | POA: Diagnosis not present

## 2020-07-24 DIAGNOSIS — G20 Parkinson's disease: Secondary | ICD-10-CM | POA: Diagnosis not present

## 2020-07-24 DIAGNOSIS — R2689 Other abnormalities of gait and mobility: Secondary | ICD-10-CM | POA: Diagnosis not present

## 2020-07-24 DIAGNOSIS — S43112A Subluxation of left acromioclavicular joint, initial encounter: Secondary | ICD-10-CM | POA: Diagnosis not present

## 2020-07-24 DIAGNOSIS — R4702 Dysphasia: Secondary | ICD-10-CM | POA: Diagnosis not present

## 2020-07-24 DIAGNOSIS — S42032A Displaced fracture of lateral end of left clavicle, initial encounter for closed fracture: Secondary | ICD-10-CM | POA: Diagnosis not present

## 2020-07-25 DIAGNOSIS — M25512 Pain in left shoulder: Secondary | ICD-10-CM | POA: Diagnosis not present

## 2020-07-25 DIAGNOSIS — B351 Tinea unguium: Secondary | ICD-10-CM | POA: Diagnosis not present

## 2020-07-30 DIAGNOSIS — G20 Parkinson's disease: Secondary | ICD-10-CM | POA: Diagnosis not present

## 2020-07-30 DIAGNOSIS — S42032A Displaced fracture of lateral end of left clavicle, initial encounter for closed fracture: Secondary | ICD-10-CM | POA: Diagnosis not present

## 2020-07-30 DIAGNOSIS — M6281 Muscle weakness (generalized): Secondary | ICD-10-CM | POA: Diagnosis not present

## 2020-07-30 DIAGNOSIS — S43112A Subluxation of left acromioclavicular joint, initial encounter: Secondary | ICD-10-CM | POA: Diagnosis not present

## 2020-07-30 DIAGNOSIS — M19012 Primary osteoarthritis, left shoulder: Secondary | ICD-10-CM | POA: Diagnosis not present

## 2020-07-30 DIAGNOSIS — R1310 Dysphagia, unspecified: Secondary | ICD-10-CM | POA: Diagnosis not present

## 2020-07-30 DIAGNOSIS — R2689 Other abnormalities of gait and mobility: Secondary | ICD-10-CM | POA: Diagnosis not present

## 2020-07-30 DIAGNOSIS — R4702 Dysphasia: Secondary | ICD-10-CM | POA: Diagnosis not present

## 2020-08-05 DIAGNOSIS — G20 Parkinson's disease: Secondary | ICD-10-CM | POA: Diagnosis not present

## 2020-08-05 DIAGNOSIS — R1310 Dysphagia, unspecified: Secondary | ICD-10-CM | POA: Diagnosis not present

## 2020-08-05 DIAGNOSIS — S42032A Displaced fracture of lateral end of left clavicle, initial encounter for closed fracture: Secondary | ICD-10-CM | POA: Diagnosis not present

## 2020-08-05 DIAGNOSIS — M19012 Primary osteoarthritis, left shoulder: Secondary | ICD-10-CM | POA: Diagnosis not present

## 2020-08-05 DIAGNOSIS — M6281 Muscle weakness (generalized): Secondary | ICD-10-CM | POA: Diagnosis not present

## 2020-08-05 DIAGNOSIS — R4702 Dysphasia: Secondary | ICD-10-CM | POA: Diagnosis not present

## 2020-08-05 DIAGNOSIS — S43112A Subluxation of left acromioclavicular joint, initial encounter: Secondary | ICD-10-CM | POA: Diagnosis not present

## 2020-08-05 DIAGNOSIS — R2689 Other abnormalities of gait and mobility: Secondary | ICD-10-CM | POA: Diagnosis not present

## 2020-08-06 DIAGNOSIS — G20 Parkinson's disease: Secondary | ICD-10-CM | POA: Diagnosis not present

## 2020-08-06 DIAGNOSIS — M19012 Primary osteoarthritis, left shoulder: Secondary | ICD-10-CM | POA: Diagnosis not present

## 2020-08-06 DIAGNOSIS — S43112A Subluxation of left acromioclavicular joint, initial encounter: Secondary | ICD-10-CM | POA: Diagnosis not present

## 2020-08-06 DIAGNOSIS — R2689 Other abnormalities of gait and mobility: Secondary | ICD-10-CM | POA: Diagnosis not present

## 2020-08-06 DIAGNOSIS — S42032A Displaced fracture of lateral end of left clavicle, initial encounter for closed fracture: Secondary | ICD-10-CM | POA: Diagnosis not present

## 2020-08-07 ENCOUNTER — Other Ambulatory Visit: Payer: Self-pay | Admitting: Physician Assistant

## 2020-08-07 DIAGNOSIS — M25512 Pain in left shoulder: Secondary | ICD-10-CM

## 2020-08-13 DIAGNOSIS — R4702 Dysphasia: Secondary | ICD-10-CM | POA: Diagnosis not present

## 2020-08-13 DIAGNOSIS — M6281 Muscle weakness (generalized): Secondary | ICD-10-CM | POA: Diagnosis not present

## 2020-08-13 DIAGNOSIS — S42032A Displaced fracture of lateral end of left clavicle, initial encounter for closed fracture: Secondary | ICD-10-CM | POA: Diagnosis not present

## 2020-08-13 DIAGNOSIS — E119 Type 2 diabetes mellitus without complications: Secondary | ICD-10-CM | POA: Diagnosis not present

## 2020-08-13 DIAGNOSIS — I10 Essential (primary) hypertension: Secondary | ICD-10-CM | POA: Diagnosis not present

## 2020-08-13 DIAGNOSIS — E038 Other specified hypothyroidism: Secondary | ICD-10-CM | POA: Diagnosis not present

## 2020-08-13 DIAGNOSIS — M19012 Primary osteoarthritis, left shoulder: Secondary | ICD-10-CM | POA: Diagnosis not present

## 2020-08-13 DIAGNOSIS — R2689 Other abnormalities of gait and mobility: Secondary | ICD-10-CM | POA: Diagnosis not present

## 2020-08-13 DIAGNOSIS — S43112A Subluxation of left acromioclavicular joint, initial encounter: Secondary | ICD-10-CM | POA: Diagnosis not present

## 2020-08-13 DIAGNOSIS — E559 Vitamin D deficiency, unspecified: Secondary | ICD-10-CM | POA: Diagnosis not present

## 2020-08-13 DIAGNOSIS — G20 Parkinson's disease: Secondary | ICD-10-CM | POA: Diagnosis not present

## 2020-08-13 DIAGNOSIS — R1310 Dysphagia, unspecified: Secondary | ICD-10-CM | POA: Diagnosis not present

## 2020-08-13 DIAGNOSIS — D518 Other vitamin B12 deficiency anemias: Secondary | ICD-10-CM | POA: Diagnosis not present

## 2020-08-15 ENCOUNTER — Emergency Department
Admission: EM | Admit: 2020-08-15 | Discharge: 2020-08-16 | Disposition: A | Discharge status: Home / Self Care | Payer: Medicare Other | Attending: Emergency Medicine | Admitting: Emergency Medicine

## 2020-08-15 ENCOUNTER — Other Ambulatory Visit: Payer: Self-pay

## 2020-08-15 ENCOUNTER — Emergency Department: Payer: Medicare Other

## 2020-08-15 DIAGNOSIS — W19XXXA Unspecified fall, initial encounter: Secondary | ICD-10-CM | POA: Diagnosis not present

## 2020-08-15 DIAGNOSIS — G319 Degenerative disease of nervous system, unspecified: Secondary | ICD-10-CM | POA: Diagnosis not present

## 2020-08-15 DIAGNOSIS — G20 Parkinson's disease: Secondary | ICD-10-CM | POA: Diagnosis not present

## 2020-08-15 DIAGNOSIS — Y92129 Unspecified place in nursing home as the place of occurrence of the external cause: Secondary | ICD-10-CM | POA: Diagnosis not present

## 2020-08-15 DIAGNOSIS — R42 Dizziness and giddiness: Secondary | ICD-10-CM | POA: Insufficient documentation

## 2020-08-15 DIAGNOSIS — R531 Weakness: Secondary | ICD-10-CM | POA: Diagnosis not present

## 2020-08-15 DIAGNOSIS — S4292XA Fracture of left shoulder girdle, part unspecified, initial encounter for closed fracture: Secondary | ICD-10-CM | POA: Diagnosis not present

## 2020-08-15 DIAGNOSIS — S0003XA Contusion of scalp, initial encounter: Secondary | ICD-10-CM | POA: Diagnosis not present

## 2020-08-15 DIAGNOSIS — I6529 Occlusion and stenosis of unspecified carotid artery: Secondary | ICD-10-CM | POA: Diagnosis not present

## 2020-08-15 DIAGNOSIS — F0390 Unspecified dementia without behavioral disturbance: Secondary | ICD-10-CM | POA: Insufficient documentation

## 2020-08-15 DIAGNOSIS — M25412 Effusion, left shoulder: Secondary | ICD-10-CM | POA: Diagnosis not present

## 2020-08-15 DIAGNOSIS — Z743 Need for continuous supervision: Secondary | ICD-10-CM | POA: Diagnosis not present

## 2020-08-15 DIAGNOSIS — Z79899 Other long term (current) drug therapy: Secondary | ICD-10-CM | POA: Insufficient documentation

## 2020-08-15 DIAGNOSIS — I10 Essential (primary) hypertension: Secondary | ICD-10-CM | POA: Insufficient documentation

## 2020-08-15 DIAGNOSIS — R404 Transient alteration of awareness: Secondary | ICD-10-CM | POA: Diagnosis not present

## 2020-08-15 DIAGNOSIS — S42032A Displaced fracture of lateral end of left clavicle, initial encounter for closed fracture: Secondary | ICD-10-CM | POA: Diagnosis not present

## 2020-08-15 DIAGNOSIS — S42002A Fracture of unspecified part of left clavicle, initial encounter for closed fracture: Secondary | ICD-10-CM | POA: Diagnosis not present

## 2020-08-15 LAB — URINALYSIS, COMPLETE (UACMP) WITH MICROSCOPIC
Bacteria, UA: NONE SEEN
Bilirubin Urine: NEGATIVE
Glucose, UA: NEGATIVE mg/dL
Hgb urine dipstick: NEGATIVE
Ketones, ur: NEGATIVE mg/dL
Leukocytes,Ua: NEGATIVE
Nitrite: NEGATIVE
Protein, ur: NEGATIVE mg/dL
Specific Gravity, Urine: 1.018 (ref 1.005–1.030)
Squamous Epithelial / HPF: NONE SEEN (ref 0–5)
pH: 7 (ref 5.0–8.0)

## 2020-08-15 LAB — TROPONIN I (HIGH SENSITIVITY)
Troponin I (High Sensitivity): 33 ng/L — ABNORMAL HIGH (ref <18)
Troponin I (High Sensitivity): 33 ng/L — ABNORMAL HIGH (ref <18)

## 2020-08-15 LAB — COMPREHENSIVE METABOLIC PANEL
ALT: 27 U/L (ref 0–44)
AST: 88 U/L — ABNORMAL HIGH (ref 15–41)
Albumin: 3.8 g/dL (ref 3.5–5.0)
Alkaline Phosphatase: 71 U/L (ref 38–126)
Anion gap: 10 (ref 5–15)
BUN: 18 mg/dL (ref 8–23)
CO2: 26 mmol/L (ref 22–32)
Calcium: 8.6 mg/dL — ABNORMAL LOW (ref 8.9–10.3)
Chloride: 105 mmol/L (ref 98–111)
Creatinine, Ser: 0.67 mg/dL (ref 0.44–1.00)
GFR, Estimated: >60 mL/min (ref >60)
Glucose, Bld: 149 mg/dL — ABNORMAL HIGH (ref 70–99)
Potassium: 3.9 mmol/L (ref 3.5–5.1)
Sodium: 141 mmol/L (ref 135–145)
Total Bilirubin: 0.9 mg/dL (ref 0.3–1.2)
Total Protein: 6.4 g/dL — ABNORMAL LOW (ref 6.5–8.1)

## 2020-08-15 LAB — CBC
HCT: 37.2 % (ref 36.0–46.0)
Hemoglobin: 12.3 g/dL (ref 12.0–15.0)
MCH: 28.4 pg (ref 26.0–34.0)
MCHC: 33.1 g/dL (ref 30.0–36.0)
MCV: 85.9 fL (ref 80.0–100.0)
Platelets: 105 10*3/uL — ABNORMAL LOW (ref 150–400)
RBC: 4.33 MIL/uL (ref 3.87–5.11)
RDW: 13.4 % (ref 11.5–15.5)
WBC: 7.9 10*3/uL (ref 4.0–10.5)
nRBC: 0 % (ref 0.0–0.2)

## 2020-08-15 NOTE — ED Notes (Signed)
CT at bedside 

## 2020-08-15 NOTE — ED Triage Notes (Signed)
Mechanical fall . Pt resident of [facility] . No LOC. aprox time 4pm today

## 2020-08-15 NOTE — ED Provider Notes (Signed)
[facility] Emergency Department Provider Note  Time seen: 7:56 PM  I have reviewed the triage vital signs and the nursing notes.   HISTORY  Chief Complaint Fall (From [facility] , [place] time ( 4pm today ) . No LOC , mechainical fall per EMS. )   HPI Melissa Wise is a 69 y.o. female with a past medical history of dementia, Parkinson's, hypertension, hyperlipidemia presents to the emergency department after a fall.  According to EMS report patient was at her nursing facility had a fall that was unwitnessed.  Patient has a history of dementia but states she has been feeling lightheaded recently.  Patient denies any pain complaints.  Overall she is calm cooperative and pleasant.   Past Medical History:  Diagnosis Date   Allergy    Ataxia    Dementia ([place])    GERD (gastroesophageal reflux disease)    Hyperglycemia    Hyperlipidemia    Hypertension    Insomnia    Low back pain     Patient Active Problem List   Diagnosis Date Noted   Depression, recurrent ([place]) 12/19/2018   Peripheral edema 10/04/2016   Parkinson disease ([place]) 09/03/2016   Hypertension 02/04/2016   Hyperlipidemia 02/04/2016    Past Surgical History:  Procedure Laterality Date   ABDOMINAL HYSTERECTOMY  1991   Complete, no cancer   CATARACT EXTRACTION Left    SHOULDER SURGERY Right 2015    Prior to Admission medications   Medication Sig Start Date End Date Taking? Authorizing Provider  carbidopa-levodopa (SINEMET IR) 25-100 MG tablet  10/07/16   [provider]  COCONUT OIL PO Take by mouth.    [provider]  FIBER ADULT GUMMIES PO Take by mouth.    [provider]  lovastatin (MEVACOR) 40 MG tablet TAKE 1 TABLET BY MOUTH AT BEDTIME 10/15/19   Johnson, Megan P, DO  Multiple Vitamin (MULTIVITAMIN) tablet Take 1 tablet by mouth daily.    [provider]  Omega-3 Fatty Acids (FISH OIL) 1000 MG CAPS Take 300 mg by  mouth.     [provider]  QUEtiapine (SEROQUEL) 100 MG tablet Take 100 mg by mouth at bedtime.     [provider]  rivastigmine (EXELON) 9.5 mg/24hr 9.5 mg daily. 12/12/19   [provider]  terazosin (HYTRIN) 1 MG capsule Take 1 mg by mouth at bedtime.  06/28/19 06/27/20  [provider]  TURMERIC PO Take by mouth.    [provider]    Allergies  Allergen Reactions   Amlodipine Swelling   Enalapril Cough    Family History  Problem Relation Age of Onset   Cancer Mother    Hypertension Father    Heart attack Father    Diabetes Maternal Grandmother    Cancer Paternal Grandmother        Stomach    Social History Social History   Tobacco Use   Smoking status: Never Smoker   Smokeless tobacco: Never Used  Vaping Use   Vaping Use: Never used  Substance Use Topics   Alcohol use: Yes    Alcohol/week: 1.0 standard drink    Types: 1 Glasses of wine per week    Comment: occassionally   Drug use: No    Review of Systems Unable to obtain adequate/accurate review of systems secondary to baseline dementia.  ____________________________________________   PHYSICAL EXAM:  VITAL SIGNS: ED Triage Vitals  Enc Vitals Group     BP 08/15/20 1907 [name])  149/87     Pulse Rate 08/15/20 1907 (!) 102     Resp 08/15/20 1907 18     Temp 08/15/20 1907 98 F (36.7 C)     Temp src --      SpO2 08/15/20 1907 99 %     Weight 08/15/20 1908 156 lb 15.5 oz (71.2 kg)     Height 08/15/20 1908 5' 7.5" (1.715 m)     Head Circumference --      Peak Flow --      Pain Score 08/15/20 1907 0     Pain Loc --      Pain Edu? --      Excl. in [place]? --    Constitutional: Awake alert, no distress.  Calm and cooperative. Eyes: Normal exam ENT      Head: Normocephalic and atraumatic.      Mouth/Throat: Mucous membranes are moist. Cardiovascular: Normal rate, regular rhythm Respiratory: Normal respiratory effort without tachypnea nor retractions.  Breath sounds are clear Gastrointestinal: Soft and nontender. No distention.   Musculoskeletal: Nontender with normal range of motion in all extremities.  Neurologic:  Normal speech and language. No gross focal neurologic deficits  Skin:  Skin is warm, dry and intact.  Psychiatric: Mood and affect are normal.   ____________________________________________    EKG  EKG viewed and interpreted by myself shows sinus tachycardia 103 bpm with a narrow QRS, normal axis, normal intervals, nonspecific ST changes.  ____________________________________________    RADIOLOGY  CT shows subacute clavicle fracture. CT head shows no acute abnormality.  ____________________________________________   INITIAL IMPRESSION / ASSESSMENT AND PLAN / ED COURSE  Pertinent labs & imaging results that were available during my care of the patient were reviewed by me and considered in my medical decision making (see chart for details).   Patient presents to the emergency department after an unwitnessed fall at her nursing facility.  Overall the patient appears well calm and cooperative.  We will check labs including urinalysis, obtain a CT scan of the head.  Patient does have a shoulder CT scheduled in several days for recent injury to rule out fracture we will scan while in the emergency department and we are already scanning her head given her history of dementia and inability to obtain adequate history.  Lab work largely at baseline/nonrevealing.  Repeat troponin is unchanged.  Urinalysis pending.  If urinalysis is negative anticipate discharge home to a nursing facility.  Patient care signed out to Dr. Tamala Julian.  Melissa Wise was evaluated in Emergency Department on 08/15/2020 for the symptoms described in the history of present illness. She was evaluated in the context of the global COVID-19 pandemic, which necessitated consideration that the patient might be at risk for infection with the SARS-CoV-2 virus that  causes COVID-19. Institutional protocols and algorithms that pertain to the evaluation of patients at risk for COVID-19 are in a state of rapid change based on information released by regulatory bodies including the CDC and federal and state organizations. These policies and algorithms were followed during the patient's care in the ED.  ____________________________________________   FINAL CLINICAL IMPRESSION(S) / ED DIAGNOSES  Cheral Marker, MD 08/15/20 [id number]

## 2020-08-15 NOTE — ED Notes (Signed)
I&O attempted , unsuccessful.   ED tech will attempt

## 2020-08-16 DIAGNOSIS — M2550 Pain in unspecified joint: Secondary | ICD-10-CM | POA: Diagnosis not present

## 2020-08-16 DIAGNOSIS — Z7401 Bed confinement status: Secondary | ICD-10-CM | POA: Diagnosis not present

## 2020-08-16 DIAGNOSIS — R404 Transient alteration of awareness: Secondary | ICD-10-CM | POA: Diagnosis not present

## 2020-08-16 DIAGNOSIS — Z743 Need for continuous supervision: Secondary | ICD-10-CM | POA: Diagnosis not present

## 2020-08-16 NOTE — ED Notes (Signed)
EMS transport requested

## 2020-08-16 NOTE — ED Notes (Signed)
Discharge intructions reviewed with patient sister. Report given to facility

## 2020-08-16 NOTE — ED Notes (Signed)
EMS arrived , Report given to paramedic.  Pt calm , collective upon transport. Denied pain or sob

## 2020-08-18 ENCOUNTER — Ambulatory Visit: Payer: Medicare Other

## 2020-08-18 DIAGNOSIS — E785 Hyperlipidemia, unspecified: Secondary | ICD-10-CM | POA: Diagnosis not present

## 2020-08-18 DIAGNOSIS — R1310 Dysphagia, unspecified: Secondary | ICD-10-CM | POA: Diagnosis not present

## 2020-08-18 DIAGNOSIS — G20 Parkinson's disease: Secondary | ICD-10-CM | POA: Diagnosis not present

## 2020-08-18 DIAGNOSIS — M19012 Primary osteoarthritis, left shoulder: Secondary | ICD-10-CM | POA: Diagnosis not present

## 2020-08-18 DIAGNOSIS — I10 Essential (primary) hypertension: Secondary | ICD-10-CM | POA: Diagnosis not present

## 2020-08-18 DIAGNOSIS — M6281 Muscle weakness (generalized): Secondary | ICD-10-CM | POA: Diagnosis not present

## 2020-08-18 DIAGNOSIS — R2689 Other abnormalities of gait and mobility: Secondary | ICD-10-CM | POA: Diagnosis not present

## 2020-08-18 DIAGNOSIS — S42032A Displaced fracture of lateral end of left clavicle, initial encounter for closed fracture: Secondary | ICD-10-CM | POA: Diagnosis not present

## 2020-08-18 DIAGNOSIS — S43112A Subluxation of left acromioclavicular joint, initial encounter: Secondary | ICD-10-CM | POA: Diagnosis not present

## 2020-08-18 DIAGNOSIS — R4702 Dysphasia: Secondary | ICD-10-CM | POA: Diagnosis not present

## 2020-08-20 DIAGNOSIS — R2689 Other abnormalities of gait and mobility: Secondary | ICD-10-CM | POA: Diagnosis not present

## 2020-08-20 DIAGNOSIS — M19012 Primary osteoarthritis, left shoulder: Secondary | ICD-10-CM | POA: Diagnosis not present

## 2020-08-20 DIAGNOSIS — M6281 Muscle weakness (generalized): Secondary | ICD-10-CM | POA: Diagnosis not present

## 2020-08-20 DIAGNOSIS — R1310 Dysphagia, unspecified: Secondary | ICD-10-CM | POA: Diagnosis not present

## 2020-08-20 DIAGNOSIS — S43112A Subluxation of left acromioclavicular joint, initial encounter: Secondary | ICD-10-CM | POA: Diagnosis not present

## 2020-08-20 DIAGNOSIS — S42032A Displaced fracture of lateral end of left clavicle, initial encounter for closed fracture: Secondary | ICD-10-CM | POA: Diagnosis not present

## 2020-08-20 DIAGNOSIS — G20 Parkinson's disease: Secondary | ICD-10-CM | POA: Diagnosis not present

## 2020-08-20 DIAGNOSIS — R4702 Dysphasia: Secondary | ICD-10-CM | POA: Diagnosis not present

## 2020-08-22 DIAGNOSIS — G20 Parkinson's disease: Secondary | ICD-10-CM | POA: Diagnosis not present

## 2020-08-22 DIAGNOSIS — G4700 Insomnia, unspecified: Secondary | ICD-10-CM | POA: Diagnosis not present

## 2020-08-26 DIAGNOSIS — R2689 Other abnormalities of gait and mobility: Secondary | ICD-10-CM | POA: Diagnosis not present

## 2020-08-26 DIAGNOSIS — R4702 Dysphasia: Secondary | ICD-10-CM | POA: Diagnosis not present

## 2020-08-26 DIAGNOSIS — G20 Parkinson's disease: Secondary | ICD-10-CM | POA: Diagnosis not present

## 2020-08-26 DIAGNOSIS — M6281 Muscle weakness (generalized): Secondary | ICD-10-CM | POA: Diagnosis not present

## 2020-08-26 DIAGNOSIS — R1310 Dysphagia, unspecified: Secondary | ICD-10-CM | POA: Diagnosis not present

## 2020-08-26 DIAGNOSIS — M19012 Primary osteoarthritis, left shoulder: Secondary | ICD-10-CM | POA: Diagnosis not present

## 2020-08-26 DIAGNOSIS — S42032A Displaced fracture of lateral end of left clavicle, initial encounter for closed fracture: Secondary | ICD-10-CM | POA: Diagnosis not present

## 2020-08-26 DIAGNOSIS — S43112A Subluxation of left acromioclavicular joint, initial encounter: Secondary | ICD-10-CM | POA: Diagnosis not present

## 2020-08-27 DIAGNOSIS — S42002A Fracture of unspecified part of left clavicle, initial encounter for closed fracture: Secondary | ICD-10-CM | POA: Diagnosis not present

## 2020-08-28 DIAGNOSIS — S43112A Subluxation of left acromioclavicular joint, initial encounter: Secondary | ICD-10-CM | POA: Diagnosis not present

## 2020-08-28 DIAGNOSIS — M19012 Primary osteoarthritis, left shoulder: Secondary | ICD-10-CM | POA: Diagnosis not present

## 2020-08-28 DIAGNOSIS — G20 Parkinson's disease: Secondary | ICD-10-CM | POA: Diagnosis not present

## 2020-08-28 DIAGNOSIS — R4702 Dysphasia: Secondary | ICD-10-CM | POA: Diagnosis not present

## 2020-08-28 DIAGNOSIS — M6281 Muscle weakness (generalized): Secondary | ICD-10-CM | POA: Diagnosis not present

## 2020-08-28 DIAGNOSIS — S42032A Displaced fracture of lateral end of left clavicle, initial encounter for closed fracture: Secondary | ICD-10-CM | POA: Diagnosis not present

## 2020-08-28 DIAGNOSIS — R1310 Dysphagia, unspecified: Secondary | ICD-10-CM | POA: Diagnosis not present

## 2020-08-28 DIAGNOSIS — R2689 Other abnormalities of gait and mobility: Secondary | ICD-10-CM | POA: Diagnosis not present

## 2020-08-29 DIAGNOSIS — R601 Generalized edema: Secondary | ICD-10-CM | POA: Diagnosis not present

## 2020-08-29 DIAGNOSIS — R0989 Other specified symptoms and signs involving the circulatory and respiratory systems: Secondary | ICD-10-CM | POA: Diagnosis not present

## 2020-08-29 DIAGNOSIS — R590 Localized enlarged lymph nodes: Secondary | ICD-10-CM | POA: Diagnosis not present

## 2020-08-29 DIAGNOSIS — I6523 Occlusion and stenosis of bilateral carotid arteries: Secondary | ICD-10-CM | POA: Diagnosis not present

## 2020-08-29 DIAGNOSIS — R229 Localized swelling, mass and lump, unspecified: Secondary | ICD-10-CM | POA: Diagnosis not present

## 2020-08-29 DIAGNOSIS — D492 Neoplasm of unspecified behavior of bone, soft tissue, and skin: Secondary | ICD-10-CM | POA: Diagnosis not present

## 2020-09-01 DIAGNOSIS — I714 Abdominal aortic aneurysm, without rupture: Secondary | ICD-10-CM | POA: Diagnosis not present

## 2020-09-01 DIAGNOSIS — E042 Nontoxic multinodular goiter: Secondary | ICD-10-CM | POA: Diagnosis not present

## 2020-09-01 DIAGNOSIS — I700 Atherosclerosis of aorta: Secondary | ICD-10-CM | POA: Diagnosis not present

## 2020-09-01 DIAGNOSIS — R221 Localized swelling, mass and lump, neck: Secondary | ICD-10-CM | POA: Diagnosis not present

## 2020-09-02 DIAGNOSIS — R1310 Dysphagia, unspecified: Secondary | ICD-10-CM | POA: Diagnosis not present

## 2020-09-02 DIAGNOSIS — G20 Parkinson's disease: Secondary | ICD-10-CM | POA: Diagnosis not present

## 2020-09-02 DIAGNOSIS — S42032A Displaced fracture of lateral end of left clavicle, initial encounter for closed fracture: Secondary | ICD-10-CM | POA: Diagnosis not present

## 2020-09-02 DIAGNOSIS — M6281 Muscle weakness (generalized): Secondary | ICD-10-CM | POA: Diagnosis not present

## 2020-09-02 DIAGNOSIS — Z79899 Other long term (current) drug therapy: Secondary | ICD-10-CM | POA: Diagnosis not present

## 2020-09-02 DIAGNOSIS — E119 Type 2 diabetes mellitus without complications: Secondary | ICD-10-CM | POA: Diagnosis not present

## 2020-09-02 DIAGNOSIS — D518 Other vitamin B12 deficiency anemias: Secondary | ICD-10-CM | POA: Diagnosis not present

## 2020-09-02 DIAGNOSIS — E7849 Other hyperlipidemia: Secondary | ICD-10-CM | POA: Diagnosis not present

## 2020-09-02 DIAGNOSIS — R4702 Dysphasia: Secondary | ICD-10-CM | POA: Diagnosis not present

## 2020-09-02 DIAGNOSIS — R2689 Other abnormalities of gait and mobility: Secondary | ICD-10-CM | POA: Diagnosis not present

## 2020-09-02 DIAGNOSIS — M19012 Primary osteoarthritis, left shoulder: Secondary | ICD-10-CM | POA: Diagnosis not present

## 2020-09-02 DIAGNOSIS — S43112A Subluxation of left acromioclavicular joint, initial encounter: Secondary | ICD-10-CM | POA: Diagnosis not present

## 2020-09-03 DIAGNOSIS — I70229 Atherosclerosis of native arteries of extremities with rest pain, unspecified extremity: Secondary | ICD-10-CM | POA: Diagnosis not present

## 2020-09-03 DIAGNOSIS — I739 Peripheral vascular disease, unspecified: Secondary | ICD-10-CM | POA: Diagnosis not present

## 2020-09-03 DIAGNOSIS — I70219 Atherosclerosis of native arteries of extremities with intermittent claudication, unspecified extremity: Secondary | ICD-10-CM | POA: Diagnosis not present

## 2020-09-11 DIAGNOSIS — R200 Anesthesia of skin: Secondary | ICD-10-CM | POA: Diagnosis not present

## 2020-09-11 DIAGNOSIS — G231 Progressive supranuclear ophthalmoplegia [Steele-Richardson-Olszewski]: Secondary | ICD-10-CM | POA: Diagnosis not present

## 2020-09-11 DIAGNOSIS — R413 Other amnesia: Secondary | ICD-10-CM | POA: Diagnosis not present

## 2020-09-11 DIAGNOSIS — G20 Parkinson's disease: Secondary | ICD-10-CM | POA: Diagnosis not present

## 2020-09-11 DIAGNOSIS — R262 Difficulty in walking, not elsewhere classified: Secondary | ICD-10-CM | POA: Diagnosis not present

## 2020-09-16 DIAGNOSIS — Z79899 Other long term (current) drug therapy: Secondary | ICD-10-CM | POA: Diagnosis not present

## 2020-09-16 DIAGNOSIS — E038 Other specified hypothyroidism: Secondary | ICD-10-CM | POA: Diagnosis not present

## 2020-09-16 DIAGNOSIS — D518 Other vitamin B12 deficiency anemias: Secondary | ICD-10-CM | POA: Diagnosis not present

## 2020-09-16 DIAGNOSIS — E119 Type 2 diabetes mellitus without complications: Secondary | ICD-10-CM | POA: Diagnosis not present

## 2020-09-16 DIAGNOSIS — E559 Vitamin D deficiency, unspecified: Secondary | ICD-10-CM | POA: Diagnosis not present

## 2020-09-16 DIAGNOSIS — E7849 Other hyperlipidemia: Secondary | ICD-10-CM | POA: Diagnosis not present

## 2020-09-17 DIAGNOSIS — E038 Other specified hypothyroidism: Secondary | ICD-10-CM | POA: Diagnosis not present

## 2020-09-17 DIAGNOSIS — I10 Essential (primary) hypertension: Secondary | ICD-10-CM | POA: Diagnosis not present

## 2020-09-17 DIAGNOSIS — E119 Type 2 diabetes mellitus without complications: Secondary | ICD-10-CM | POA: Diagnosis not present

## 2020-09-17 DIAGNOSIS — D518 Other vitamin B12 deficiency anemias: Secondary | ICD-10-CM | POA: Diagnosis not present

## 2020-09-17 DIAGNOSIS — E559 Vitamin D deficiency, unspecified: Secondary | ICD-10-CM | POA: Diagnosis not present

## 2020-10-08 DIAGNOSIS — U071 COVID-19: Secondary | ICD-10-CM | POA: Diagnosis not present

## 2020-10-09 DIAGNOSIS — U071 COVID-19: Secondary | ICD-10-CM | POA: Diagnosis not present

## 2020-10-16 DIAGNOSIS — E038 Other specified hypothyroidism: Secondary | ICD-10-CM | POA: Diagnosis not present

## 2020-10-16 DIAGNOSIS — I10 Essential (primary) hypertension: Secondary | ICD-10-CM | POA: Diagnosis not present

## 2020-10-16 DIAGNOSIS — D518 Other vitamin B12 deficiency anemias: Secondary | ICD-10-CM | POA: Diagnosis not present

## 2020-10-16 DIAGNOSIS — E559 Vitamin D deficiency, unspecified: Secondary | ICD-10-CM | POA: Diagnosis not present

## 2020-10-16 DIAGNOSIS — E119 Type 2 diabetes mellitus without complications: Secondary | ICD-10-CM | POA: Diagnosis not present

## 2020-10-17 DIAGNOSIS — B351 Tinea unguium: Secondary | ICD-10-CM | POA: Diagnosis not present

## 2020-10-21 DIAGNOSIS — Z961 Presence of intraocular lens: Secondary | ICD-10-CM | POA: Diagnosis not present

## 2020-10-21 DIAGNOSIS — H2511 Age-related nuclear cataract, right eye: Secondary | ICD-10-CM | POA: Diagnosis not present

## 2020-10-23 DIAGNOSIS — E785 Hyperlipidemia, unspecified: Secondary | ICD-10-CM | POA: Diagnosis not present

## 2020-10-23 DIAGNOSIS — E538 Deficiency of other specified B group vitamins: Secondary | ICD-10-CM | POA: Diagnosis not present

## 2020-10-23 DIAGNOSIS — E559 Vitamin D deficiency, unspecified: Secondary | ICD-10-CM | POA: Diagnosis not present

## 2020-10-23 DIAGNOSIS — G20 Parkinson's disease: Secondary | ICD-10-CM | POA: Diagnosis not present

## 2020-10-23 DIAGNOSIS — R600 Localized edema: Secondary | ICD-10-CM | POA: Diagnosis not present

## 2020-10-29 DIAGNOSIS — R2681 Unsteadiness on feet: Secondary | ICD-10-CM | POA: Diagnosis not present

## 2020-10-29 DIAGNOSIS — R296 Repeated falls: Secondary | ICD-10-CM | POA: Diagnosis not present

## 2020-10-29 DIAGNOSIS — M6281 Muscle weakness (generalized): Secondary | ICD-10-CM | POA: Diagnosis not present

## 2020-10-29 DIAGNOSIS — G20 Parkinson's disease: Secondary | ICD-10-CM | POA: Diagnosis not present

## 2020-10-29 DIAGNOSIS — I10 Essential (primary) hypertension: Secondary | ICD-10-CM | POA: Diagnosis not present

## 2020-10-30 DIAGNOSIS — I10 Essential (primary) hypertension: Secondary | ICD-10-CM | POA: Diagnosis not present

## 2020-10-30 DIAGNOSIS — R296 Repeated falls: Secondary | ICD-10-CM | POA: Diagnosis not present

## 2020-10-30 DIAGNOSIS — G20 Parkinson's disease: Secondary | ICD-10-CM | POA: Diagnosis not present

## 2020-10-30 DIAGNOSIS — M6281 Muscle weakness (generalized): Secondary | ICD-10-CM | POA: Diagnosis not present

## 2020-10-30 DIAGNOSIS — R2681 Unsteadiness on feet: Secondary | ICD-10-CM | POA: Diagnosis not present

## 2020-11-05 DIAGNOSIS — R2681 Unsteadiness on feet: Secondary | ICD-10-CM | POA: Diagnosis not present

## 2020-11-05 DIAGNOSIS — G20 Parkinson's disease: Secondary | ICD-10-CM | POA: Diagnosis not present

## 2020-11-05 DIAGNOSIS — R296 Repeated falls: Secondary | ICD-10-CM | POA: Diagnosis not present

## 2020-11-05 DIAGNOSIS — M6281 Muscle weakness (generalized): Secondary | ICD-10-CM | POA: Diagnosis not present

## 2020-11-05 DIAGNOSIS — I10 Essential (primary) hypertension: Secondary | ICD-10-CM | POA: Diagnosis not present

## 2020-11-06 DIAGNOSIS — U071 COVID-19: Secondary | ICD-10-CM | POA: Diagnosis not present

## 2020-11-11 DIAGNOSIS — G20 Parkinson's disease: Secondary | ICD-10-CM | POA: Diagnosis not present

## 2020-11-11 DIAGNOSIS — R296 Repeated falls: Secondary | ICD-10-CM | POA: Diagnosis not present

## 2020-11-11 DIAGNOSIS — I10 Essential (primary) hypertension: Secondary | ICD-10-CM | POA: Diagnosis not present

## 2020-11-11 DIAGNOSIS — M6281 Muscle weakness (generalized): Secondary | ICD-10-CM | POA: Diagnosis not present

## 2020-11-11 DIAGNOSIS — R2681 Unsteadiness on feet: Secondary | ICD-10-CM | POA: Diagnosis not present

## 2020-11-13 DIAGNOSIS — G231 Progressive supranuclear ophthalmoplegia [Steele-Richardson-Olszewski]: Secondary | ICD-10-CM | POA: Diagnosis not present

## 2020-11-13 DIAGNOSIS — R413 Other amnesia: Secondary | ICD-10-CM | POA: Diagnosis not present

## 2020-11-13 DIAGNOSIS — R262 Difficulty in walking, not elsewhere classified: Secondary | ICD-10-CM | POA: Diagnosis not present

## 2020-11-13 DIAGNOSIS — M25552 Pain in left hip: Secondary | ICD-10-CM | POA: Diagnosis not present

## 2020-11-14 DIAGNOSIS — I10 Essential (primary) hypertension: Secondary | ICD-10-CM | POA: Diagnosis not present

## 2020-11-14 DIAGNOSIS — R296 Repeated falls: Secondary | ICD-10-CM | POA: Diagnosis not present

## 2020-11-14 DIAGNOSIS — G20 Parkinson's disease: Secondary | ICD-10-CM | POA: Diagnosis not present

## 2020-11-14 DIAGNOSIS — R2681 Unsteadiness on feet: Secondary | ICD-10-CM | POA: Diagnosis not present

## 2020-11-14 DIAGNOSIS — M6281 Muscle weakness (generalized): Secondary | ICD-10-CM | POA: Diagnosis not present

## 2020-11-15 DIAGNOSIS — E785 Hyperlipidemia, unspecified: Secondary | ICD-10-CM | POA: Diagnosis not present

## 2020-11-15 DIAGNOSIS — D518 Other vitamin B12 deficiency anemias: Secondary | ICD-10-CM | POA: Diagnosis not present

## 2020-11-15 DIAGNOSIS — E038 Other specified hypothyroidism: Secondary | ICD-10-CM | POA: Diagnosis not present

## 2020-11-15 DIAGNOSIS — I10 Essential (primary) hypertension: Secondary | ICD-10-CM | POA: Diagnosis not present

## 2020-11-15 DIAGNOSIS — E559 Vitamin D deficiency, unspecified: Secondary | ICD-10-CM | POA: Diagnosis not present

## 2020-11-15 DIAGNOSIS — E119 Type 2 diabetes mellitus without complications: Secondary | ICD-10-CM | POA: Diagnosis not present

## 2020-11-18 DIAGNOSIS — G20 Parkinson's disease: Secondary | ICD-10-CM | POA: Diagnosis not present

## 2020-11-18 DIAGNOSIS — R296 Repeated falls: Secondary | ICD-10-CM | POA: Diagnosis not present

## 2020-11-18 DIAGNOSIS — R2681 Unsteadiness on feet: Secondary | ICD-10-CM | POA: Diagnosis not present

## 2020-11-18 DIAGNOSIS — I10 Essential (primary) hypertension: Secondary | ICD-10-CM | POA: Diagnosis not present

## 2020-11-18 DIAGNOSIS — M6281 Muscle weakness (generalized): Secondary | ICD-10-CM | POA: Diagnosis not present

## 2020-11-21 DIAGNOSIS — I10 Essential (primary) hypertension: Secondary | ICD-10-CM | POA: Diagnosis not present

## 2020-11-21 DIAGNOSIS — G20 Parkinson's disease: Secondary | ICD-10-CM | POA: Diagnosis not present

## 2020-11-21 DIAGNOSIS — R296 Repeated falls: Secondary | ICD-10-CM | POA: Diagnosis not present

## 2020-11-21 DIAGNOSIS — R2681 Unsteadiness on feet: Secondary | ICD-10-CM | POA: Diagnosis not present

## 2020-11-21 DIAGNOSIS — M6281 Muscle weakness (generalized): Secondary | ICD-10-CM | POA: Diagnosis not present

## 2020-11-27 DIAGNOSIS — E785 Hyperlipidemia, unspecified: Secondary | ICD-10-CM | POA: Diagnosis not present

## 2020-11-27 DIAGNOSIS — I10 Essential (primary) hypertension: Secondary | ICD-10-CM | POA: Diagnosis not present

## 2020-11-27 DIAGNOSIS — E559 Vitamin D deficiency, unspecified: Secondary | ICD-10-CM | POA: Diagnosis not present

## 2020-11-27 DIAGNOSIS — R600 Localized edema: Secondary | ICD-10-CM | POA: Diagnosis not present

## 2020-11-27 DIAGNOSIS — E538 Deficiency of other specified B group vitamins: Secondary | ICD-10-CM | POA: Diagnosis not present

## 2020-11-27 DIAGNOSIS — G20 Parkinson's disease: Secondary | ICD-10-CM | POA: Diagnosis not present

## 2020-11-27 DIAGNOSIS — G4700 Insomnia, unspecified: Secondary | ICD-10-CM | POA: Diagnosis not present

## 2020-12-09 DIAGNOSIS — G20 Parkinson's disease: Secondary | ICD-10-CM | POA: Diagnosis not present

## 2020-12-09 DIAGNOSIS — M6281 Muscle weakness (generalized): Secondary | ICD-10-CM | POA: Diagnosis not present

## 2020-12-09 DIAGNOSIS — R2681 Unsteadiness on feet: Secondary | ICD-10-CM | POA: Diagnosis not present

## 2020-12-09 DIAGNOSIS — R296 Repeated falls: Secondary | ICD-10-CM | POA: Diagnosis not present

## 2020-12-15 DIAGNOSIS — E785 Hyperlipidemia, unspecified: Secondary | ICD-10-CM | POA: Diagnosis not present

## 2020-12-15 DIAGNOSIS — E038 Other specified hypothyroidism: Secondary | ICD-10-CM | POA: Diagnosis not present

## 2020-12-15 DIAGNOSIS — E119 Type 2 diabetes mellitus without complications: Secondary | ICD-10-CM | POA: Diagnosis not present

## 2020-12-15 DIAGNOSIS — D518 Other vitamin B12 deficiency anemias: Secondary | ICD-10-CM | POA: Diagnosis not present

## 2020-12-15 DIAGNOSIS — E559 Vitamin D deficiency, unspecified: Secondary | ICD-10-CM | POA: Diagnosis not present

## 2020-12-15 DIAGNOSIS — I10 Essential (primary) hypertension: Secondary | ICD-10-CM | POA: Diagnosis not present

## 2020-12-16 DIAGNOSIS — D518 Other vitamin B12 deficiency anemias: Secondary | ICD-10-CM | POA: Diagnosis not present

## 2020-12-16 DIAGNOSIS — E119 Type 2 diabetes mellitus without complications: Secondary | ICD-10-CM | POA: Diagnosis not present

## 2020-12-16 DIAGNOSIS — E038 Other specified hypothyroidism: Secondary | ICD-10-CM | POA: Diagnosis not present

## 2020-12-16 DIAGNOSIS — Z79899 Other long term (current) drug therapy: Secondary | ICD-10-CM | POA: Diagnosis not present

## 2020-12-16 DIAGNOSIS — E559 Vitamin D deficiency, unspecified: Secondary | ICD-10-CM | POA: Diagnosis not present

## 2020-12-16 DIAGNOSIS — E7849 Other hyperlipidemia: Secondary | ICD-10-CM | POA: Diagnosis not present

## 2020-12-25 DIAGNOSIS — E559 Vitamin D deficiency, unspecified: Secondary | ICD-10-CM | POA: Diagnosis not present

## 2020-12-25 DIAGNOSIS — G4700 Insomnia, unspecified: Secondary | ICD-10-CM | POA: Diagnosis not present

## 2020-12-25 DIAGNOSIS — I10 Essential (primary) hypertension: Secondary | ICD-10-CM | POA: Diagnosis not present

## 2020-12-25 DIAGNOSIS — E785 Hyperlipidemia, unspecified: Secondary | ICD-10-CM | POA: Diagnosis not present

## 2020-12-25 DIAGNOSIS — G20 Parkinson's disease: Secondary | ICD-10-CM | POA: Diagnosis not present

## 2020-12-25 DIAGNOSIS — R600 Localized edema: Secondary | ICD-10-CM | POA: Diagnosis not present

## 2020-12-25 DIAGNOSIS — E538 Deficiency of other specified B group vitamins: Secondary | ICD-10-CM | POA: Diagnosis not present

## 2021-01-12 DIAGNOSIS — E785 Hyperlipidemia, unspecified: Secondary | ICD-10-CM | POA: Diagnosis not present

## 2021-01-12 DIAGNOSIS — E119 Type 2 diabetes mellitus without complications: Secondary | ICD-10-CM | POA: Diagnosis not present

## 2021-01-12 DIAGNOSIS — E038 Other specified hypothyroidism: Secondary | ICD-10-CM | POA: Diagnosis not present

## 2021-01-12 DIAGNOSIS — D518 Other vitamin B12 deficiency anemias: Secondary | ICD-10-CM | POA: Diagnosis not present

## 2021-01-12 DIAGNOSIS — E559 Vitamin D deficiency, unspecified: Secondary | ICD-10-CM | POA: Diagnosis not present

## 2021-01-12 DIAGNOSIS — I10 Essential (primary) hypertension: Secondary | ICD-10-CM | POA: Diagnosis not present

## 2021-01-22 DIAGNOSIS — B351 Tinea unguium: Secondary | ICD-10-CM | POA: Diagnosis not present

## 2021-02-03 DIAGNOSIS — E538 Deficiency of other specified B group vitamins: Secondary | ICD-10-CM | POA: Diagnosis not present

## 2021-02-03 DIAGNOSIS — G4700 Insomnia, unspecified: Secondary | ICD-10-CM | POA: Diagnosis not present

## 2021-02-03 DIAGNOSIS — R0989 Other specified symptoms and signs involving the circulatory and respiratory systems: Secondary | ICD-10-CM | POA: Diagnosis not present

## 2021-02-03 DIAGNOSIS — I10 Essential (primary) hypertension: Secondary | ICD-10-CM | POA: Diagnosis not present

## 2021-02-03 DIAGNOSIS — G20 Parkinson's disease: Secondary | ICD-10-CM | POA: Diagnosis not present

## 2021-02-03 DIAGNOSIS — R600 Localized edema: Secondary | ICD-10-CM | POA: Diagnosis not present

## 2021-02-03 DIAGNOSIS — E785 Hyperlipidemia, unspecified: Secondary | ICD-10-CM | POA: Diagnosis not present

## 2021-02-03 DIAGNOSIS — E559 Vitamin D deficiency, unspecified: Secondary | ICD-10-CM | POA: Diagnosis not present

## 2021-02-04 DIAGNOSIS — R601 Generalized edema: Secondary | ICD-10-CM | POA: Diagnosis not present

## 2021-02-16 DIAGNOSIS — D518 Other vitamin B12 deficiency anemias: Secondary | ICD-10-CM | POA: Diagnosis not present

## 2021-02-16 DIAGNOSIS — E038 Other specified hypothyroidism: Secondary | ICD-10-CM | POA: Diagnosis not present

## 2021-02-16 DIAGNOSIS — E119 Type 2 diabetes mellitus without complications: Secondary | ICD-10-CM | POA: Diagnosis not present

## 2021-02-16 DIAGNOSIS — E559 Vitamin D deficiency, unspecified: Secondary | ICD-10-CM | POA: Diagnosis not present

## 2021-02-16 DIAGNOSIS — I10 Essential (primary) hypertension: Secondary | ICD-10-CM | POA: Diagnosis not present

## 2021-02-16 DIAGNOSIS — E785 Hyperlipidemia, unspecified: Secondary | ICD-10-CM | POA: Diagnosis not present

## 2021-02-25 DIAGNOSIS — R200 Anesthesia of skin: Secondary | ICD-10-CM | POA: Diagnosis not present

## 2021-02-25 DIAGNOSIS — R413 Other amnesia: Secondary | ICD-10-CM | POA: Diagnosis not present

## 2021-02-25 DIAGNOSIS — G231 Progressive supranuclear ophthalmoplegia [Steele-Richardson-Olszewski]: Secondary | ICD-10-CM | POA: Diagnosis not present

## 2021-02-25 DIAGNOSIS — R262 Difficulty in walking, not elsewhere classified: Secondary | ICD-10-CM | POA: Diagnosis not present

## 2021-02-25 DIAGNOSIS — M25552 Pain in left hip: Secondary | ICD-10-CM | POA: Diagnosis not present

## 2021-03-03 DIAGNOSIS — E538 Deficiency of other specified B group vitamins: Secondary | ICD-10-CM | POA: Diagnosis not present

## 2021-03-03 DIAGNOSIS — G4700 Insomnia, unspecified: Secondary | ICD-10-CM | POA: Diagnosis not present

## 2021-03-03 DIAGNOSIS — G20 Parkinson's disease: Secondary | ICD-10-CM | POA: Diagnosis not present

## 2021-03-03 DIAGNOSIS — E559 Vitamin D deficiency, unspecified: Secondary | ICD-10-CM | POA: Diagnosis not present

## 2021-03-03 DIAGNOSIS — R600 Localized edema: Secondary | ICD-10-CM | POA: Diagnosis not present

## 2021-03-03 DIAGNOSIS — I10 Essential (primary) hypertension: Secondary | ICD-10-CM | POA: Diagnosis not present

## 2021-03-03 DIAGNOSIS — E785 Hyperlipidemia, unspecified: Secondary | ICD-10-CM | POA: Diagnosis not present

## 2021-03-13 DIAGNOSIS — E559 Vitamin D deficiency, unspecified: Secondary | ICD-10-CM | POA: Diagnosis not present

## 2021-03-13 DIAGNOSIS — I10 Essential (primary) hypertension: Secondary | ICD-10-CM | POA: Diagnosis not present

## 2021-03-13 DIAGNOSIS — D518 Other vitamin B12 deficiency anemias: Secondary | ICD-10-CM | POA: Diagnosis not present

## 2021-03-13 DIAGNOSIS — E038 Other specified hypothyroidism: Secondary | ICD-10-CM | POA: Diagnosis not present

## 2021-03-13 DIAGNOSIS — E785 Hyperlipidemia, unspecified: Secondary | ICD-10-CM | POA: Diagnosis not present

## 2021-03-13 DIAGNOSIS — E119 Type 2 diabetes mellitus without complications: Secondary | ICD-10-CM | POA: Diagnosis not present

## 2021-03-18 ENCOUNTER — Emergency Department
Admission: EM | Admit: 2021-03-18 | Discharge: 2021-03-18 | Disposition: A | Discharge status: Home / Self Care | Payer: Medicare Other | Attending: Emergency Medicine | Admitting: Emergency Medicine

## 2021-03-18 ENCOUNTER — Emergency Department: Payer: Medicare Other

## 2021-03-18 ENCOUNTER — Other Ambulatory Visit: Payer: Self-pay

## 2021-03-18 DIAGNOSIS — F0281 Dementia in other diseases classified elsewhere with behavioral disturbance: Secondary | ICD-10-CM | POA: Insufficient documentation

## 2021-03-18 DIAGNOSIS — G20 Parkinson's disease: Secondary | ICD-10-CM | POA: Insufficient documentation

## 2021-03-18 DIAGNOSIS — N390 Urinary tract infection, site not specified: Secondary | ICD-10-CM | POA: Insufficient documentation

## 2021-03-18 DIAGNOSIS — I10 Essential (primary) hypertension: Secondary | ICD-10-CM | POA: Diagnosis not present

## 2021-03-18 DIAGNOSIS — R4182 Altered mental status, unspecified: Secondary | ICD-10-CM | POA: Diagnosis not present

## 2021-03-18 DIAGNOSIS — R0902 Hypoxemia: Secondary | ICD-10-CM | POA: Diagnosis not present

## 2021-03-18 DIAGNOSIS — Z743 Need for continuous supervision: Secondary | ICD-10-CM | POA: Diagnosis not present

## 2021-03-18 DIAGNOSIS — Z79899 Other long term (current) drug therapy: Secondary | ICD-10-CM | POA: Diagnosis not present

## 2021-03-18 DIAGNOSIS — B9689 Other specified bacterial agents as the cause of diseases classified elsewhere: Secondary | ICD-10-CM | POA: Insufficient documentation

## 2021-03-18 DIAGNOSIS — R410 Disorientation, unspecified: Secondary | ICD-10-CM | POA: Diagnosis not present

## 2021-03-18 LAB — COMPREHENSIVE METABOLIC PANEL
ALT: 12 U/L (ref 0–44)
AST: 13 U/L — ABNORMAL LOW (ref 15–41)
Albumin: 4.1 g/dL (ref 3.5–5.0)
Alkaline Phosphatase: 49 U/L (ref 38–126)
Anion gap: 9 (ref 5–15)
BUN: 22 mg/dL (ref 8–23)
CO2: 28 mmol/L (ref 22–32)
Calcium: 9.2 mg/dL (ref 8.9–10.3)
Chloride: 103 mmol/L (ref 98–111)
Creatinine, Ser: 0.61 mg/dL (ref 0.44–1.00)
GFR, Estimated: >60 mL/min (ref >60)
Glucose, Bld: 96 mg/dL (ref 70–99)
Potassium: 4.1 mmol/L (ref 3.5–5.1)
Sodium: 140 mmol/L (ref 135–145)
Total Bilirubin: 0.9 mg/dL (ref 0.3–1.2)
Total Protein: 6.5 g/dL (ref 6.5–8.1)

## 2021-03-18 LAB — CBC WITH DIFFERENTIAL/PLATELET
Abs Immature Granulocytes: 0.01 10*3/uL (ref 0.00–0.07)
Basophils Absolute: 0 10*3/uL (ref 0.0–0.1)
Basophils Relative: 0 %
Eosinophils Absolute: 0 10*3/uL (ref 0.0–0.5)
Eosinophils Relative: 0 %
HCT: 38 % (ref 36.0–46.0)
Hemoglobin: 12.7 g/dL (ref 12.0–15.0)
Immature Granulocytes: 0 %
Lymphocytes Relative: 25 %
Lymphs Abs: 1.7 10*3/uL (ref 0.7–4.0)
MCH: 29.5 pg (ref 26.0–34.0)
MCHC: 33.4 g/dL (ref 30.0–36.0)
MCV: 88.4 fL (ref 80.0–100.0)
Monocytes Absolute: 0.4 10*3/uL (ref 0.1–1.0)
Monocytes Relative: 6 %
Neutro Abs: 4.9 10*3/uL (ref 1.7–7.7)
Neutrophils Relative %: 69 %
Platelets: 163 10*3/uL (ref 150–400)
RBC: 4.3 MIL/uL (ref 3.87–5.11)
RDW: 13 % (ref 11.5–15.5)
WBC: 7.1 10*3/uL (ref 4.0–10.5)
nRBC: 0 % (ref 0.0–0.2)

## 2021-03-18 LAB — URINALYSIS, COMPLETE (UACMP) WITH MICROSCOPIC
Bacteria, UA: NONE SEEN
Bilirubin Urine: NEGATIVE
Glucose, UA: NEGATIVE mg/dL
Hgb urine dipstick: NEGATIVE
Ketones, ur: 5 mg/dL — AB
Nitrite: NEGATIVE
Protein, ur: NEGATIVE mg/dL
Specific Gravity, Urine: 1.025 (ref 1.005–1.030)
Squamous Epithelial / HPF: NONE SEEN (ref 0–5)
pH: 5 (ref 5.0–8.0)

## 2021-03-18 LAB — TROPONIN I (HIGH SENSITIVITY): Troponin I (High Sensitivity): 2 ng/L (ref <18)

## 2021-03-18 MED ORDER — CEPHALEXIN 250 MG/5ML PO SUSR: 500.0000 mg | Freq: Three times a day (TID) | ORAL | 0 refills | Status: DC

## 2021-03-18 MED ORDER — CEPHALEXIN 500 MG PO CAPS
500.0000 mg | ORAL_CAPSULE | Freq: Once | ORAL | Status: DC
  Filled 2021-03-18: qty 1

## 2021-03-18 MED ORDER — CEPHALEXIN 250 MG/5ML PO SUSR
500.0000 mg | Freq: Once | ORAL | Status: DC
  Filled 2021-03-18: qty 10

## 2021-03-18 MED ORDER — FOSFOMYCIN TROMETHAMINE 3 G PO PACK
3.0000 g | PACK | Freq: Once | ORAL | Status: AC
  Administered 2021-03-18: 3 g via ORAL
  Filled 2021-03-18: qty 3

## 2021-03-18 NOTE — ED Provider Notes (Signed)
[facility] Emergency Department Provider Note ____________________________________________   Event Date/Time   First MD Initiated Contact with Patient 03/18/21 1241     (approximate)  I have reviewed the triage vital signs and the nursing notes.  HISTORY  Chief Complaint Altered Mental Status   HPI Melissa Wise is a 70 y.o. femalewho presents to the ED for evaluation of decreased mentation.  Chart review indicates dementia.  Patient is brought to the ED for evaluation by her sister and legal guardian due to decreased mentation for the past couple days.  Sister reports concern for medication-related side effects due to Abilify being added a couple months ago to the patient's regimen.  The dosing has been decreased from 15-10-5.  Patient is been on 5 mg for a few weeks now.  Sister reports seeing the patient 2 days ago, and she seemed okay then, but got a call this morning from the facility that she was "unresponsive" or difficult to arouse.  Due to this, she was brought to the ED for evaluation.  Patient is nonverbal and unable to provide any relevant history to me.   Past Medical History:  Diagnosis Date  . Allergy   . Ataxia   . Dementia ([place])   . GERD (gastroesophageal reflux disease)   . Hyperglycemia   . Hyperlipidemia   . Hypertension   . Insomnia   . Low back pain     Patient Active Problem List   Diagnosis Date Noted  . Depression, recurrent ([place]) 12/19/2018  . Peripheral edema 10/04/2016  . Parkinson disease ([place]) 09/03/2016  . Hypertension 02/04/2016  . Hyperlipidemia 02/04/2016    Past Surgical History:  Procedure Laterality Date  . ABDOMINAL HYSTERECTOMY  1991   Complete, no cancer  . CATARACT EXTRACTION Left   . SHOULDER SURGERY Right 2015    Prior to Admission medications   Medication Sig Start Date End Date Taking? Authorizing Provider  ARIPiprazole (ABILIFY) 5 MG tablet Take 5 mg by mouth daily. 03/05/21  Yes  [provider]  carbidopa-levodopa (SINEMET IR) 25-100 MG tablet  10/07/16  Yes [provider]  COCONUT OIL PO Take by mouth.   Yes [provider]  Coenzyme Q10 (CO Q 10) 10 MG CAPS Take 1 capsule by mouth daily. 03/06/21  Yes [provider]  FIBER ADULT GUMMIES PO Take by mouth.   Yes [provider]  fish oil-omega-3 fatty acids 1000 MG capsule Take 1 tablet by mouth daily.   Yes [provider]  furosemide (LASIX) 20 MG tablet Take 20 mg by mouth daily. 02/23/21  Yes [provider]  lovastatin (MEVACOR) 40 MG tablet TAKE 1 TABLET BY MOUTH AT BEDTIME 10/15/19  Yes Johnson, Megan P, DO  melatonin 5 MG TABS Take 5 mg by mouth at bedtime. 02/23/21  Yes [provider]  Multiple Vitamin (MULTIVITAMIN) tablet Take 1 tablet by mouth daily.   Yes [provider]  Omega-3 Fatty Acids (FISH OIL) 1000 MG CAPS Take 300 mg by mouth.    Yes [provider]  rivastigmine (EXELON) 9.5 mg/24hr 9.5 mg daily. 12/12/19  Yes [provider]  TURMERIC PO Take by mouth.   Yes [provider]  vitamin B-12 (CYANOCOBALAMIN) 500 MCG tablet Take 500 mcg by mouth daily. 02/23/21  Yes [provider]  cephALEXin (KEFLEX) 250 MG/5ML suspension Take 10 mLs (500 mg total) by mouth 3 (three) times daily for 5 days. 03/18/21 03/23/21  Vladimir Crofts, MD  QUEtiapine (SEROQUEL) 100 MG tablet Take 100 mg by mouth at bedtime.  Patient not taking: No sig reported    [provider]  rivastigmine (EXELON) 4.6 mg/24hr rivastigmine 4.6 mg/24 hour transdermal patch  APPLY 1 PATCH EXTERNALLY TO THE SKIN DAILY Patient not taking: No sig reported    [provider]  terazosin (HYTRIN) 1 MG capsule Take 1 mg by mouth at bedtime.  06/28/19 06/27/20  [provider]    Allergies Amlodipine and Enalapril  Family History  Problem Relation Age of Onset  . Cancer Mother   . Hypertension Father   .  Heart attack Father   . Diabetes Maternal Grandmother   . Cancer Paternal Grandmother        Stomach    Social History Social History   Tobacco Use  . Smoking status: Never Smoker  . Smokeless tobacco: Never Used  Vaping Use  . Vaping Use: Never used  Substance Use Topics  . Alcohol use: Yes    Alcohol/week: 1.0 standard drink    Types: 1 Glasses of wine per week    Comment: occassionally  . Drug use: No    Review of Systems  Unable to be accurately assessed due to dementia ____________________________________________   PHYSICAL EXAM:  VITAL SIGNS: Vitals:   03/18/21 1330 03/18/21 1430  BP: (!) 121/91 132/76  Pulse: 69 65  Resp: (!) 21 13  Temp:    SpO2: 100% 100%     Constitutional: Alert and oriented. Well appearing and in no acute distress. Eyes: Conjunctivae are normal. PERRL. EOMI. Head: Atraumatic. Nose: No congestion/rhinnorhea. Mouth/Throat: Mucous membranes are moist.  Oropharynx non-erythematous. Neck: No stridor. No cervical spine tenderness to palpation. Cardiovascular: Normal rate, regular rhythm. Grossly normal heart sounds.  Good peripheral circulation. Respiratory: Normal respiratory effort.  No retractions. Lungs CTAB. Gastrointestinal: Soft , nondistended, nontender to palpation. No CVA tenderness. Musculoskeletal: No lower extremity tenderness nor edema.  No joint effusions. No signs of acute trauma. Neurologic:  No gross focal neurologic deficits are appreciated.  No clonus. Skin:  Skin is warm, dry and intact. No rash noted. Psychiatric: Mood and affect are difficult to assess as she is nonverbal..  ____________________________________________   LABS (all labs ordered are listed, but only abnormal results are displayed)  Labs Reviewed  COMPREHENSIVE METABOLIC PANEL - Abnormal; Notable for the following components:      Result Value   AST 13 (*)    All other components within normal limits  URINALYSIS, COMPLETE (UACMP) WITH  MICROSCOPIC - Abnormal; Notable for the following components:   Color, Urine YELLOW (*)    APPearance CLEAR (*)    Ketones, ur 5 (*)    Leukocytes,Ua MODERATE (*)    All other components within normal limits  URINE CULTURE  CBC WITH DIFFERENTIAL/PLATELET  TROPONIN I (HIGH SENSITIVITY)   ____________________________________________  12 Lead EKG  Sinus rhythm, rate of 81 bpm.  Wandering baseline and poor quality EKG to multiple leads.  Apparently normal intervals and axis.  No evidence of acute ischemia. ____________________________________________  RADIOLOGY  ED MD interpretation: CT head reviewed by me without evidence of acute intracranial pathology.  Official radiology report(s): CT Head Wo Contrast  Result Date: 03/18/2021 CLINICAL DATA:  Altered mental status EXAM: CT HEAD WITHOUT CONTRAST TECHNIQUE: Contiguous axial images were obtained from the base of the skull through the vertex without intravenous contrast. COMPARISON:  August 15, 2020 FINDINGS: Brain: Age related volume loss is stable. There is no intracranial mass, hemorrhage,  extra-axial fluid collection, or midline shift. Brain parenchyma appears unremarkable. No acute infarct evident. Vascular: No hyperdense vessel. There is calcification in each carotid siphon region. Skull: Bony calvarium appears intact. Sinuses/Orbits: Paranasal sinuses are clear. Patient is status post cataract removal on the left. Orbits otherwise appear symmetric bilaterally. Other: Mastoid air cells are clear. IMPRESSION: Normal appearing brain parenchyma. No evident acute infarct. No mass or hemorrhage. Foci of arterial vascular calcification noted. Electronically Signed   By: Lowella Grip III M.D.   On: 03/18/2021 13:44    ____________________________________________   PROCEDURES and INTERVENTIONS  Procedure(s) performed (including Critical Care):  .1-3 Lead EKG Interpretation Performed by: Vladimir Crofts, MD Authorized by: Vladimir Crofts,  MD     Interpretation: normal     ECG rate:  66   ECG rate assessment: normal     Rhythm: sinus rhythm     Ectopy: none     Conduction: normal      Medications  cephALEXin (KEFLEX) 250 MG/5ML suspension 500 mg (has no administration in time range)    ____________________________________________   MDM / ED COURSE   70 year old female with severe dementia past to the ED with decreased mentation for the past couple days, with evidence of possible acute cystitis contributing to her symptoms and amenable to return to facility.  Normal vitals.  Exam without evidence of distress, neurologic or vascular deficits.  When I evaluate the patient with sister at the bedside, sister indicates that she looks normal and seems okay at this point.  Her blood work is benign and CT head demonstrates no evidence of ICH or CVA.  UA with evidence of acute cystitis as a possible source of her symptoms.  I discussed empiric treatment with sister, who is in agreement.  We will start antibiotics here and discharged with a 5-day course of Keflex.  Return precautions for the ED were discussed.   Clinical Course as of 03/18/21 1534  Wed Mar 18, 2021  1456 Reassessed.  Discussed urinalysis results with sister and we discussed course of antibiotics and return to facility.  She is agreeable.  Answered questions. [DS]    Clinical Course User Index [DS] Vladimir Crofts, MD    ____________________________________________   FINAL CLINICAL IMPRESSION(S) / ED DIAGNOSES  Final diagnoses:  Altered mental status, unspecified altered mental status type  Lower urinary tract infectious disease     ED Discharge Orders         Ordered    cephALEXin (KEFLEX) 250 MG/5ML suspension  3 times daily,   Status:  Discontinued        03/18/21 1525    cephALEXin (KEFLEX) 250 MG/5ML suspension  3 times daily        03/18/21 1533           Jaydee Ingman   Note:  This document was prepared using TEFL teacher and may include unintentional dictation errors.   Vladimir Crofts, MD 03/18/21 1536

## 2021-03-18 NOTE — ED Notes (Signed)
EDP at bedside  

## 2021-03-18 NOTE — ED Notes (Signed)
Sister left room. Waiting for sister to return to sign for discharge, help get pt into wheelchair and discharge with sister who will bring pt to [place].

## 2021-03-18 NOTE — ED Notes (Signed)
EDP informed pt was not able to swallow keflex capsule.

## 2021-03-18 NOTE — ED Notes (Signed)
Called pharmacy re: missing dose of cephalexin suspension. They will send.

## 2021-03-18 NOTE — ED Notes (Signed)
Unable to reach person at [facility] LM on general VM mailbox with this nurse's number to call back.

## 2021-03-18 NOTE — ED Notes (Signed)
Called lab to confirm they will add on urine culture to previous collection. They will run culture on same specimen. Spoke with [facility].

## 2021-03-18 NOTE — Discharge Instructions (Signed)
Melissa Wise was started on a course of antibiotics to treat a UTI that could be contributing to her decreased mentation for the past couple days.  Please finish this course of antibiotics, 3 times a day for the next 5 days.  Continue her other medications, including her Abilify 5 mg.  If she develops any further worsening symptoms, please return to the ED for evaluation.

## 2021-03-18 NOTE — ED Notes (Signed)
Sister provided other number for Tammy (resident Glass blower/designer) at [employer].

## 2021-03-18 NOTE — ED Triage Notes (Addendum)
Pt to ED via [place] from [facility] Pt has baseline dementia and sister is legal guardian. Sister is aware and on way to hospital Staff called EMS this morning because pt had brief spell of "unresponsiveness". Pt slept through breakfast and has been sleeping more than usual since Abilify dose was changed on 4/28 from 10mg  to 5mg  Pt usually walks and talks but per staff at [facility] has been hardly talking or interacting with other residents EMS states that SPO2 was 78-82% on RA upon their arrival; came up to 90% on 3L oxygen [place]  Pt now 100% SPO2 on RA, [place] removed  Other EMS VS: ETCO2 41 HR 86 BP 149/73 CBG 115  Pt in NAD, oriented to self only

## 2021-03-18 NOTE — ED Notes (Signed)
Sister [name], [place] at bedside.  MSE waiver will be signed by POA.

## 2021-03-20 LAB — URINE CULTURE: Culture: NO GROWTH

## 2021-03-31 DIAGNOSIS — R600 Localized edema: Secondary | ICD-10-CM | POA: Diagnosis not present

## 2021-03-31 DIAGNOSIS — E785 Hyperlipidemia, unspecified: Secondary | ICD-10-CM | POA: Diagnosis not present

## 2021-03-31 DIAGNOSIS — G20 Parkinson's disease: Secondary | ICD-10-CM | POA: Diagnosis not present

## 2021-03-31 DIAGNOSIS — E559 Vitamin D deficiency, unspecified: Secondary | ICD-10-CM | POA: Diagnosis not present

## 2021-03-31 DIAGNOSIS — E538 Deficiency of other specified B group vitamins: Secondary | ICD-10-CM | POA: Diagnosis not present

## 2021-03-31 DIAGNOSIS — G4700 Insomnia, unspecified: Secondary | ICD-10-CM | POA: Diagnosis not present

## 2021-04-14 ENCOUNTER — Emergency Department: Payer: Medicare Other

## 2021-04-14 ENCOUNTER — Other Ambulatory Visit: Payer: Self-pay

## 2021-04-14 ENCOUNTER — Encounter: Payer: Self-pay | Admitting: Emergency Medicine

## 2021-04-14 ENCOUNTER — Inpatient Hospital Stay
Admission: EM | Admit: 2021-04-14 | Discharge: 2021-04-20 | DRG: 092 | Disposition: A | Discharge status: Skilled Nursing Facility | Payer: Medicare Other | Source: Skilled Nursing Facility | Attending: Internal Medicine | Admitting: Internal Medicine

## 2021-04-14 DIAGNOSIS — I499 Cardiac arrhythmia, unspecified: Secondary | ICD-10-CM | POA: Diagnosis not present

## 2021-04-14 DIAGNOSIS — Z7984 Long term (current) use of oral hypoglycemic drugs: Secondary | ICD-10-CM | POA: Diagnosis not present

## 2021-04-14 DIAGNOSIS — E538 Deficiency of other specified B group vitamins: Secondary | ICD-10-CM | POA: Diagnosis present

## 2021-04-14 DIAGNOSIS — Z20822 Contact with and (suspected) exposure to covid-19: Secondary | ICD-10-CM | POA: Diagnosis not present

## 2021-04-14 DIAGNOSIS — Z66 Do not resuscitate: Secondary | ICD-10-CM | POA: Diagnosis present

## 2021-04-14 DIAGNOSIS — R4182 Altered mental status, unspecified: Secondary | ICD-10-CM | POA: Diagnosis not present

## 2021-04-14 DIAGNOSIS — Z833 Family history of diabetes mellitus: Secondary | ICD-10-CM | POA: Diagnosis not present

## 2021-04-14 DIAGNOSIS — R5381 Other malaise: Secondary | ICD-10-CM | POA: Diagnosis not present

## 2021-04-14 DIAGNOSIS — E785 Hyperlipidemia, unspecified: Secondary | ICD-10-CM | POA: Diagnosis present

## 2021-04-14 DIAGNOSIS — I491 Atrial premature depolarization: Secondary | ICD-10-CM | POA: Diagnosis not present

## 2021-04-14 DIAGNOSIS — F0391 Unspecified dementia with behavioral disturbance: Secondary | ICD-10-CM | POA: Diagnosis present

## 2021-04-14 DIAGNOSIS — G20 Parkinson's disease: Secondary | ICD-10-CM | POA: Diagnosis not present

## 2021-04-14 DIAGNOSIS — Z888 Allergy status to other drugs, medicaments and biological substances status: Secondary | ICD-10-CM

## 2021-04-14 DIAGNOSIS — M6289 Other specified disorders of muscle: Secondary | ICD-10-CM | POA: Diagnosis not present

## 2021-04-14 DIAGNOSIS — Z743 Need for continuous supervision: Secondary | ICD-10-CM | POA: Diagnosis not present

## 2021-04-14 DIAGNOSIS — Z9842 Cataract extraction status, left eye: Secondary | ICD-10-CM

## 2021-04-14 DIAGNOSIS — G928 Other toxic encephalopathy: Secondary | ICD-10-CM | POA: Diagnosis present

## 2021-04-14 DIAGNOSIS — G9341 Metabolic encephalopathy: Secondary | ICD-10-CM | POA: Diagnosis not present

## 2021-04-14 DIAGNOSIS — D518 Other vitamin B12 deficiency anemias: Secondary | ICD-10-CM | POA: Diagnosis not present

## 2021-04-14 DIAGNOSIS — Z79899 Other long term (current) drug therapy: Secondary | ICD-10-CM

## 2021-04-14 DIAGNOSIS — Z87898 Personal history of other specified conditions: Secondary | ICD-10-CM | POA: Diagnosis not present

## 2021-04-14 DIAGNOSIS — Z8249 Family history of ischemic heart disease and other diseases of the circulatory system: Secondary | ICD-10-CM

## 2021-04-14 DIAGNOSIS — R531 Weakness: Secondary | ICD-10-CM | POA: Diagnosis not present

## 2021-04-14 DIAGNOSIS — Z809 Family history of malignant neoplasm, unspecified: Secondary | ICD-10-CM | POA: Diagnosis not present

## 2021-04-14 DIAGNOSIS — M6281 Muscle weakness (generalized): Secondary | ICD-10-CM | POA: Diagnosis not present

## 2021-04-14 DIAGNOSIS — R279 Unspecified lack of coordination: Secondary | ICD-10-CM | POA: Diagnosis not present

## 2021-04-14 DIAGNOSIS — I10 Essential (primary) hypertension: Secondary | ICD-10-CM | POA: Diagnosis present

## 2021-04-14 DIAGNOSIS — Z7189 Other specified counseling: Secondary | ICD-10-CM | POA: Diagnosis not present

## 2021-04-14 DIAGNOSIS — M47816 Spondylosis without myelopathy or radiculopathy, lumbar region: Secondary | ICD-10-CM | POA: Diagnosis not present

## 2021-04-14 DIAGNOSIS — K828 Other specified diseases of gallbladder: Secondary | ICD-10-CM | POA: Diagnosis not present

## 2021-04-14 DIAGNOSIS — N3000 Acute cystitis without hematuria: Secondary | ICD-10-CM | POA: Diagnosis not present

## 2021-04-14 DIAGNOSIS — E569 Vitamin deficiency, unspecified: Secondary | ICD-10-CM | POA: Diagnosis not present

## 2021-04-14 DIAGNOSIS — D519 Vitamin B12 deficiency anemia, unspecified: Secondary | ICD-10-CM | POA: Diagnosis not present

## 2021-04-14 DIAGNOSIS — X58XXXA Exposure to other specified factors, initial encounter: Secondary | ICD-10-CM | POA: Diagnosis present

## 2021-04-14 DIAGNOSIS — F0281 Dementia in other diseases classified elsewhere with behavioral disturbance: Secondary | ICD-10-CM | POA: Diagnosis not present

## 2021-04-14 DIAGNOSIS — Z90710 Acquired absence of both cervix and uterus: Secondary | ICD-10-CM

## 2021-04-14 DIAGNOSIS — E119 Type 2 diabetes mellitus without complications: Secondary | ICD-10-CM | POA: Diagnosis not present

## 2021-04-14 DIAGNOSIS — N390 Urinary tract infection, site not specified: Secondary | ICD-10-CM | POA: Diagnosis not present

## 2021-04-14 DIAGNOSIS — E559 Vitamin D deficiency, unspecified: Secondary | ICD-10-CM | POA: Diagnosis not present

## 2021-04-14 DIAGNOSIS — K219 Gastro-esophageal reflux disease without esophagitis: Secondary | ICD-10-CM | POA: Diagnosis present

## 2021-04-14 DIAGNOSIS — R404 Transient alteration of awareness: Secondary | ICD-10-CM | POA: Diagnosis not present

## 2021-04-14 DIAGNOSIS — Z736 Limitation of activities due to disability: Secondary | ICD-10-CM | POA: Diagnosis not present

## 2021-04-14 DIAGNOSIS — T43505A Adverse effect of unspecified antipsychotics and neuroleptics, initial encounter: Secondary | ICD-10-CM | POA: Diagnosis present

## 2021-04-14 DIAGNOSIS — R109 Unspecified abdominal pain: Secondary | ICD-10-CM | POA: Diagnosis not present

## 2021-04-14 DIAGNOSIS — G4700 Insomnia, unspecified: Secondary | ICD-10-CM | POA: Diagnosis not present

## 2021-04-14 DIAGNOSIS — E038 Other specified hypothyroidism: Secondary | ICD-10-CM | POA: Diagnosis not present

## 2021-04-14 DIAGNOSIS — Z79890 Hormone replacement therapy (postmenopausal): Secondary | ICD-10-CM

## 2021-04-14 DIAGNOSIS — R2681 Unsteadiness on feet: Secondary | ICD-10-CM | POA: Diagnosis not present

## 2021-04-14 LAB — URINALYSIS, COMPLETE (UACMP) WITH MICROSCOPIC
Bacteria, UA: NONE SEEN
Bilirubin Urine: NEGATIVE
Glucose, UA: NEGATIVE mg/dL
Hgb urine dipstick: NEGATIVE
Ketones, ur: NEGATIVE mg/dL
Nitrite: NEGATIVE
Protein, ur: NEGATIVE mg/dL
Specific Gravity, Urine: 1.013 (ref 1.005–1.030)
pH: 7 (ref 5.0–8.0)

## 2021-04-14 LAB — COMPREHENSIVE METABOLIC PANEL
ALT: 9 U/L (ref 0–44)
AST: 20 U/L (ref 15–41)
Albumin: 4 g/dL (ref 3.5–5.0)
Alkaline Phosphatase: 63 U/L (ref 38–126)
Anion gap: 4 — ABNORMAL LOW (ref 5–15)
BUN: 17 mg/dL (ref 8–23)
CO2: 30 mmol/L (ref 22–32)
Calcium: 8.9 mg/dL (ref 8.9–10.3)
Chloride: 103 mmol/L (ref 98–111)
Creatinine, Ser: 0.6 mg/dL (ref 0.44–1.00)
GFR, Estimated: >60 mL/min (ref >60)
Glucose, Bld: 99 mg/dL (ref 70–99)
Potassium: 3.9 mmol/L (ref 3.5–5.1)
Sodium: 137 mmol/L (ref 135–145)
Total Bilirubin: 0.9 mg/dL (ref 0.3–1.2)
Total Protein: 6.7 g/dL (ref 6.5–8.1)

## 2021-04-14 LAB — CBC WITH DIFFERENTIAL/PLATELET
Abs Immature Granulocytes: 0.02 10*3/uL (ref 0.00–0.07)
Basophils Absolute: 0 10*3/uL (ref 0.0–0.1)
Basophils Relative: 0 %
Eosinophils Absolute: 0 10*3/uL (ref 0.0–0.5)
Eosinophils Relative: 0 %
HCT: 37.1 % (ref 36.0–46.0)
Hemoglobin: 12.7 g/dL (ref 12.0–15.0)
Immature Granulocytes: 0 %
Lymphocytes Relative: 27 %
Lymphs Abs: 1.8 10*3/uL (ref 0.7–4.0)
MCH: 29.7 pg (ref 26.0–34.0)
MCHC: 34.2 g/dL (ref 30.0–36.0)
MCV: 86.7 fL (ref 80.0–100.0)
Monocytes Absolute: 0.4 10*3/uL (ref 0.1–1.0)
Monocytes Relative: 6 %
Neutro Abs: 4.5 10*3/uL (ref 1.7–7.7)
Neutrophils Relative %: 67 %
Platelets: 172 10*3/uL (ref 150–400)
RBC: 4.28 MIL/uL (ref 3.87–5.11)
RDW: 13.1 % (ref 11.5–15.5)
WBC: 6.7 10*3/uL (ref 4.0–10.5)
nRBC: 0 % (ref 0.0–0.2)

## 2021-04-14 LAB — TROPONIN I (HIGH SENSITIVITY)
Troponin I (High Sensitivity): <2 ng/L (ref <18)
Troponin I (High Sensitivity): 3 ng/L (ref <18)

## 2021-04-14 LAB — LIPASE, BLOOD: Lipase: 29 U/L (ref 11–51)

## 2021-04-14 LAB — MRSA PCR SCREENING: MRSA by PCR: NEGATIVE

## 2021-04-14 MED ORDER — CARBIDOPA-LEVODOPA 25-100 MG PO TABS
1.0000 | ORAL_TABLET | Freq: Three times a day (TID) | ORAL | Status: DC
  Administered 2021-04-14 – 2021-04-20 (×17): 1 via ORAL
  Filled 2021-04-14 (×20): qty 1

## 2021-04-14 MED ORDER — SODIUM CHLORIDE 0.9 % IV SOLN
1.0000 g | Freq: Once | INTRAVENOUS | Status: AC
  Administered 2021-04-14: 1 g via INTRAVENOUS
  Filled 2021-04-14: qty 10

## 2021-04-14 MED ORDER — SODIUM CHLORIDE 0.9 % IV SOLN: INTRAVENOUS | Status: DC

## 2021-04-14 MED ORDER — VITAMIN D 25 MCG (1000 UNIT) PO TABS
2000.0000 [IU] | ORAL_TABLET | Freq: Every day | ORAL | Status: DC
  Administered 2021-04-15 – 2021-04-19 (×5): 2000 [IU] via ORAL
  Filled 2021-04-14 (×6): qty 2

## 2021-04-14 MED ORDER — PRAVASTATIN SODIUM 20 MG PO TABS
40.0000 mg | ORAL_TABLET | Freq: Every day | ORAL | Status: DC
  Administered 2021-04-15 – 2021-04-19 (×5): 40 mg via ORAL
  Filled 2021-04-14 (×5): qty 2

## 2021-04-14 MED ORDER — TURMERIC 500 MG PO CAPS: 500.0000 mg | ORAL_CAPSULE | Freq: Every day | ORAL | Status: DC

## 2021-04-14 MED ORDER — OMEGA-3 FATTY ACIDS 1000 MG PO CAPS: 1.0000 g | ORAL_CAPSULE | Freq: Every day | ORAL | Status: DC

## 2021-04-14 MED ORDER — ACETAMINOPHEN 650 MG RE SUPP
650.0000 mg | Freq: Four times a day (QID) | RECTAL | Status: DC | PRN
Start: 2021-04-14 — End: 2021-04-20

## 2021-04-14 MED ORDER — MELATONIN 5 MG PO TABS
5.0000 mg | ORAL_TABLET | Freq: Every day | ORAL | Status: DC
  Administered 2021-04-14 – 2021-04-19 (×6): 5 mg via ORAL
  Filled 2021-04-14 (×7): qty 1

## 2021-04-14 MED ORDER — ACETAMINOPHEN 325 MG PO TABS: 650.0000 mg | ORAL_TABLET | Freq: Four times a day (QID) | ORAL | Status: DC | PRN

## 2021-04-14 MED ORDER — CO Q 10 10 MG PO CAPS: 1.0000 | ORAL_CAPSULE | Freq: Every day | ORAL | Status: DC

## 2021-04-14 MED ORDER — OMEGA-3-ACID ETHYL ESTERS 1 G PO CAPS
1.0000 g | ORAL_CAPSULE | Freq: Every day | ORAL | Status: DC
  Administered 2021-04-15 – 2021-04-19 (×5): 1 g via ORAL
  Filled 2021-04-14 (×5): qty 1

## 2021-04-14 MED ORDER — ONDANSETRON HCL 4 MG PO TABS: 4.0000 mg | ORAL_TABLET | Freq: Four times a day (QID) | ORAL | Status: DC | PRN

## 2021-04-14 MED ORDER — CYANOCOBALAMIN 500 MCG PO TABS
500.0000 ug | ORAL_TABLET | Freq: Every day | ORAL | Status: DC
  Administered 2021-04-15 – 2021-04-19 (×5): 500 ug via ORAL
  Filled 2021-04-14 (×7): qty 1

## 2021-04-14 MED ORDER — SODIUM CHLORIDE 0.9 % IV BOLUS
500.0000 mL | Freq: Once | INTRAVENOUS | Status: AC
  Administered 2021-04-14: 500 mL via INTRAVENOUS

## 2021-04-14 MED ORDER — RIVASTIGMINE 9.5 MG/24HR TD PT24
9.5000 mg | MEDICATED_PATCH | Freq: Every day | TRANSDERMAL | Status: DC
  Administered 2021-04-15: 9.5 mg via TRANSDERMAL
  Filled 2021-04-14 (×2): qty 1

## 2021-04-14 MED ORDER — ADULT MULTIVITAMIN W/MINERALS CH
1.0000 | ORAL_TABLET | Freq: Every day | ORAL | Status: DC
  Administered 2021-04-15 – 2021-04-19 (×5): 1 via ORAL
  Filled 2021-04-14 (×6): qty 1

## 2021-04-14 MED ORDER — ARIPIPRAZOLE 5 MG PO TABS
5.0000 mg | ORAL_TABLET | Freq: Every day | ORAL | Status: DC
  Filled 2021-04-14 (×2): qty 1

## 2021-04-14 MED ORDER — ENOXAPARIN SODIUM 40 MG/0.4ML IJ SOSY
40.0000 mg | PREFILLED_SYRINGE | INTRAMUSCULAR | Status: DC
  Administered 2021-04-14 – 2021-04-19 (×6): 40 mg via SUBCUTANEOUS
  Filled 2021-04-14 (×6): qty 0.4

## 2021-04-14 MED ORDER — TRAZODONE HCL 50 MG PO TABS
25.0000 mg | ORAL_TABLET | Freq: Every evening | ORAL | Status: DC | PRN
  Administered 2021-04-19: 25 mg via ORAL
  Filled 2021-04-14: qty 1

## 2021-04-14 MED ORDER — MAGNESIUM HYDROXIDE 400 MG/5ML PO SUSP: 30.0000 mL | Freq: Every day | ORAL | Status: DC | PRN

## 2021-04-14 MED ORDER — ONDANSETRON HCL 4 MG/2ML IJ SOLN: 4.0000 mg | Freq: Four times a day (QID) | INTRAMUSCULAR | Status: DC | PRN

## 2021-04-14 NOTE — Plan of Care (Signed)
  Problem: Education: Goal: Knowledge of General Education information will improve Description: Including pain rating scale, medication(s)/side effects and non-pharmacologic comfort measures Outcome: Not Progressing Note: Patient is confused. Patient profile completed via sister. Skin intact. No complaints of pain.

## 2021-04-14 NOTE — H&P (Addendum)
[facility]   PATIENT NAME: Melissa Wise    MR#:  [id number]  DATE OF BIRTH:  [birth year]  DATE OF ADMISSION:  04/14/2021  PRIMARY CARE PHYSICIAN: Pcp, No   Patient is coming from: [facility]  REQUESTING/REFERRING PHYSICIAN: Blake Divine, MD  CHIEF COMPLAINT:   Chief Complaint  Patient presents with  . Altered Mental Status    HISTORY OF PRESENT ILLNESS:  Melissa Wise is a 71 y.o. 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  She was reportedly walking with her eyes closed.  She was responding to sternal rub.  She reportedly had abdominal pain without specific localization.  According to the patient's sister, the patient has been more withdrawn lately and less interactive with both family and staff.  No reported fever or chills.  No reported nausea or vomiting or abdominal pain.  No chest pain or palpitations.  No reported cough or wheezing or dyspnea.  She did not have any reported dysuria, urinary frequency or urgency or flank pain.  The patient is a very poor historian due to her advanced dementia.  No new paresthesias or focal muscle weakness.  ED Course: Upon presentation to the emergency room, vital signs were within normal.  Labs were unremarkable.  Troponin I was less than 2 and later 3.  UA showed 21-50 WBCs with 6-10 RBCs.  Urine culture was sent.   EKG as reviewed by me : Showed normal sinus rhythm with a rate of 85 with short PR interval with premature supraventricular complexes Imaging: Noncontrasted head CT scan revealed generalized cerebral atrophy with no acute intracranial abnormalities.  Abdominal pelvic CT scan revealed gallbladder sludge without cholelithiasis or acute  cholecystitis, suspected simple left renal cyst and asymmetric right psoas muscle enlargement with indeterminate age.  This is not a specific finding and may be chronic in nature.  Two-view chest x-ray showed no acute cardiopulmonary disease.  The patient was given 1 g of IV Rocephin and 500 mill IV normal saline bolus.  She will be admitted to a medical bed for further evaluation and management.  PAST MEDICAL HISTORY:   Past Medical History:  Diagnosis Date  . Allergy   . Ataxia   . Dementia ([place])   . GERD (gastroesophageal reflux disease)   . Hyperglycemia   . Hyperlipidemia   . Hypertension   . Insomnia   . Low back pain   -Parkinson's disease  PAST SURGICAL HISTORY:   Past Surgical History:  Procedure Laterality Date  . ABDOMINAL HYSTERECTOMY  1991   Complete, no cancer  . CATARACT EXTRACTION Left   . SHOULDER SURGERY Right 2015    SOCIAL HISTORY:   Social History   Tobacco Use  . Smoking status: Never Smoker  . Smokeless tobacco: Never Used  Substance Use Topics  . Alcohol use: Yes    Alcohol/week: 1.0 standard drink    Types: 1 Glasses of wine per week    Comment: occassionally    FAMILY HISTORY:   Family History  Problem Relation Age of Onset  . Cancer Mother   . Hypertension Father   . Heart attack Father   . Diabetes Maternal Grandmother   . Cancer Paternal Grandmother        Stomach    DRUG ALLERGIES:  Allergies  Allergen Reactions  . Amlodipine Swelling  . Enalapril Cough    REVIEW OF SYSTEMS:   ROS As per history of present illness. All pertinent systems were reviewed above. Constitutional, HEENT, cardiovascular, respiratory, GI, GU, musculoskeletal, neuro, psychiatric, endocrine, integumentary and hematologic systems were reviewed and are otherwise negative/unremarkable except for positive findings mentioned above in the HPI.   MEDICATIONS AT HOME:   Prior to Admission medications   Medication Sig Start Date End Date Taking?  Authorizing Provider  ARIPiprazole (ABILIFY) 5 MG tablet Take 5 mg by mouth daily. 03/05/21  Yes [provider]  carbidopa-levodopa (SINEMET IR) 25-100 MG tablet Take 1 tablet by mouth 3 (three) times daily. 10/07/16  Yes [provider]  cholecalciferol (VITAMIN D3) 25 MCG (1000 UNIT) tablet Take 2,000 Units by mouth daily.   Yes [provider]  Coenzyme Q10 (CO Q 10) 10 MG CAPS Take 1 capsule by mouth daily. 03/06/21  Yes [provider]  FIBER ADULT GUMMIES PO Take by mouth.   Yes [provider]  fish oil-omega-3 fatty acids 1000 MG capsule Take 1 tablet by mouth daily.   Yes [provider]  furosemide (LASIX) 20 MG tablet Take 20 mg by mouth daily. 02/23/21  Yes [provider]  lovastatin (MEVACOR) 40 MG tablet TAKE 1 TABLET BY MOUTH AT BEDTIME Patient taking differently: Take 40 mg by mouth at bedtime. 10/15/19  Yes Johnson, Megan P, DO  melatonin 5 MG TABS Take 5 mg by mouth at bedtime. 02/23/21  Yes [provider]  Multiple Vitamin (MULTIVITAMIN) tablet Take 1 tablet by mouth daily.   Yes [provider]  rivastigmine (EXELON) 9.5 mg/24hr 9.5 mg daily. 12/12/19  Yes [provider]  TURMERIC PO Take 500 mg by mouth daily at 12 noon.   Yes [provider]  vitamin B-12 (CYANOCOBALAMIN) 500 MCG tablet Take 500 mcg by mouth daily. 02/23/21  Yes [provider]  COCONUT OIL PO Take by mouth. Patient not taking: Reported on 04/14/2021    [provider]      VITAL SIGNS:  Blood pressure 129/79, pulse 73, temperature 98.9 F (37.2 C), temperature source Oral, height 5\' 5"  (1.651 m), weight 59 kg, SpO2 100 %.  PHYSICAL EXAMINATION:  Physical Exam  GENERAL:  70 y.o.-year-old Caucasian female patient lying in the bed with no acute distress.  She was fairly somnolent but arousable.  She closes her eyes and would not open them.  She was alert only to her name. EYES: Pupils equal,  round, reactive to light and accommodation. No scleral icterus. Extraocular muscles intact.  HEENT: Head atraumatic, normocephalic. Oropharynx and nasopharynx clear.  NECK:  Supple, no jugular venous distention. No thyroid enlargement, no tenderness.  LUNGS: Normal breath sounds bilaterally, no wheezing, rales,rhonchi or crepitation. No use of accessory muscles of respiration.  CARDIOVASCULAR: Regular rate and rhythm, S1, S2 normal. No murmurs, rubs, or gallops.  ABDOMEN: Soft, nondistended, nontender. Bowel sounds present. No organomegaly or mass.  EXTREMITIES: 1+ ankle pitting edema with no cyanosis, or clubbing.  NEUROLOGIC: Cranial nerves II through XII are intact. Muscle strength 5/5 in all extremities. Sensation intact. Gait not checked.  PSYCHIATRIC: The patient is alert and oriented x 1 to her name only.  SKIN: No obvious rash, lesion, or ulcer.   LABORATORY PANEL:   CBC Recent Labs  Lab 04/14/21 1425  WBC 6.7  HGB 12.7  HCT 37.1  PLT 172   ------------------------------------------------------------------------------------------------------------------  Chemistries  Recent  Labs  Lab 04/14/21 1425  NA 137  K 3.9  CL 103  CO2 30  GLUCOSE 99  BUN 17  CREATININE 0.60  CALCIUM 8.9  AST 20  ALT 9  ALKPHOS 63  BILITOT 0.9   ------------------------------------------------------------------------------------------------------------------  Cardiac Enzymes No results for input(s): TROPONINI in the last 168 hours. ------------------------------------------------------------------------------------------------------------------  RADIOLOGY:  CT Abdomen Pelvis Wo Contrast  Result Date: 04/14/2021 CLINICAL DATA:  Abdominal pain with palpation. EXAM: CT ABDOMEN AND PELVIS WITHOUT CONTRAST TECHNIQUE: Multidetector CT imaging of the abdomen and pelvis was performed following the standard protocol without IV contrast. COMPARISON:  None. FINDINGS: Lower chest: No acute  abnormality. Hepatobiliary: No focal liver abnormality is seen. Heterogeneous sludge is suspected within the gallbladder without evidence of gallstones, gallbladder wall thickening, or biliary dilatation. Pancreas: Unremarkable. No pancreatic ductal dilatation or surrounding inflammatory changes. Spleen: Normal in size without focal abnormality. Adrenals/Urinary Tract: Adrenal glands are unremarkable. Kidneys are normal in size, without renal calculi or hydronephrosis. A 2.1 cm x 1.6 cm cystic appearing area is seen within the anterolateral aspect of the upper pole of the right kidney bladder is unremarkable. Stomach/Bowel: Stomach is within normal limits. Appendix appears normal. Large amount of stool is seen throughout the colon. No evidence of bowel wall thickening, distention, or inflammatory changes. Vascular/Lymphatic: Aortic atherosclerosis. No enlarged abdominal or pelvic lymph nodes. Reproductive: Status post hysterectomy. No adnexal masses. Other: No abdominal wall hernia or abnormality. No abdominopelvic ascites. Musculoskeletal: Asymmetric enlargement of the right psoas muscle is seen (axial CT images 28 through 46, CT series 3). This is of indeterminate age. No associated inflammatory fat stranding is identified. Multilevel degenerative changes are seen throughout the lumbar spine. IMPRESSION: 1. Gallbladder sludge without cholelithiasis or acute cholecystitis. 2. Suspected simple left renal cysts. 3. Asymmetric right psoas muscle enlargement of indeterminate age. This is a nonspecific finding may be chronic in nature. Electronically Signed   By: Virgina Norfolk M.D.   On: 04/14/2021 15:17   DG Chest 2 View  Result Date: 04/14/2021 CLINICAL DATA:  Weakness. EXAM: CHEST - 2 VIEW COMPARISON:  07/16/2016 FINDINGS: Heart size is normal. No pleural effusion or edema. No airspace opacities identified. Remote deformities involving the left scapula and distal left clavicle. There is also a remote deformity  involving the proximal aspect of the right humerus. IMPRESSION: No acute cardiopulmonary abnormalities. Electronically Signed   By: Kerby Moors M.D.   On: 04/14/2021 15:20   CT Head Wo Contrast  Result Date: 04/14/2021 CLINICAL DATA:  Altered mental status. EXAM: CT HEAD WITHOUT CONTRAST TECHNIQUE: Contiguous axial images were obtained from the base of the skull through the vertex without intravenous contrast. COMPARISON:  Mar 18, 2021 FINDINGS: Brain: There is mild cerebral atrophy with widening of the extra-axial spaces and ventricular dilatation. There are areas of decreased attenuation within the white matter tracts of the supratentorial brain, consistent with microvascular disease changes. Vascular: No hyperdense vessel or unexpected calcification. Skull: Normal. Negative for fracture or focal lesion. Sinuses/Orbits: No acute finding. Other: None. IMPRESSION: 1. Generalized cerebral atrophy. 2. No acute intracranial abnormality. Electronically Signed   By: Virgina Norfolk M.D.   On: 04/14/2021 15:07      IMPRESSION AND PLAN:  Active Problems:   UTI (urinary tract infection)  1.  Acute metabolic encephalopathy likely secondary to urinary tract infection. - The patient will be admitted to a medical bed. - Continue antibiotic therapy with IV Rocephin. - We will follow urine culture. - We will follow neurochecks  every 4 hours for 24 hours.  2.  Parkinson's disease. - We will continue Sinemet ER.  3.  Dementia with behavioral changes. - We will continue Exelon patch and Abilify.  4.  Dyslipidemia. - We will continue statin therapy and fish oil.  5.  Vitamin B12 deficiency. - We will continue vitamin B12.   DVT prophylaxis: Lovenox.  Code Status: full code. Family Communication:  The plan of care was discussed in details with the patient (and family). I answered all questions. The patient agreed to proceed with the above mentioned plan. Further management will depend upon  hospital course. Disposition Plan: Back to previous home environment Consults called: none. All the records are reviewed and case discussed with ED provider.  Status is: Inpatient  Remains inpatient appropriate because:Altered mental status, Ongoing diagnostic testing needed not appropriate for outpatient work up, Unsafe d/c plan, IV treatments appropriate due to intensity of illness or inability to take PO and Inpatient level of care appropriate due to severity of illness   Dispo: The patient is from: [facility] assisted living facility.              Anticipated d/c is to: [facility] assisted [facility] facility.              Patient currently is not medically stable to d/c.   Difficult to place patient No   TOTAL TIME TAKING CARE OF THIS PATIENT: 55 minutes.    Christel Mormon M.D on 04/14/2021 at 7:46 PM  [facility]   From 7 PM-7 AM, contact night-coverage [email or web address]  CC: Primary care physician; Pcp, No

## 2021-04-14 NOTE — ED Notes (Signed)
EDP [place] at bedside. May need to I&O cath pt if unable to provide urine sample soon. May require cup of water or small bolus; [place] notified.

## 2021-04-14 NOTE — ED Provider Notes (Signed)
[facility] Emergency Department Provider Note   ____________________________________________   Event Date/Time   First MD Initiated Contact with Patient 04/14/21 1403     (approximate)  I have reviewed the triage vital signs and the nursing notes.   HISTORY  Chief Complaint Altered Mental Status    HPI Melissa Wise is a 70 y.o. female with past medical history of dementia, progressive supranuclear palsy, hypertension, and hyperlipidemia who presents to the ED for altered mental status.  History is limited as patient appears to not want to participate in majority of assessment.  Per EMS, staff at patient's nursing facility were concerned that she was more "lethargic" than usual, reportedly "walking with her eyes closed."  Patient eventually opens eyes and starts answering questions following sternal rub.  She states that she has had some pain in her abdomen but is unable to localize this pain.  She denies any other symptoms and wants to be left alone.  Speaking with patient sister over the phone, patient has been more "withdrawn" lately, less interactive with both family and staff.        Past Medical History:  Diagnosis Date  . Allergy   . Ataxia   . Dementia ([place])   . GERD (gastroesophageal reflux disease)   . Hyperglycemia   . Hyperlipidemia   . Hypertension   . Insomnia   . Low back pain     Patient Active Problem List   Diagnosis Date Noted  . Depression, recurrent ([place]) 12/19/2018  . Peripheral edema 10/04/2016  . Parkinson disease ([place]) 09/03/2016  . Hypertension 02/04/2016  . Hyperlipidemia 02/04/2016    Past Surgical History:  Procedure Laterality Date  . ABDOMINAL HYSTERECTOMY  1991   Complete, no cancer  . CATARACT EXTRACTION Left   . SHOULDER SURGERY Right 2015    Prior to Admission medications   Medication Sig Start Date End Date Taking? Authorizing Provider  ARIPiprazole (ABILIFY) 5 MG tablet Take 5 mg by mouth daily.  03/05/21  Yes [provider]  carbidopa-levodopa (SINEMET IR) 25-100 MG tablet Take 1 tablet by mouth 3 (three) times daily. 10/07/16  Yes [provider]  cholecalciferol (VITAMIN D3) 25 MCG (1000 UNIT) tablet Take 2,000 Units by mouth daily.   Yes [provider]  Coenzyme Q10 (CO Q 10) 10 MG CAPS Take 1 capsule by mouth daily. 03/06/21  Yes [provider]  FIBER ADULT GUMMIES PO Take by mouth.   Yes [provider]  fish oil-omega-3 fatty acids 1000 MG capsule Take 1 tablet by mouth daily.   Yes [provider]  furosemide (LASIX) 20 MG tablet Take 20 mg by mouth daily. 02/23/21  Yes [provider]  lovastatin (MEVACOR) 40 MG tablet TAKE 1 TABLET BY MOUTH AT BEDTIME Patient taking differently: Take 40 mg by mouth at bedtime. 10/15/19  Yes Johnson, Megan P, DO  melatonin 5 MG TABS Take 5 mg by mouth at bedtime. 02/23/21  Yes [provider]  Multiple Vitamin (MULTIVITAMIN) tablet Take 1 tablet by mouth daily.   Yes [provider]  rivastigmine (EXELON) 9.5 mg/24hr 9.5 mg daily. 12/12/19  Yes [provider]  TURMERIC PO Take 500 mg by mouth daily at 12 noon.   Yes [provider]  vitamin B-12 (CYANOCOBALAMIN) 500 MCG tablet Take 500 mcg by mouth daily. 02/23/21  Yes [provider]  COCONUT OIL PO Take by mouth. Patient not taking: Reported on 04/14/2021    [provider]  Allergies Amlodipine and Enalapril  Family History  Problem Relation Age of Onset  . Cancer Mother   . Hypertension Father   . Heart attack Father   . Diabetes Maternal Grandmother   . Cancer Paternal Grandmother        Stomach    Social History Social History   Tobacco Use  . Smoking status: Never Smoker  . Smokeless tobacco: Never Used  Vaping Use  . Vaping Use: Never used  Substance Use Topics  . Alcohol use: Yes    Alcohol/week: 1.0 standard drink    Types: 1 Glasses of wine per week     Comment: occassionally  . Drug use: No    Review of Systems  Constitutional: No fever/chills Eyes: No visual changes. ENT: No sore throat. Cardiovascular: Denies chest pain. Respiratory: Denies shortness of breath. Gastrointestinal: Positive for abdominal pain.  No nausea, no vomiting.  No diarrhea.  No constipation. Genitourinary: Negative for dysuria. Musculoskeletal: Negative for back pain. Skin: Negative for rash. Neurological: Negative for headaches, focal weakness or numbness.  ____________________________________________   PHYSICAL EXAM:  VITAL SIGNS: ED Triage Vitals  Enc Vitals Group     BP 04/14/21 1347 119/79     Pulse Rate 04/14/21 1347 78     Resp --      Temp 04/14/21 1347 98.9 F (37.2 C)     Temp Source 04/14/21 1347 Oral     SpO2 04/14/21 1347 98 %     Weight 04/14/21 1358 139 lb 15.9 oz (63.5 kg)     Height 04/14/21 1358 5\' 5"  (1.651 m)     Head Circumference --      Peak Flow --      Pain Score 04/14/21 1401 0     Pain Loc --      Pain Edu? --      Excl. in [place]? --     Constitutional: Withdrawn and not cooperative with evaluation, oriented to person and place but not time when interacting. Eyes: Conjunctivae are normal.  Pupils equal round reactive to light bilaterally. Head: Atraumatic. Nose: No congestion/rhinnorhea. Mouth/Throat: Mucous membranes are moist. Neck: Normal ROM Cardiovascular: Normal rate, regular rhythm. Grossly normal heart sounds. Respiratory: Normal respiratory effort.  No retractions. Lungs CTAB. Gastrointestinal: Soft and diffusely tender to palpation with no rebound or guarding. No distention. Genitourinary: deferred Musculoskeletal: No lower extremity tenderness nor edema. Neurologic:  Normal speech and language. No gross focal neurologic deficits are appreciated. Skin:  Skin is warm, dry and intact. No rash noted. Psychiatric: Mood and affect are normal. Speech and behavior are  normal.  ____________________________________________   LABS (all labs ordered are listed, but only abnormal results are displayed)  Labs Reviewed  COMPREHENSIVE METABOLIC PANEL - Abnormal; Notable for the following components:      Result Value   Anion gap 4 (*)    All other components within normal limits  URINALYSIS, COMPLETE (UACMP) WITH MICROSCOPIC - Abnormal; Notable for the following components:   Color, Urine YELLOW (*)    APPearance CLEAR (*)    Leukocytes,Ua MODERATE (*)    All other components within normal limits  URINE CULTURE  SARS CORONAVIRUS 2 (TAT 6-24 HRS)  CBC WITH DIFFERENTIAL/PLATELET  LIPASE, BLOOD  TROPONIN I (HIGH SENSITIVITY)  TROPONIN I (HIGH SENSITIVITY)   ____________________________________________  EKG  ED ECG REPORT I, Blake Divine, the attending physician, personally viewed and interpreted this ECG.   Date: 04/14/2021  EKG Time: 14:03  Rate: 85  Rhythm: normal  sinus rhythm  Axis: Normal  Intervals:none  ST&T Change: None   PROCEDURES  Procedure(s) performed (including Critical Care):  Procedures   ____________________________________________   INITIAL IMPRESSION / ASSESSMENT AND PLAN / ED COURSE       70 year old female with past medical history of hypertension, hyperlipidemia, dementia, and progressive supranuclear palsy who presents to the ED due to concern for altered mental status.  Patient initially appeared nonverbal, but actively resisting opening of her eyes.  She eventually opened her eyes and began to talk following sternal rub, is oriented to person and place but not time.  She does not appear to have any focal neurologic deficits on exam but refuses to participate with full neurologic exam.  Family is coming to the ED to further assess if this is a significant fracture from her baseline, although they reports she has been more withdrawn lately.  We will check CT head and CT abdomen/pelvis given her reported abdominal  discomfort.  EKG shows no evidence of arrhythmia or ischemia, labs and UA are pending.  CT head is negative for acute process, CT abdomen/pelvis is also unremarkable.  Labs are unremarkable but UA does appear consistent with infection which could be contributing to her altered mental status.  On reassessment, patient remains somnolent but arousable.  We will give dose of IV Rocephin and case discussed with hospitalist for admission.      ____________________________________________   FINAL CLINICAL IMPRESSION(S) / ED DIAGNOSES  Final diagnoses:  Altered mental status, unspecified altered mental status type  Acute cystitis without hematuria     ED Discharge Orders    None       Note:  This document was prepared using Dragon voice recognition software and may include unintentional dictation errors.   Blake Divine, MD 04/14/21 1919

## 2021-04-14 NOTE — ED Notes (Signed)
Melissa Wise Economist request now.

## 2021-04-14 NOTE — ED Triage Notes (Signed)
Pt in via EMS from [place]; per staff at facility pt was walking around with her eyes closed and is nonverbal. Pt's eyes currently opened but had been keeping them shut initially with ER staff as well. Pt remains nonverbal. Pt currently verbal with this RN. Pt knows self only (A&Ox1; disoriented x3). Pt calm; resp reg/unlabored; skin dry.

## 2021-04-14 NOTE — ED Notes (Signed)
Melissa Wise collecting blood work now.

## 2021-04-14 NOTE — ED Notes (Signed)
Able to collect sample upon 2nd attempt with I&O cath.

## 2021-04-14 NOTE — ED Notes (Signed)
Called lab since blood work doesn't currently appear collected in chart but this RN saw NT collect it. Elmyra Ricks found blood and states will start running it now.

## 2021-04-14 NOTE — ED Notes (Signed)
Pt remains away at imaging.

## 2021-04-14 NOTE — ED Notes (Signed)
Attempted I&O cath.

## 2021-04-14 NOTE — ED Triage Notes (Signed)
Presents via EMS from [employer]  She has a hx of Dementia but has been less responsive today  Per EMS she fell asleep at lunch

## 2021-04-14 NOTE — ED Notes (Signed)
Pt's was double briefed from facility. Brief closes to skin full of old urine. Pt placed in new brief and linens changed. Purewick placed. Pt repositioned in bed. Visitor remains at bedside. Pt's left leg edematous; family at bedside states it is chronic.

## 2021-04-14 NOTE — ED Notes (Signed)
Pt reports abdominal pain and states had BM last night. Pt doesn't answer this RN if she is tender when this RN palpates abdomen. Pt does, however, grimace slightly.

## 2021-04-14 NOTE — ED Notes (Signed)
Current IV will no longer pull back enough blood for repeat trop. Will place 2nd IV.

## 2021-04-15 ENCOUNTER — Inpatient Hospital Stay: Payer: Medicare Other

## 2021-04-15 DIAGNOSIS — R4182 Altered mental status, unspecified: Secondary | ICD-10-CM

## 2021-04-15 LAB — CBC
HCT: 35.2 % — ABNORMAL LOW (ref 36.0–46.0)
Hemoglobin: 12.1 g/dL (ref 12.0–15.0)
MCH: 29.9 pg (ref 26.0–34.0)
MCHC: 34.4 g/dL (ref 30.0–36.0)
MCV: 86.9 fL (ref 80.0–100.0)
Platelets: 129 10*3/uL — ABNORMAL LOW (ref 150–400)
RBC: 4.05 MIL/uL (ref 3.87–5.11)
RDW: 12.8 % (ref 11.5–15.5)
WBC: 4.4 10*3/uL (ref 4.0–10.5)
nRBC: 0 % (ref 0.0–0.2)

## 2021-04-15 LAB — BASIC METABOLIC PANEL
Anion gap: 9 (ref 5–15)
BUN: 10 mg/dL (ref 8–23)
CO2: 26 mmol/L (ref 22–32)
Calcium: 8.8 mg/dL — ABNORMAL LOW (ref 8.9–10.3)
Chloride: 103 mmol/L (ref 98–111)
Creatinine, Ser: 0.51 mg/dL (ref 0.44–1.00)
GFR, Estimated: >60 mL/min (ref >60)
Glucose, Bld: 87 mg/dL (ref 70–99)
Potassium: 3.8 mmol/L (ref 3.5–5.1)
Sodium: 138 mmol/L (ref 135–145)

## 2021-04-15 LAB — HIV ANTIBODY (ROUTINE TESTING W REFLEX): HIV Screen 4th Generation wRfx: NONREACTIVE

## 2021-04-15 LAB — SARS CORONAVIRUS 2 (TAT 6-24 HRS): SARS Coronavirus 2: NEGATIVE

## 2021-04-15 MED ORDER — SODIUM CHLORIDE 0.9 % IV SOLN
1.0000 g | INTRAVENOUS | Status: AC
  Administered 2021-04-15 – 2021-04-16 (×2): 1 g via INTRAVENOUS
  Filled 2021-04-15: qty 10
  Filled 2021-04-15: qty 1

## 2021-04-15 NOTE — Evaluation (Signed)
Occupational Therapy Evaluation Patient Details Name: Melissa Wise MRN: [id number] DOB: 1951/10/01 Today's Date: 04/15/2021    History of Present Illness 70 y.o. 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  She was reportedly walking with her eyes closed.  She was responding to sternal rub.  She reportedly had abdominal pain without specific localization.  According to the patient's sister, the patient has been more withdrawn lately and less interactive with both family and staff.   Clinical Impression   Patient presenting with decreased I in self care, balance, functional mobility/transfers, endurance, and safety awareness.  Patient with dementia at baseline and living at [facility]. Per chart review, pt ambulates in facility without use of AD. Unsure of how much assistance pt needs for novel tasks such as self care. Pt's sister reports to staff pt initially needing some assistance and then is able to complete task on her own with increased time. This session pt is very lethargic with tangential speech and needing max cuing to initiate tasks. Pt needing hand over hand assistance to wash face and did not want therapist to assist with oral care often biting down on suction toothbrush. Pt resistive to movement and needing total A to EOB. Once EOB, pt demonstrated static sitting with supervision - min guard for balance. Pt returning to bed with max A and OT set up tray for pt. She needed hand over had assistance for several bites to increase oral intake. OT notified RN. RN later reports that pt was able to feed herself the rest of her tray with increased time. Pt's sister reports this as baseline for self feeding. Patient will benefit from acute OT  to increase overall independence in the areas of ADLs, functional mobility, and safety awareness in order to safely discharge to next venue of care.    Follow Up Recommendations  Supervision/Assistance - 24 hour;SNF    Equipment Recommendations  Other (comment) (defer to next venue of care)       Precautions / Restrictions Precautions Precautions: Fall      Mobility Bed Mobility Overal bed mobility: Needs Assistance Bed Mobility: Supine to Sit;Sit to Supine     Supine to sit: Total assist Sit to supine: Max assist   General bed mobility comments: max multimodal cuing    Transfers                 General transfer comment: pt refusal and resistive once EOB    Balance Overall balance assessment: Needs assistance Sitting-balance support: Feet unsupported Sitting balance-Leahy Scale: Fair Sitting balance - Comments: supervision - min guard with static sitting                                   ADL either performed or assessed with clinical judgement   ADL Overall ADL's : Needs assistance/impaired                                       General ADL Comments: Pt needing max - total A secondary to pt unable to initiate tasks without hand over hand assistance. Pt often resistive towards movement as well.  Vision Patient Visual Report: No change from baseline              Pertinent Vitals/Pain Pain Assessment: Faces Faces Pain Scale: No hurt     Hand Dominance Right   Extremity/Trunk Assessment Upper Extremity Assessment Upper Extremity Assessment: Generalized weakness   Lower Extremity Assessment Lower Extremity Assessment: Generalized weakness       Communication Communication Communication: No difficulties   Cognition Arousal/Alertness: Lethargic Behavior During Therapy: Flat affect Overall Cognitive Status: History of cognitive impairments - at baseline                                 General  Comments: Pt oriented to self only. Tangential speech throughout with max multimodal cuing and pt unable to initiate tasks without hand over hand assistance.              Home Living Family/patient expects to be discharged to:: Assisted living                                        Prior Functioning/Environment          Comments: per chart review, pt lives at [facility] (likely memory care unit) pt ambulating hallway without use of AD. Unable to confirm if pt needed self care assistance or no. No family present during evaluation.        OT Problem List: Decreased strength;Impaired balance (sitting and/or standing);Decreased cognition;Decreased safety awareness;Cardiopulmonary status limiting activity;Decreased activity tolerance;Decreased coordination;Decreased knowledge of use of DME or AE      OT Treatment/Interventions: Self-care/ADL training;Manual therapy;Therapeutic exercise;Patient/family education;Neuromuscular education;Balance training;Energy conservation;Therapeutic activities;Cognitive remediation/compensation;DME and/or AE instruction    OT Goals(Current goals can be found in the care plan section) Acute Rehab OT Goals Patient Stated Goal: none stated OT Goal Formulation: Patient unable to participate in goal setting Time For Goal Achievement: 04/29/21 Potential to Achieve Goals: Fair  OT Frequency: Min 2X/week   Barriers to D/C:    none known at this time          AM-PAC OT "6 Clicks" Daily Activity     Outcome Measure Help from another person eating meals?: A Lot Help from another person taking care of personal grooming?: A Lot Help from another person toileting, which includes using toliet, bedpan, or urinal?: Total Help from another person bathing (including washing, rinsing, drying)?: A Lot Help from another person to put on and taking off regular upper body clothing?: A Lot Help from another person to put on and taking off regular  lower body clothing?: Total 6 Click Score: 10   End of Session Nurse Communication: Mobility status;Precautions;Other (comment) (Pt is a feeder)  Activity Tolerance: Patient tolerated treatment well Patient left: in bed;with call bell/phone within reach;with bed alarm set  OT Visit Diagnosis: Unsteadiness on feet (R26.81);Muscle weakness (generalized) (M62.81)                Time: 4315-4008 OT Time Calculation (min): 34 min Charges:  OT General Charges $OT Visit: 1 Visit OT Evaluation $OT Eval Moderate Complexity: 1 Mod OT Treatments $Self Care/Home Management : 23-37 mins  Darleen Crocker, MS, OTR/L , CBIS ascom [phone]  04/15/21, 10:56 AM

## 2021-04-15 NOTE — Evaluation (Signed)
Physical Therapy Evaluation Patient Details Name: Melissa Wise MRN: [id number] DOB: August 29, 1951 Today's Date: 04/15/2021   History of Present Illness  70 y.o. 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  She was reportedly walking with her eyes closed.  She was responding to sternal rub.  She reportedly had abdominal pain without specific localization.  According to the patient's sister, the patient has been more withdrawn lately and less interactive with both family and staff.  Clinical Impression  Pt able to, barely, state name and part of her birthday on arrival, most information gathered via sister.  Pt has been having recent limitations with decreased interaction/awareness.  She could not initiate any activity with just verbal cuing, but with relatively constant tactile/direct cuing she did do mobility and ambulate ~90 ft.  Pt showed poor awareness and veered with walker anytime PT did not directly guide it.  Pt's mentation is a big limiter, but never-the-less she is not at her baseline and will benefit from HHPT to try and maintain her activity level and safety awareness moving forward.  Pt clearly needs 24/7 assist.    Follow Up Recommendations Home health PT;Supervision/Assistance - 24 hour    Equipment Recommendations  None recommended by PT    Recommendations for Other Services       Precautions / Restrictions Precautions Precautions: Fall Restrictions Weight Bearing Restrictions: No      Mobility  Bed Mobility Overal bed mobility: Needs Assistance Bed Mobility: Supine to Sit;Sit to Supine     Supine to sit: Max assist Sit to supine: Max assist   General bed mobility comments: pt unable to initiate even minimal movement to/from  supine despite heavy cuing.  max assist to transition    Transfers Overall transfer level: Needs assistance Equipment used: Rolling walker (2 wheeled) Transfers: Sit to/from Stand Sit to Stand: Min assist         General transfer comment: Pt unable to rise to standing with just verbal cuing, once PT gave some assist to initiate rise to standing she did show some effort and got to standing w/o heavy assist. Similar situation both times  Ambulation/Gait Ambulation/Gait assistance: Min guard Gait Distance (Feet): 90 Feet Assistive device: Rolling walker (2 wheeled)       General Gait Details: Pt with expected shuffling gait.  She needed guidance to initiate walker movement and needed consistent hands-on assist to maintain walker momentum and/or avoid veering/running into objects.  Pt's vitals remain stable during ambulation.  Physically she did relatively well, but needed constant assistance.  Stairs            Wheelchair Mobility    Modified Rankin (Stroke Patients Only)       Balance Overall balance assessment: Needs assistance Sitting-balance support: Feet supported Sitting balance-Leahy Scale: Fair Sitting balance - Comments: once assisted to EOB she was able to maintain balance w/o hands on assist   Standing balance support: Bilateral upper extremity supported Standing balance-Leahy Scale: Fair Standing balance comment: No stagger stepping or LOBs, but generally speaking needing constant guaidance/direct assist with walker manipulation to avoid obstacles and remain on task.  Poor awareness w/o overt balance issues.  Pertinent Vitals/Pain Pain Assessment: Faces Faces Pain Scale: No hurt    Home Living Family/patient expects to be discharged to:: Assisted living               Home Equipment: Walker - 4 wheels      Prior Function Level of Independence: Needs assistance   Gait / Transfers Assistance Needed:  Apparently pt will typically need some cuing to initiate much activity but can walk down to dining area, sister stops in every 3ish days and tries to take her for more prolonged walks.  Pt has had 3 falls in the last 6 months.  ADL's / Homemaking Assistance Needed: Pt needs help with eating, self care, etc 2/2 dementia        Hand Dominance        Extremity/Trunk Assessment   Upper Extremity Assessment Upper Extremity Assessment: Difficult to assess due to impaired cognition    Lower Extremity Assessment Lower Extremity Assessment: Difficult to assess due to impaired cognition       Communication   Communication: Expressive difficulties  Cognition Arousal/Alertness: Lethargic Behavior During Therapy: Flat affect Overall Cognitive Status: History of cognitive impairments - at baseline                                 General Comments: baseline dementia, according to sister more limited/less interactive recently      General Comments General comments (skin integrity, edema, etc.): Pt with significant baseline dementia with more acute changes/limitations.  Pt's eye quick to close and did not open with loud cuing/sternal rubs/etc but she did engage with passive mobility/transition to sitting    Exercises     Assessment/Plan    PT Assessment Patient needs continued PT services  PT Problem List Decreased strength;Decreased range of motion;Decreased activity tolerance;Decreased balance;Decreased mobility;Decreased cognition;Decreased knowledge of use of DME;Decreased safety awareness       PT Treatment Interventions DME instruction;Gait training;Stair training;Functional mobility training;Therapeutic activities;Therapeutic exercise;Balance training;Neuromuscular re-education;Cognitive remediation;Patient/family education    PT Goals (Current goals can be found in the Care Plan section)  Acute Rehab PT Goals Patient Stated Goal: sister hoping she can get back to  some regular activity at her ALF PT Goal Formulation: With patient Time For Goal Achievement: 04/29/21 Potential to Achieve Goals: Fair    Frequency Min 2X/week   Barriers to discharge        Co-evaluation               AM-PAC PT "6 Clicks" Mobility  Outcome Measure Help needed turning from your back to your side while in a flat bed without using bedrails?: Total Help needed moving from lying on your back to sitting on the side of a flat bed without using bedrails?: Total Help needed moving to and from a bed to a chair (including a wheelchair)?: A Lot Help needed standing up from a chair using your arms (e.g., wheelchair or bedside chair)?: A Lot Help needed to walk in hospital room?: A Little Help needed climbing 3-5 steps with a railing? : Total 6 Click Score: 10    End of Session Equipment Utilized During Treatment: Gait belt Activity Tolerance: Patient tolerated treatment well;Other (comment) (dementia is a very significant functional limiter)     PT Visit Diagnosis: Difficulty in walking, not elsewhere classified (R26.2);Unsteadiness on feet (R26.81);History of falling (Z91.81)    Time: 1438-1510 PT Time Calculation (min) (ACUTE ONLY): 32 min  Charges:   PT Evaluation $PT Eval Low Complexity: 1 Low PT Treatments $Gait Training: 8-22 mins        Kreg Shropshire, DPT 04/15/2021, 4:23 PM

## 2021-04-15 NOTE — Evaluation (Signed)
Clinical/Bedside Swallow Evaluation Patient Details  Name: Melissa Wise MRN: [id number] Date of Birth: 13-May-1951  Today's Date: 04/15/2021 Time: SLP Start Time (ACUTE ONLY): 1115 SLP Stop Time (ACUTE ONLY): 1215 SLP Time Calculation (min) (ACUTE ONLY): 60 min  Past Medical History:  Past Medical History:  Diagnosis Date  . Allergy   . Ataxia   . Dementia ([place])   . GERD (gastroesophageal reflux disease)   . Hyperglycemia   . Hyperlipidemia   . Hypertension   . Insomnia   . Low back pain    Past Surgical History:  Past Surgical History:  Procedure Laterality Date  . ABDOMINAL HYSTERECTOMY  1991   Complete, no cancer  . CATARACT EXTRACTION Left   . SHOULDER SURGERY Right 2015   HPI:  Pt is a 70 y.o. 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  She was reportedly walking with her eyes closed.  She reportedly had abdominal pain without specific localization.  According to the patient's sister, the patient has been more withdrawn lately and less interactive with both family and staff.   CXR: No acute cardiopulmonary abnormalities.  Head CT: Generalized cerebral atrophy. 2. No acute intracranial abnormality.  Pt admitted w/ dx of Acute metabolic encephalopathy likely secondary to urinary tract infection; baseline Parkinson's Dis. and Dementia.   Assessment / Plan / Recommendation Clinical Impression  Pt appears to present w/ adequate pharyngeal phase swallow w/ No pharyngeal phase dysphagia noted, No neuromuscular deficits noted. However, pt exhibits oral phase dysphagia c/b oral prep phase deficits and decreased awareness in initiation of self-feeding of po's d/t Baseline Dementia and Cognitive decline. Once given Full tray setup and  Feeding of initial po trials, pt was able to engage in self-feeding and adequate oral intake of po's. Monitoring needed for safe intake and lessen any impulsive feeding that could increase risk for aspiration/choking.  Pt consumed po trials w/ No overt, clinical s/s of aspiration during po trials. Pt appears at reduced risk for aspiration following general aspiration precautions and given assistance during meals; monitoring for safe oral intake.  During po trials, pt consumed all consistencies w/ no overt coughing, or change in respiratory presentation during/post trials. Pt was nonverbal so unable to assess vocal quality. Oral phase appeared grossly [facility] w/ timely bolus management, mastication, and control of bolus propulsion for A-P transfer for swallowing. Oral clearing achieved w/ all trial consistencies. Oral prep stage was inhibited initially but improved w/ cues and trials. OM Exam appeared [facility] during movements w/ bolus management w/ no unilateral weakness noted. Pt fed self w/ setup and support.     Recommend a more Mech Soft consistency diet w/ well-Cut meats, moistened foods; Thin liquids. Recommend general aspiration precautions, Support at meals to setup trays and help pt engage/initiate self-feeding. Supervision at meals. Pills CRUSHED in Puree for safer, easier swallowing d/t Cognitive decline. Education given on Pills in Puree; food consistencies and easy to eat options; general aspiration precautions to NSG/pt. NSG agreed. Recommend Dietician f/u for support. SLP Visit Diagnosis: Dysphagia, oral phase (R13.11) (baseline Dementia, dec'd awarenes)    Aspiration Risk  Mild aspiration risk;Risk for inadequate nutrition/hydration (reduced following precautions)    Diet Recommendation   Mech Soft consistency diet w/ well-Cut meats, moistened foods; Thin liquids. Recommend general aspiration precautions. Support at meals to setup  trays and help pt engage/initiate self-feeding. Supervision at meals.  Monitor for any impulsive eating behaviors.  Medication Administration: Crushed with puree (for safer swallowing d/t decreased awareness)    Other  Recommendations Recommended Consults:  (Dietician support w/ drink supplement, snacks) Oral Care Recommendations: Oral care BID;Oral care before and after PO;Staff/trained caregiver to provide oral care Other Recommendations:  (n/a)   Follow up Recommendations None      Frequency and Duration min 1 x/week  1 week       Prognosis Prognosis for Safe Diet Advancement: Fair (-Good) Barriers to Reach Goals: Cognitive deficits;Language deficits;Severity of deficits;Time post onset;Behavior      Swallow Study   General Date of Onset: 04/14/21 HPI: Pt is a 70 y.o. 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  She was reportedly walking with her eyes closed.  She reportedly had abdominal pain without specific localization.  According to the patient's sister, the patient has been more withdrawn lately and less interactive with both family and staff.   CXR: No acute cardiopulmonary abnormalities.  Head CT: Generalized cerebral atrophy. 2. No acute intracranial abnormality.  Pt admitted w/ dx of Acute metabolic encephalopathy likely secondary to urinary tract infection; baseline Parkinson's Dis. and Dementia. Type of Study: Bedside Swallow Evaluation Previous Swallow Assessment: none Diet Prior to this Study: Regular;Thin liquids Temperature Spikes Noted: No (wbc 4.4) Respiratory Status: Room air History of Recent Intubation: No Behavior/Cognition: Cooperative;Pleasant mood;Confused;Doesn't follow directions;Requires cueing (nonverbal) Oral Cavity Assessment: Dry Oral Care Completed by SLP: Yes Oral  Cavity - Dentition: Adequate natural dentition Vision:  (n/a) Self-Feeding Abilities: Total assist Patient Positioning: Upright in bed (needed positioning - head support) Baseline Vocal Quality:  (nonverbal) Volitional Cough: Cognitively unable to elicit Volitional Swallow: Unable to elicit    Oral/Motor/Sensory Function Overall Oral Motor/Sensory Function: Within functional limits (appeared w/ bolus management, movements)   Ice Chips Ice chips: Within functional limits Presentation: Spoon (2 trials)   Thin Liquid Thin Liquid: Within functional limits Presentation: Straw (fed; 10+ trials) Other Comments: pinched straw delivery to initiate drinking tasks/trials d/t confusion    Nectar Thick Nectar Thick Liquid: Not tested   Honey Thick Honey Thick Liquid: Not tested   Puree Puree: Within functional limits Presentation: Spoon (fed; 8+ trials) Other Comments: assisted to initiate po task d/t confusion   Solid     Solid: Impaired (min during oral prep) Presentation: Spoon (fed; 6 trials) Oral Phase Impairments: Poor awareness of bolus (acceptance during oral prep) Pharyngeal Phase Impairments:  (none)       Orinda Kenner, MS, [employer] Speech Language Pathologist Rehab Services [phone] [facility] 04/15/2021,1:01 PM

## 2021-04-15 NOTE — Progress Notes (Signed)
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  She was reportedly walking with her eyes closed and occasionally minimally responsive.  According to the patient's sister she has been more withdrawn lately and less interactive with family and staff.  No reported symptoms however she is a poor historian due to her dementia  Subjective / 24h Interval events: Keeps her eyes closed during interview, mumbling unintelligibly  Assessment & Plan: Principal Problem Acute metabolic encephalopathy, possibly due to UTI-underlying dementia makes it hard for her to express her symptoms -Has been placed on ceftriaxone, continue while monitoring cultures -We will reach out to family to see if they can visit as they will be able to better appreciate her baseline mental status but not at baseline given less interaction with me and continue to keep her eyes closed  Active Problems Parkinson's disease, underlying dementia-continue Sinemet, Exelon patch, Abilify  Hyperlipidemia-continue statin  Hypertension-at home she is on Lasix  Scheduled Meds: . ARIPiprazole  5 mg Oral QHS  . carbidopa-levodopa  1 tablet Oral TID  . cholecalciferol  2,000 Units Oral Daily  . enoxaparin (LOVENOX) injection  40 mg Subcutaneous Q24H  . melatonin  5 mg Oral QHS  . multivitamin with minerals  1 tablet Oral Daily  . omega-3 acid ethyl esters  1 g Oral Daily  . pravastatin  40 mg Oral q1800  . rivastigmine  9.5 mg Transdermal Daily  . vitamin B-12  500 mcg Oral Daily   Continuous Infusions: . sodium chloride 100 mL/hr at 04/15/21 0547   PRN Meds:.acetaminophen **OR** acetaminophen, magnesium hydroxide, ondansetron **OR** ondansetron (ZOFRAN) IV,  traZODone  Diet Orders (From admission, onward)    Start     Ordered   04/15/21 1144  DIET DYS 3 Room service appropriate? No; Fluid consistency: Thin  Diet effective now       Comments: Extra Gravy on chopped/cut meats; cut spaghetti. Gravy on potatoes too. Please cut broccoli. Un-sweet Tea, water on trays.  Question Answer Comment  Room service appropriate? No   Fluid consistency: Thin      04/15/21 1143          DVT prophylaxis: enoxaparin (LOVENOX) injection 40 mg Start: 04/14/21 2200     Code Status: Full Code  Family Communication: no family at bedside   Status is: Inpatient  Remains inpatient appropriate because:Inpatient level of care appropriate due to severity of illness   Dispo: The patient is from: Home              Anticipated d/c is to: Home              Patient currently is not medically stable to d/c.   Difficult to place patient No   Level of care: Med-Surg  Consultants:  None   Procedures:  None   Microbiology  None   Antimicrobials: None     Objective: Vitals:   04/14/21 1930 04/14/21 2101 04/15/21 0342 04/15/21 1040  BP: 129/79 137/75 (!) 147/83 102/66  Pulse:  73 68 84  Resp:  16 16   Temp:  98.3 F (36.8 C) 97.7 F (36.5 C) 98.8 F (37.1 C)  TempSrc:  Oral Oral Oral  SpO2:  100% 94% 99%  Weight:      Height:  Intake/Output Summary (Last 24 hours) at 04/15/2021 1250 Last data filed at 04/15/2021 0700 Gross per 24 hour  Intake 1006.28 ml  Output --  Net 1006.28 ml   Filed Weights   04/14/21 1358 04/14/21 1402  Weight: 63.5 kg 59 kg    Examination:  Constitutional: NAD Eyes: no scleral icterus ENMT: Mucous membranes are moist.  Neck: normal, supple Respiratory: clear to auscultation bilaterally, no wheezing, no crackles. Normal respiratory effort. No accessory muscle use.  Cardiovascular: Regular rate and rhythm, no murmurs / rubs / gallops. No LE edema. Good peripheral pulses Abdomen: non distended, no  tenderness. Bowel sounds positive.  Musculoskeletal: no clubbing / cyanosis.  Skin: no rashes Neurologic: Does not follow commands consistently, no apparent focal deficits  Data Reviewed: I have independently reviewed following labs and imaging studies   CBC: Recent Labs  Lab 04/14/21 1425 04/15/21 0640  WBC 6.7 4.4  NEUTROABS 4.5  --   HGB 12.7 12.1  HCT 37.1 35.2*  MCV 86.7 86.9  PLT 172 631*   Basic Metabolic Panel: Recent Labs  Lab 04/14/21 1425 04/15/21 0554  NA 137 138  K 3.9 3.8  CL 103 103  CO2 30 26  GLUCOSE 99 87  BUN 17 10  CREATININE 0.60 0.51  CALCIUM 8.9 8.8*   Liver Function Tests: Recent Labs  Lab 04/14/21 1425  AST 20  ALT 9  ALKPHOS 63  BILITOT 0.9  PROT 6.7  ALBUMIN 4.0   Coagulation Profile: No results for input(s): INR, PROTIME in the last 168 hours. HbA1C: No results for input(s): HGBA1C in the last 72 hours. CBG: No results for input(s): GLUCAP in the last 168 hours.  Recent Results (from the past 240 hour(s))  SARS CORONAVIRUS 2 (TAT 6-24 HRS) Nasopharyngeal Nasopharyngeal Swab     Status: None   Collection Time: 04/14/21  7:10 PM   Specimen: Nasopharyngeal Swab  Result Value Ref Range Status   SARS Coronavirus 2 NEGATIVE NEGATIVE Final    Comment: (NOTE) SARS-CoV-2 target nucleic acids are NOT DETECTED.  The SARS-CoV-2 RNA is generally detectable in upper and lower respiratory specimens during the acute phase of infection. Negative results do not preclude SARS-CoV-2 infection, do not rule out co-infections with other pathogens, and should not be used as the sole basis for treatment or other patient management decisions. Negative results must be combined with clinical observations, patient history, and epidemiological information. The expected result is Negative.  Fact Sheet for Patients: SugarRoll.be  Fact Sheet for Healthcare Providers: https://www.woods-mathews.com/  This test  is not yet approved or cleared by the [place] FDA and  has been authorized for detection and/or diagnosis of SARS-CoV-2 by FDA under an Emergency Use Authorization (EUA). This EUA will remain  in effect (meaning this test can be used) for the duration of the COVID-19 declaration under Se ction 564(b)(1) of the Act, 21 U.S.C. section 360bbb-3(b)(1), unless the authorization is terminated or revoked sooner.  Performed at [facility], [place] [address], [place], [place] [address]   MRSA PCR Screening     Status: None   Collection Time: 04/14/21  9:11 PM   Specimen: Nasal Mucosa; Nasopharyngeal  Result Value Ref Range Status   MRSA by PCR NEGATIVE NEGATIVE Final    Comment:        The GeneXpert MRSA Assay (FDA approved for NASAL specimens only), is one component of a comprehensive MRSA colonization surveillance program. It is not intended to diagnose MRSA infection nor to guide  or monitor treatment for MRSA infections. Performed at [facility], [address], [place], [place] [address]      Radiology Studies: CT Abdomen Pelvis Wo Contrast  Result Date: 04/14/2021 CLINICAL DATA:  Abdominal pain with palpation. EXAM: CT ABDOMEN AND PELVIS WITHOUT CONTRAST TECHNIQUE: Multidetector CT imaging of the abdomen and pelvis was performed following the standard protocol without IV contrast. COMPARISON:  None. FINDINGS: Lower chest: No acute abnormality. Hepatobiliary: No focal liver abnormality is seen. Heterogeneous sludge is suspected within the gallbladder without evidence of gallstones, gallbladder wall thickening, or biliary dilatation. Pancreas: Unremarkable. No pancreatic ductal dilatation or surrounding inflammatory changes. Spleen: Normal in size without focal abnormality. Adrenals/Urinary Tract: Adrenal glands are unremarkable. Kidneys are normal in size, without renal calculi or hydronephrosis. A 2.1 cm x 1.6 cm cystic appearing area is seen within the anterolateral  aspect of the upper pole of the right kidney bladder is unremarkable. Stomach/Bowel: Stomach is within normal limits. Appendix appears normal. Large amount of stool is seen throughout the colon. No evidence of bowel wall thickening, distention, or inflammatory changes. Vascular/Lymphatic: Aortic atherosclerosis. No enlarged abdominal or pelvic lymph nodes. Reproductive: Status post hysterectomy. No adnexal masses. Other: No abdominal wall hernia or abnormality. No abdominopelvic ascites. Musculoskeletal: Asymmetric enlargement of the right psoas muscle is seen (axial CT images 28 through 46, CT series 3). This is of indeterminate age. No associated inflammatory fat stranding is identified. Multilevel degenerative changes are seen throughout the lumbar spine. IMPRESSION: 1. Gallbladder sludge without cholelithiasis or acute cholecystitis. 2. Suspected simple left renal cysts. 3. Asymmetric right psoas muscle enlargement of indeterminate age. This is a nonspecific finding may be chronic in nature. Electronically Signed   By: Virgina Norfolk M.D.   On: 04/14/2021 15:17   DG Chest 2 View  Result Date: 04/14/2021 CLINICAL DATA:  Weakness. EXAM: CHEST - 2 VIEW COMPARISON:  07/16/2016 FINDINGS: Heart size is normal. No pleural effusion or edema. No airspace opacities identified. Remote deformities involving the left scapula and distal left clavicle. There is also a remote deformity involving the proximal aspect of the right humerus. IMPRESSION: No acute cardiopulmonary abnormalities. Electronically Signed   By: Kerby Moors M.D.   On: 04/14/2021 15:20   CT Head Wo Contrast  Result Date: 04/14/2021 CLINICAL DATA:  Altered mental status. EXAM: CT HEAD WITHOUT CONTRAST TECHNIQUE: Contiguous axial images were obtained from the base of the skull through the vertex without intravenous contrast. COMPARISON:  Mar 18, 2021 FINDINGS: Brain: There is mild cerebral atrophy with widening of the extra-axial spaces and  ventricular dilatation. There are areas of decreased attenuation within the white matter tracts of the supratentorial brain, consistent with microvascular disease changes. Vascular: No hyperdense vessel or unexpected calcification. Skull: Normal. Negative for fracture or focal lesion. Sinuses/Orbits: No acute finding. Other: None. IMPRESSION: 1. Generalized cerebral atrophy. 2. No acute intracranial abnormality. Electronically Signed   By: Virgina Norfolk M.D.   On: 04/14/2021 15:07      Marzetta Board, MD, PhD [facility]  Between 7 am - 7 pm I am available, please contact me via Amion (for emergencies) or Securechat (non urgent messages)  Between 7 pm - 7 am I am not available, please contact night coverage MD/APP via Amion

## 2021-04-16 LAB — URINE CULTURE

## 2021-04-16 NOTE — Progress Notes (Signed)
Physical Therapy Treatment Patient Details Name: Melissa Wise MRN: [id number] DOB: [birth year] Today's Date: 04/16/2021    History of Present Illness 70 y.o. 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  She was reportedly walking with her eyes closed.  She was responding to sternal rub.  She reportedly had abdominal pain without specific localization.  According to the patient's sister, the patient has been more withdrawn lately and less interactive with both family and staff.    PT Comments    Patient sleeping soundly upon arrival to room; requires max cuing for alertness and participation with session.  Generally rigid with very limited spontaneous initiation of movement; requiring hand-over-hand for all movement facilitated by therapist. Does require physical assist for all functional activities-max assist for bed mobility; min/mod for sit/stand, basic transfers and gait (200') with RW.  Gait pattern significant for very narrowed BOS, excessive R foot supination throughout gait cycle; limited balance reactions, all planes, but gait pattern, balance improve throughout gait distance (as gait becomes more spontaneous/automatic).  Does require dep assist (hand-over-hand) for advancement of RW at all times  At end of session, patient sitting in recliner, needs in reach; consuming milkshake fixed by therapist.  When patient upright, engaged and spontaneously initiating own activities, much more mobile throughout extremities.  Rigidity/resistance to movement diminishes.  Able to feed self without difficulty.  Anticipate similar response with mobility in self-initiated, spontaneous mobility tasks.  Family/care team concerned with level of assist required for  functional tasks.  Verbally reviewed course of dementia, impact on functional performance and value of normal routine, familiar environment.  Do feel patient would benefit optimally from return to ALF with [facility] services in her normal, familiar environment (to optimize carry-over and overall participation); however, this would require physical assist for all mobility tasks from staff at facility.  If facility unable to provide this level of care, may benefit from transition to STR prior to return to ALF.  Discharge recommendations updated to reflect.     Follow Up Recommendations  SNF     Equipment Recommendations       Recommendations for Other Services       Precautions / Restrictions Precautions Precautions: Fall Restrictions Weight Bearing Restrictions: No    Mobility  Bed Mobility Overal bed mobility: Needs Assistance Bed Mobility: Supine to Sit     Supine to sit: Max assist     General bed mobility comments: hand-over-hand assist to initiate all movement when cued/requested by therapist; generally rigid throughout trunk/extremities, limited ability to dissociate trunk/extremities    Transfers Overall transfer level: Needs assistance Equipment used: Rolling walker (2 wheeled) Transfers: Sit to/from Stand Sit to Stand: Mod assist         General transfer comment: manual facilitation to initiate lift off and anterior weight translation; once upright, good WBing through bilat LEs; limited balance reactions, requires hands-on assist at all times  Ambulation/Gait Ambulation/Gait assistance: Min assist;Mod assist Gait Distance (Feet): 200 Feet Assistive device: Rolling walker (2 wheeled)       General Gait Details: very narrowed BOS, excessive R foot supination throughout gait cycle; limited balance reactions, all planes, but gait pattern, balance improve throughout gait distance.  Does require dep assist (hand-over-hand) for advancement of RW at all times   Stairs  Wheelchair Mobility    Modified Rankin (Stroke Patients Only)       Balance Overall balance assessment: Needs assistance Sitting-balance support: No upper extremity supported;Feet supported Sitting balance-Leahy Scale: Fair Sitting balance - Comments: close sup for unsupported sitting once accommodated to position   Standing balance support: Bilateral upper extremity supported Standing balance-Leahy Scale: Fair                              Cognition Arousal/Alertness: Lethargic Behavior During Therapy: Flat affect Overall Cognitive Status: History of cognitive impairments - at baseline                                 General Comments: max cuing (cold washcloth to face) to awaken for participation with session      Exercises      General Comments        Pertinent Vitals/Pain Pain Assessment: Faces Faces Pain Scale: No hurt    Home Living                      Prior Function            PT Goals (current goals can now be found in the care plan section) Acute Rehab PT Goals Patient Stated Goal: sister hoping she can get back to some regular activity at her ALF PT Goal Formulation: With patient Time For Goal Achievement: 04/29/21 Potential to Achieve Goals: Fair Progress towards PT goals: Progressing toward goals    Frequency    Min 2X/week      PT Plan Current plan remains appropriate    Co-evaluation              AM-PAC PT "6 Clicks" Mobility   Outcome Measure  Help needed turning from your back to your side while in a flat bed without using bedrails?: Total Help needed moving from lying on your back to sitting on the side of a flat bed without using bedrails?: Total Help needed moving to and from a bed to a chair (including a wheelchair)?: A Lot Help needed standing up from a chair using your arms (e.g., wheelchair or bedside chair)?: A Lot Help needed to walk in hospital room?: A  Little Help needed climbing 3-5 steps with a railing? : Total 6 Click Score: 10    End of Session Equipment Utilized During Treatment: Gait belt Activity Tolerance: Patient tolerated treatment well Patient left: in chair;with call bell/phone within reach;with chair alarm set;with family/visitor present Nurse Communication: Mobility status PT Visit Diagnosis: Difficulty in walking, not elsewhere classified (R26.2);Unsteadiness on feet (R26.81);History of falling (Z91.81)     Time: 5681-2751 PT Time Calculation (min) (ACUTE ONLY): 40 min  Charges:  $Gait Training: 8-22 mins $Therapeutic Activity: 23-37 mins                     Jancy Sprankle H. Owens Shark, PT, DPT, NCS 04/16/21, 4:51 PM [phone]

## 2021-04-16 NOTE — Care Management Important Message (Signed)
Important Message  Patient Details  Name: Melissa Wise MRN: [id number] Date of Birth: [birth year]   Medicare Important Message Given:  N/A - LOS <3 / Initial given by admissions  Initial Medicare IM reviewed with Rita Ohara, sister, by Thornton Dales, Patient Access Associate on 04/15/2021 at 8:44am.     Dannette Barbara 04/16/2021, 3:13 PM

## 2021-04-16 NOTE — Progress Notes (Signed)
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  She was reportedly walking with her eyes closed and occasionally minimally responsive.  According to the patient's sister she has been more withdrawn lately and less interactive with family and staff.  No reported symptoms however she is a poor historian due to her dementia  Subjective / 24h Interval events: Alert, no complaints   Assessment & Plan: Principal Problem Acute metabolic encephalopathy, possibly due to UTI-underlying dementia makes it hard for her to express her symptoms -Has been placed on ceftriaxone, today is the last and 3rd dose -per sister she has been weaker and weaker recently. -facility cannot take her back currently since she is unable to stand and pivot independently, PT to re-evaluate suspect she will need SNF level of care -her episodes of being sleepy and poorly responsive are likely multifactorial, combination of her progressive dementia/Parkinson's disease along with possible medication effect.  I have discontinued her Abilify as well as Exelon,  Active Problems Parkinson's disease, underlying dementia-continue Sinemet,    Hyperlipidemia-continue statin   Hypertension-at home she is on Lasix  Scheduled Meds:  carbidopa-levodopa  1 tablet Oral TID   cholecalciferol  2,000 Units Oral Daily   enoxaparin (LOVENOX) injection  40 mg Subcutaneous Q24H   melatonin  5 mg Oral QHS   multivitamin with minerals  1 tablet Oral Daily   omega-3 acid ethyl esters  1 g Oral Daily   pravastatin  40 mg Oral q1800   vitamin B-12  500 mcg Oral Daily   Continuous Infusions:  cefTRIAXone (ROCEPHIN)  IV Stopped (04/15/21 1947)   PRN Meds:.acetaminophen **OR**  acetaminophen, magnesium hydroxide, ondansetron **OR** ondansetron (ZOFRAN) IV, traZODone  Diet Orders (From admission, onward)     Start     Ordered   04/15/21 1144  DIET DYS 3 Room service appropriate? No; Fluid consistency: Thin  Diet effective now       Comments: Extra Gravy on chopped/cut meats; cut spaghetti. Gravy on potatoes too. Please cut broccoli. Un-sweet Tea, water on trays.  Question Answer Comment  Room service appropriate? No   Fluid consistency: Thin      04/15/21 1143            DVT prophylaxis: enoxaparin (LOVENOX) injection 40 mg Start: 04/14/21 2200     Code Status: Full Code  Family Communication: sister at bedside   Status is: Inpatient  Remains inpatient appropriate because:Inpatient level of care appropriate due to severity of illness   Dispo: The patient is from: Home              Anticipated d/c is to: Home              Patient currently is not medically stable to d/c.   Difficult to place patient No   Level of care: Med-Surg  Consultants:  None   Procedures:  None   Microbiology  None   Antimicrobials: None     Objective: Vitals:   04/16/21 0611 04/16/21 0739 04/16/21 1124 04/16/21 1535  BP: 138/85 128/87 119/69 117/74  Pulse: 78 79 80 65  Resp: 20 20 18 16   Temp: 97.9 F (36.6 C) 97.8 F (36.6 C) 98.2 F (36.8 C) 98.3 F (36.8 C)  TempSrc: Oral Oral Oral Axillary  SpO2: 98%  99% 97% 99%  Weight:      Height:        Intake/Output Summary (Last 24 hours) at 04/16/2021 1551 Last data filed at 04/16/2021 1406 Gross per 24 hour  Intake 1723.09 ml  Output 300 ml  Net 1423.09 ml    Filed Weights   04/14/21 1358 04/14/21 1402  Weight: 63.5 kg 59 kg    Examination:  Constitutional: nad Eyes: no icterus  ENMT: mmm Neck: normal, supple Respiratory: cta biL, no wheezing, no crackles Cardiovascular: rrr, no mrg, no edema  Abdomen: soft, nt, nd, bs+ Musculoskeletal: no clubbing / cyanosis.  Skin: no  rashes Neurologic: non focal. Slow to follow commands  Data Reviewed: I have independently reviewed following labs and imaging studies   CBC: Recent Labs  Lab 04/14/21 1425 04/15/21 0640  WBC 6.7 4.4  NEUTROABS 4.5  --   HGB 12.7 12.1  HCT 37.1 35.2*  MCV 86.7 86.9  PLT 172 129*    Basic Metabolic Panel: Recent Labs  Lab 04/14/21 1425 04/15/21 0554  NA 137 138  K 3.9 3.8  CL 103 103  CO2 30 26  GLUCOSE 99 87  BUN 17 10  CREATININE 0.60 0.51  CALCIUM 8.9 8.8*    Liver Function Tests: Recent Labs  Lab 04/14/21 1425  AST 20  ALT 9  ALKPHOS 63  BILITOT 0.9  PROT 6.7  ALBUMIN 4.0    Coagulation Profile: No results for input(s): INR, PROTIME in the last 168 hours. HbA1C: No results for input(s): HGBA1C in the last 72 hours. CBG: No results for input(s): GLUCAP in the last 168 hours.  Recent Results (from the past 240 hour(s))  Urine culture     Status: Abnormal   Collection Time: 04/14/21  4:29 PM   Specimen: Urine, Random  Result Value Ref Range Status   Specimen Description   Final    URINE, RANDOM Performed at [facility], [address], [place], [place] [address]    Special Requests   Final    NONE Performed at [facility], [address] [place], [place] [address]    Culture MULTIPLE SPECIES PRESENT, SUGGEST RECOLLECTION (A)  Final   Report Status 04/16/2021 FINAL  Final  SARS CORONAVIRUS 2 (TAT 6-24 HRS) Nasopharyngeal Nasopharyngeal Swab     Status: None   Collection Time: 04/14/21  7:10 PM   Specimen: Nasopharyngeal Swab  Result Value Ref Range Status   SARS Coronavirus 2 NEGATIVE NEGATIVE Final    Comment: (NOTE) SARS-CoV-2 target nucleic acids are NOT DETECTED.  The SARS-CoV-2 RNA is generally detectable in upper and lower respiratory specimens during the acute phase of infection. Negative results do not preclude SARS-CoV-2 infection, do not rule out co-infections with other pathogens, and should not be used as  the sole basis for treatment or other patient management decisions. Negative results must be combined with clinical observations, patient history, and epidemiological information. The expected result is Negative.  Fact Sheet for Patients: SugarRoll.be  Fact Sheet for Healthcare Providers: https://www.woods-mathews.com/  This test is not yet approved or cleared by the [place] FDA and  has been authorized for detection and/or diagnosis of SARS-CoV-2 by FDA under an Emergency Use Authorization (EUA). This EUA will remain  in effect (meaning this test can be used) for the duration of the COVID-19 declaration under Se ction 564(b)(1) of the Act, 21 U.S.C. section 360bbb-3(b)(1), unless the authorization is terminated or revoked sooner.  Performed at [facility],  [address], [place], [place] [address]   MRSA PCR Screening     Status: None   Collection Time: 04/14/21  9:11 PM   Specimen: Nasal Mucosa; Nasopharyngeal  Result Value Ref Range Status   MRSA by PCR NEGATIVE NEGATIVE Final    Comment:        The GeneXpert MRSA Assay (FDA approved for NASAL specimens only), is one component of a comprehensive MRSA colonization surveillance program. It is not intended to diagnose MRSA infection nor to guide or monitor treatment for MRSA infections. Performed at [facility], [address], [place], [place] [address]      Radiology Studies: MR BRAIN WO CONTRAST  Result Date: 04/16/2021 CLINICAL DATA:  Initial evaluation for mental status change, unknown cause. EXAM: MRI HEAD WITHOUT CONTRAST TECHNIQUE: Multiplanar, multiecho pulse sequences of the brain and surrounding structures were obtained without intravenous contrast. COMPARISON:  Prior CT from 04/14/2021. FINDINGS: Brain: Examination mildly degraded by motion artifact. Cerebral volume within normal limits for age. No significant cerebral white matter disease or  other focal parenchymal signal abnormality. No abnormal foci of restricted diffusion to suggest acute or subacute ischemia. Gray-white matter differentiation maintained. No encephalomalacia to suggest chronic cortical infarction. No evidence for acute intracranial hemorrhage. Single subcentimeter focus of susceptibility artifact noted involving the anterior left frontal lobe, likely a small chronic microhemorrhage, of doubtful significance in isolation. No mass lesion, midline shift or mass effect. No hydrocephalus or extra-axial fluid collection. Pituitary gland suprasellar region normal. Midline structures intact and normal. Vascular: Major intracranial vascular flow voids are maintained. Skull and upper cervical spine: Craniocervical junction within normal limits. Bone marrow signal intensity normal. No scalp soft tissue abnormality. Sinuses/Orbits: Patient status post ocular lens replacement on the left. Globes and orbital soft tissues demonstrate no acute finding. Paranasal sinuses are largely clear. No significant mastoid effusion. Inner ear structures grossly normal. Other: None. IMPRESSION: Normal brain MRI for age. No acute intracranial abnormality identified. Electronically Signed   By: Jeannine Boga M.D.   On: 04/16/2021 00:43     Marzetta Board, MD, PhD [facility]  Between 7 am - 7 pm I am available, please contact me via Amion (for emergencies) or Securechat (non urgent messages)  Between 7 pm - 7 am I am not available, please contact night coverage MD/APP via Amion

## 2021-04-16 NOTE — Discharge Summary (Deleted)
Physician Discharge Summary  Melissa Wise SLH:[id number] DOB: 12/22/1950 DOA: 04/14/2021  PCP: Pcp, No  Admit date: 04/14/2021 Discharge date: 04/16/2021  Admitted From: ALF Disposition:  ALF  Recommendations for Outpatient Follow-up:  Follow up with PCP in 1-2 weeks Please obtain BMP/CBC in one week Please follow up on the following pending results:  Home Health: PT, OT, RN Equipment/Devices: none  Discharge Condition: stable CODE STATUS: Full code Diet recommendation: regular  HPI: Per admitting MD, Melissa Wise is a 70 y.o. 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  She was reportedly walking with her eyes closed.  She was responding to sternal rub.  She reportedly had abdominal pain without specific localization.  According to the patient's sister, the patient has been more withdrawn lately and less interactive with both family and staff.  No reported fever or chills.  No reported nausea or vomiting or abdominal pain.  No chest pain or palpitations.  No reported cough or wheezing or dyspnea.  She did not have any reported dysuria, urinary frequency or urgency or flank pain.  The patient is a very poor historian due to her advanced dementia.  No new paresthesias or focal muscle weakness.    Hospital Course / Discharge diagnoses: Principal Problem Acute metabolic encephalopathy, possibly due to UTI-underlying dementia makes it hard for her to express her symptoms.  Patient has been placed on ceftriaxone, she was afebrile, no WBC and has completed a 3-day course while hospitalized.  An MRI of the brain was negative for acute strokes. Her episodes of being sleepy and poorly responsive are likely multifactorial, combination of her  progressive dementia/Parkinson's disease along with possible medication effect.  I have discontinued her Abilify as well as Exelon, consider adding again Seroquel in the evening time as an outpatient.  Medically she is stable, and can be discharged back to her assisted living and stable condition.  Recommend palliative evaluation and ongoing follow-up as an outpatient given progression of her dementia   Active Problems Parkinson's disease, underlying dementia-continue Sinemet Hyperlipidemia-continue home medications Hypertension-continue home medications  Sepsis ruled out   Discharge Instructions   Allergies as of 04/16/2021       Reactions   Amlodipine Swelling   Enalapril Cough        Medication List     STOP taking these medications    ARIPiprazole 5 MG tablet Commonly known as: ABILIFY   rivastigmine 9.5 mg/24hr Commonly known as: EXELON       TAKE these medications    carbidopa-levodopa 25-100 MG tablet Commonly known as: SINEMET IR Take 1 tablet by mouth 3 (three) times daily.   cholecalciferol 25 MCG (1000 UNIT) tablet Commonly known as: VITAMIN D3 Take 2,000 Units by mouth daily.   Co Q 10 10 MG Caps Take 1 capsule by mouth daily.   COCONUT OIL PO Take by mouth.   FIBER ADULT GUMMIES PO Take by mouth.   fish oil-omega-3 fatty acids 1000 MG capsule Take 1 tablet by mouth daily.   furosemide 20 MG tablet Commonly known as: LASIX Take 20 mg by mouth daily.   melatonin 5 MG Tabs Take 5 mg by mouth at bedtime.   multivitamin tablet Take 1 tablet by mouth daily.   TURMERIC PO Take 500 mg by mouth daily at 12 noon.  vitamin B-12 500 MCG tablet Commonly known as: CYANOCOBALAMIN Take 500 mcg by mouth daily.       ASK your doctor about these medications    lovastatin 40 MG tablet Commonly known as: MEVACOR TAKE 1 TABLET BY MOUTH AT BEDTIME         Consultations: None   Procedures/Studies:  CT Abdomen Pelvis Wo  Contrast  Result Date: 04/14/2021 CLINICAL DATA:  Abdominal pain with palpation. EXAM: CT ABDOMEN AND PELVIS WITHOUT CONTRAST TECHNIQUE: Multidetector CT imaging of the abdomen and pelvis was performed following the standard protocol without IV contrast. COMPARISON:  None. FINDINGS: Lower chest: No acute abnormality. Hepatobiliary: No focal liver abnormality is seen. Heterogeneous sludge is suspected within the gallbladder without evidence of gallstones, gallbladder wall thickening, or biliary dilatation. Pancreas: Unremarkable. No pancreatic ductal dilatation or surrounding inflammatory changes. Spleen: Normal in size without focal abnormality. Adrenals/Urinary Tract: Adrenal glands are unremarkable. Kidneys are normal in size, without renal calculi or hydronephrosis. A 2.1 cm x 1.6 cm cystic appearing area is seen within the anterolateral aspect of the upper pole of the right kidney bladder is unremarkable. Stomach/Bowel: Stomach is within normal limits. Appendix appears normal. Large amount of stool is seen throughout the colon. No evidence of bowel wall thickening, distention, or inflammatory changes. Vascular/Lymphatic: Aortic atherosclerosis. No enlarged abdominal or pelvic lymph nodes. Reproductive: Status post hysterectomy. No adnexal masses. Other: No abdominal wall hernia or abnormality. No abdominopelvic ascites. Musculoskeletal: Asymmetric enlargement of the right psoas muscle is seen (axial CT images 28 through 46, CT series 3). This is of indeterminate age. No associated inflammatory fat stranding is identified. Multilevel degenerative changes are seen throughout the lumbar spine. IMPRESSION: 1. Gallbladder sludge without cholelithiasis or acute cholecystitis. 2. Suspected simple left renal cysts. 3. Asymmetric right psoas muscle enlargement of indeterminate age. This is a nonspecific finding may be chronic in nature. Electronically Signed   By: Virgina Norfolk M.D.   On: 04/14/2021 15:17   DG  Chest 2 View  Result Date: 04/14/2021 CLINICAL DATA:  Weakness. EXAM: CHEST - 2 VIEW COMPARISON:  07/16/2016 FINDINGS: Heart size is normal. No pleural effusion or edema. No airspace opacities identified. Remote deformities involving the left scapula and distal left clavicle. There is also a remote deformity involving the proximal aspect of the right humerus. IMPRESSION: No acute cardiopulmonary abnormalities. Electronically Signed   By: Kerby Moors M.D.   On: 04/14/2021 15:20   CT Head Wo Contrast  Result Date: 04/14/2021 CLINICAL DATA:  Altered mental status. EXAM: CT HEAD WITHOUT CONTRAST TECHNIQUE: Contiguous axial images were obtained from the base of the skull through the vertex without intravenous contrast. COMPARISON:  Mar 18, 2021 FINDINGS: Brain: There is mild cerebral atrophy with widening of the extra-axial spaces and ventricular dilatation. There are areas of decreased attenuation within the white matter tracts of the supratentorial brain, consistent with microvascular disease changes. Vascular: No hyperdense vessel or unexpected calcification. Skull: Normal. Negative for fracture or focal lesion. Sinuses/Orbits: No acute finding. Other: None. IMPRESSION: 1. Generalized cerebral atrophy. 2. No acute intracranial abnormality. Electronically Signed   By: Virgina Norfolk M.D.   On: 04/14/2021 15:07   CT Head Wo Contrast  Result Date: 03/18/2021 CLINICAL DATA:  Altered mental status EXAM: CT HEAD WITHOUT CONTRAST TECHNIQUE: Contiguous axial images were obtained from the base of the skull through the vertex without intravenous contrast. COMPARISON:  August 15, 2020 FINDINGS: Brain: Age related volume loss is stable. There is no intracranial mass, hemorrhage, extra-axial  fluid collection, or midline shift. Brain parenchyma appears unremarkable. No acute infarct evident. Vascular: No hyperdense vessel. There is calcification in each carotid siphon region. Skull: Bony calvarium appears intact.  Sinuses/Orbits: Paranasal sinuses are clear. Patient is status post cataract removal on the left. Orbits otherwise appear symmetric bilaterally. Other: Mastoid air cells are clear. IMPRESSION: Normal appearing brain parenchyma. No evident acute infarct. No mass or hemorrhage. Foci of arterial vascular calcification noted. Electronically Signed   By: Lowella Grip III M.D.   On: 03/18/2021 13:44   MR BRAIN WO CONTRAST  Result Date: 04/16/2021 CLINICAL DATA:  Initial evaluation for mental status change, unknown cause. EXAM: MRI HEAD WITHOUT CONTRAST TECHNIQUE: Multiplanar, multiecho pulse sequences of the brain and surrounding structures were obtained without intravenous contrast. COMPARISON:  Prior CT from 04/14/2021. FINDINGS: Brain: Examination mildly degraded by motion artifact. Cerebral volume within normal limits for age. No significant cerebral white matter disease or other focal parenchymal signal abnormality. No abnormal foci of restricted diffusion to suggest acute or subacute ischemia. Gray-white matter differentiation maintained. No encephalomalacia to suggest chronic cortical infarction. No evidence for acute intracranial hemorrhage. Single subcentimeter focus of susceptibility artifact noted involving the anterior left frontal lobe, likely a small chronic microhemorrhage, of doubtful significance in isolation. No mass lesion, midline shift or mass effect. No hydrocephalus or extra-axial fluid collection. Pituitary gland suprasellar region normal. Midline structures intact and normal. Vascular: Major intracranial vascular flow voids are maintained. Skull and upper cervical spine: Craniocervical junction within normal limits. Bone marrow signal intensity normal. No scalp soft tissue abnormality. Sinuses/Orbits: Patient status post ocular lens replacement on the left. Globes and orbital soft tissues demonstrate no acute finding. Paranasal sinuses are largely clear. No significant mastoid effusion.  Inner ear structures grossly normal. Other: None. IMPRESSION: Normal brain MRI for age. No acute intracranial abnormality identified. Electronically Signed   By: Jeannine Boga M.D.   On: 04/16/2021 00:43     Subjective: No complaints  Discharge Exam: BP 119/69 (BP Location: Left Arm)   Pulse 80   Temp 98.2 F (36.8 C) (Oral)   Resp 18   Ht 5\' 5"  (1.651 m)   Wt 59 kg   SpO2 97%   BMI 21.63 kg/m   General: Pt is alert, awake, not in acute distress Cardiovascular: RRR, S1/S2 +, no rubs, no gallops Respiratory: CTA bilaterally, no wheezing, no rhonchi Abdominal: Soft, NT, ND, bowel sounds + Extremities: no edema, no cyanosis    The results of significant diagnostics from this hospitalization (including imaging, microbiology, ancillary and laboratory) are listed below for reference.     Microbiology: Recent Results (from the past 240 hour(s))  Urine culture     Status: Abnormal   Collection Time: 04/14/21  4:29 PM   Specimen: Urine, Random  Result Value Ref Range Status   Specimen Description   Final    URINE, RANDOM Performed at [facility], [address] [place], [place] [address]    Special Requests   Final    NONE Performed at [facility], [address] [place],  [address]    Culture MULTIPLE SPECIES PRESENT, SUGGEST RECOLLECTION (A)  Final   Report Status 04/16/2021 FINAL  Final  SARS CORONAVIRUS 2 (TAT 6-24 HRS) Nasopharyngeal Nasopharyngeal Swab     Status: None   Collection Time: 04/14/21  7:10 PM   Specimen: Nasopharyngeal Swab  Result Value Ref Range Status   SARS Coronavirus 2 NEGATIVE NEGATIVE Final    Comment: (NOTE) SARS-CoV-2  target nucleic acids are NOT DETECTED.  The SARS-CoV-2 RNA is generally detectable in upper and lower respiratory specimens during the acute phase of infection. Negative results do not preclude SARS-CoV-2 infection, do not rule out co-infections with other pathogens, and should not be used  as the sole basis for treatment or other patient management decisions. Negative results must be combined with clinical observations, patient history, and epidemiological information. The expected result is Negative.  Fact Sheet for Patients: SugarRoll.be  Fact Sheet for Healthcare Providers: https://www.woods-mathews.com/  This test is not yet approved or cleared by the [place] FDA and  has been authorized for detection and/or diagnosis of SARS-CoV-2 by FDA under an Emergency Use Authorization (EUA). This EUA will remain  in effect (meaning this test can be used) for the duration of the COVID-19 declaration under Se ction 564(b)(1) of the Act, 21 U.S.C. section 360bbb-3(b)(1), unless the authorization is terminated or revoked sooner.  Performed at [facility], [place] [address] [place], [place] [address]   MRSA PCR Screening     Status: None   Collection Time: 04/14/21  9:11 PM   Specimen: Nasal Mucosa; Nasopharyngeal  Result Value Ref Range Status   MRSA by PCR NEGATIVE NEGATIVE Final    Comment:        The GeneXpert MRSA Assay (FDA approved for NASAL specimens only), is one component of a comprehensive MRSA colonization surveillance program. It is not intended to diagnose MRSA infection nor to guide or monitor treatment for MRSA infections. Performed at [facility], [address] [place], [place] [address]      Labs: Basic Metabolic Panel: Recent Labs  Lab 04/14/21 1425 04/15/21 0554  NA 137 138  K 3.9 3.8  CL 103 103  CO2 30 26  GLUCOSE 99 87  BUN 17 10  CREATININE 0.60 0.51  CALCIUM 8.9 8.8*   Liver Function Tests: Recent Labs  Lab 04/14/21 1425  AST 20  ALT 9  ALKPHOS 63  BILITOT 0.9  PROT 6.7  ALBUMIN 4.0   CBC: Recent Labs  Lab 04/14/21 1425 04/15/21 0640  WBC 6.7 4.4  NEUTROABS 4.5  --   HGB 12.7 12.1  HCT 37.1 35.2*  MCV 86.7 86.9  PLT 172 129*   CBG: No results  for input(s): GLUCAP in the last 168 hours. Hgb A1c No results for input(s): HGBA1C in the last 72 hours. Lipid Profile No results for input(s): CHOL, HDL, LDLCALC, TRIG, CHOLHDL, LDLDIRECT in the last 72 hours. Thyroid function studies No results for input(s): TSH, T4TOTAL, T3FREE, THYROIDAB in the last 72 hours.  Invalid input(s): FREET3 Urinalysis    Component Value Date/Time   COLORURINE YELLOW (A) 04/14/2021 1629   APPEARANCEUR CLEAR (A) 04/14/2021 1629   APPEARANCEUR Clear 07/27/2019 1611   LABSPEC 1.013 04/14/2021 1629   PHURINE 7.0 04/14/2021 1629   GLUCOSEU NEGATIVE 04/14/2021 1629   HGBUR NEGATIVE 04/14/2021 1629   BILIRUBINUR NEGATIVE 04/14/2021 1629   BILIRUBINUR Negative 07/27/2019 1611   KETONESUR NEGATIVE 04/14/2021 1629   PROTEINUR NEGATIVE 04/14/2021 1629   NITRITE NEGATIVE 04/14/2021 1629   LEUKOCYTESUR MODERATE (A) 04/14/2021 1629    FURTHER DISCHARGE INSTRUCTIONS:   Get Medicines reviewed and adjusted: Please take all your medications with you for your next visit with your Primary MD   Laboratory/radiological data: Please request your Primary MD to go over all hospital tests and procedure/radiological results at the follow up, please ask your Primary MD to get all Hospital records sent  to his/her office.   In some cases, they will be blood work, cultures and biopsy results pending at the time of your discharge. Please request that your primary care M.D. goes through all the records of your hospital data and follows up on these results.   Also Note the following: If you experience worsening of your admission symptoms, develop shortness of breath, life threatening emergency, suicidal or homicidal thoughts you must seek medical attention immediately by calling 911 or calling your MD immediately  if symptoms less severe.   You must read complete instructions/literature along with all the possible adverse reactions/side effects for all the Medicines you take and  that have been prescribed to you. Take any new Medicines after you have completely understood and accpet all the possible adverse reactions/side effects.    Do not drive when taking Pain medications or sleeping medications (Benzodaizepines)   Do not take more than prescribed Pain, Sleep and Anxiety Medications. It is not advisable to combine anxiety,sleep and pain medications without talking with your primary care practitioner   Special Instructions: If you have smoked or chewed Tobacco  in the last 2 yrs please stop smoking, stop any regular Alcohol  and or any Recreational drug use.   Wear Seat belts while driving.   Please note: You were cared for by a hospitalist during your hospital stay. Once you are discharged, your primary care physician will handle any further medical issues. Please note that NO REFILLS for any discharge medications will be authorized once you are discharged, as it is imperative that you return to your primary care physician (or establish a relationship with a primary care physician if you do not have one) for your post hospital discharge needs so that they can reassess your need for medications and monitor your lab values.  Time coordinating discharge: 40 minutes  SIGNED:  Marzetta Board, MD, PhD 04/16/2021, 2:39 PM

## 2021-04-16 NOTE — Progress Notes (Signed)
Mobility Specialist - Progress Note    04/16/21 1500  Mobility  Range of Motion/Exercises Passive  Assistive Device None  Distance Ambulated (ft) 0 ft  Mobility Response Tolerated well  Mobility performed by Mobility specialist  $Mobility charge 1 Mobility   During mobility: 71 HR, 95% SpO2 Post-mobility: 66 HR, 96% SPO2  Pt laying supine upon arrival. Pt appeared lethargic this date. Pt awakes to touch, but minimally engaging. Pt did not open eyes to initiate OOB activity. Participated in passive supine exercises: ankle pumps 2 x 10, straight leg raises 2 x 10, and abduction 2 x 10. Pt restrictive with movement. Pt left in bed with family at bedside.   Kathee Delton Mobility Specialist 04/16/21, 3:22 PM

## 2021-04-16 NOTE — TOC Transition Note (Addendum)
Transition of Care [facility]) - CM/SW Discharge Note   Patient Details  Name: Melissa Wise MRN: [id number] Date of Birth: 1951/03/18  [facility] CM/SW Contact:  Kerin Salen, RN Phone Number: 04/16/2021, 2:43 PM   Clinical Narrative:  Patient to be discharged back to [facility], spoke with Admissions Gideon, [phone] who approved. Patient to have Home Health PT/OT/RN, orders to be written and Tammy will arrange with [facility]. Information discussed with Sister who voices understanding. I will fax discharge Summary and PT/OT notes to [phone]. TOC barriers resolved.    Discharge cancelled due to nurse concern about mobility, further evaluation to be done by PT, facility, Tammy notified of cancellation.    Final next level of care: Assisted Living Barriers to Discharge: Barriers Resolved   Patient Goals and CMS Choice Patient states their goals for this hospitalization and ongoing recovery are:: To return to [facility]   Choice offered to / list presented to : NA  Discharge Placement                Patient to be transferred to facility by: Sister Name of family member notified: [name] Patient and family notified of of transfer: 04/16/21  Discharge Plan and Services                DME Arranged: N/A DME Agency: NA                  Social Determinants of Health (SDOH) Interventions     Readmission Risk Interventions No flowsheet data found.

## 2021-04-16 NOTE — Progress Notes (Deleted)
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  She was reportedly walking with her eyes closed and occasionally minimally responsive.  According to the patient's sister she has been more withdrawn lately and less interactive with family and staff.  No reported symptoms however she is a poor historian due to her dementia  Subjective / 24h Interval events: Alternates between sleeping and being very alert  Assessment & Plan: Principal Problem Acute metabolic encephalopathy, possibly due to UTI-underlying dementia makes it hard for her to express her symptoms -Has been placed on ceftriaxone, continue while monitoring cultures -per sister she has been weaker and weaker recently. -facility cannot take her back currently since she is unable to stand and pivot independently, will pursue shirt term rehab  Active Problems Parkinson's disease, underlying dementia-continue Sinemet, Exelon patch, Abilify  Hyperlipidemia-continue statin  Hypertension-at home she is on Lasix  Scheduled Meds:  carbidopa-levodopa  1 tablet Oral TID   cholecalciferol  2,000 Units Oral Daily   enoxaparin (LOVENOX) injection  40 mg Subcutaneous Q24H   melatonin  5 mg Oral QHS   multivitamin with minerals  1 tablet Oral Daily   omega-3 acid ethyl esters  1 g Oral Daily   pravastatin  40 mg Oral q1800   vitamin B-12  500 mcg Oral Daily   Continuous Infusions:  cefTRIAXone (ROCEPHIN)  IV Stopped (04/15/21 1947)   PRN Meds:.acetaminophen **OR** acetaminophen, magnesium hydroxide, ondansetron **OR** ondansetron (ZOFRAN) IV, traZODone  Diet Orders (From admission, onward)     Start     Ordered   04/15/21 1144  DIET DYS 3 Room service appropriate? No;  Fluid consistency: Thin  Diet effective now       Comments: Extra Gravy on chopped/cut meats; cut spaghetti. Gravy on potatoes too. Please cut broccoli. Un-sweet Tea, water on trays.  Question Answer Comment  Room service appropriate? No   Fluid consistency: Thin      04/15/21 1143            DVT prophylaxis: enoxaparin (LOVENOX) injection 40 mg Start: 04/14/21 2200     Code Status: Full Code  Family Communication: sister at bedside   Status is: Inpatient  Remains inpatient appropriate because:Inpatient level of care appropriate due to severity of illness   Dispo: The patient is from: Home              Anticipated d/c is to: Home              Patient currently is not medically stable to d/c.   Difficult to place patient No   Level of care: Med-Surg  Consultants:  None   Procedures:  None   Microbiology  None   Antimicrobials: None     Objective: Vitals:   04/15/21 2328 04/16/21 0611 04/16/21 0739 04/16/21 1124  BP: 100/73 138/85 128/87 119/69  Pulse: 68 78 79 80  Resp: 16 20 20 18   Temp: 97.6 F (36.4 C) 97.9 F (36.6 C) 97.8 F (36.6 C) 98.2 F (36.8 C)  TempSrc: Oral Oral Oral Oral  SpO2: 98% 98% 99% 97%  Weight:      Height:        Intake/Output Summary (Last 24 hours) at 04/16/2021 1417 Last data filed at 04/16/2021 1406 Gross per 24 hour  Intake  2413.98 ml  Output 300 ml  Net 2113.98 ml    Filed Weights   04/14/21 1358 04/14/21 1402  Weight: 63.5 kg 59 kg    Examination:  Constitutional: nad Eyes: no icterus ENMT: mmm Neck: normal, supple Respiratory: cta biL, no wheezing Cardiovascular: rrr, no mrg, no edema Abdomen: soft, nt, nd, bs+ Musculoskeletal: no clubbing / cyanosis.  Skin: no rashes Neurologic: non focal   Data Reviewed: I have independently reviewed following labs and imaging studies   CBC: Recent Labs  Lab 04/14/21 1425 04/15/21 0640  WBC 6.7 4.4  NEUTROABS 4.5  --   HGB 12.7 12.1  HCT 37.1 35.2*  MCV  86.7 86.9  PLT 172 129*    Basic Metabolic Panel: Recent Labs  Lab 04/14/21 1425 04/15/21 0554  NA 137 138  K 3.9 3.8  CL 103 103  CO2 30 26  GLUCOSE 99 87  BUN 17 10  CREATININE 0.60 0.51  CALCIUM 8.9 8.8*    Liver Function Tests: Recent Labs  Lab 04/14/21 1425  AST 20  ALT 9  ALKPHOS 63  BILITOT 0.9  PROT 6.7  ALBUMIN 4.0    Coagulation Profile: No results for input(s): INR, PROTIME in the last 168 hours. HbA1C: No results for input(s): HGBA1C in the last 72 hours. CBG: No results for input(s): GLUCAP in the last 168 hours.  Recent Results (from the past 240 hour(s))  Urine culture     Status: Abnormal   Collection Time: 04/14/21  4:29 PM   Specimen: Urine, Random  Result Value Ref Range Status   Specimen Description   Final    URINE, RANDOM Performed at [facility], [address] [place], [place] [address]    Special Requests   Final    NONE Performed at [facility], [address] [place], [place] [address]    Culture MULTIPLE SPECIES PRESENT, SUGGEST RECOLLECTION (A)  Final   Report Status 04/16/2021 FINAL  Final  SARS CORONAVIRUS 2 (TAT 6-24 HRS) Nasopharyngeal Nasopharyngeal Swab     Status: None   Collection Time: 04/14/21  7:10 PM   Specimen: Nasopharyngeal Swab  Result Value Ref Range Status   SARS Coronavirus 2 NEGATIVE NEGATIVE Final    Comment: (NOTE) SARS-CoV-2 target nucleic acids are NOT DETECTED.  The SARS-CoV-2 RNA is generally detectable in upper and lower respiratory specimens during the acute phase of infection. Negative results do not preclude SARS-CoV-2 infection, do not rule out co-infections with other pathogens, and should not be used as the sole basis for treatment or other patient management decisions. Negative results must be combined with clinical observations, patient history, and epidemiological information. The expected result is Negative.  Fact Sheet for  Patients: SugarRoll.be  Fact Sheet for Healthcare Providers: https://www.woods-mathews.com/  This test is not yet approved or cleared by the [place] FDA and  has been authorized for detection and/or diagnosis of SARS-CoV-2 by FDA under an Emergency Use Authorization (EUA). This EUA will remain  in effect (meaning this test can be used) for the duration of the COVID-19 declaration under Se ction 564(b)(1) of the Act, 21 U.S.C. section 360bbb-3(b)(1), unless the authorization is terminated or revoked sooner.  Performed at [facility], [place] [address] [place], [place] [address]   MRSA PCR Screening     Status: None   Collection Time: 04/14/21  9:11 PM   Specimen: Nasal Mucosa; Nasopharyngeal  Result Value Ref Range Status   MRSA by PCR NEGATIVE NEGATIVE  Final    Comment:        The GeneXpert MRSA Assay (FDA approved for NASAL specimens only), is one component of a comprehensive MRSA colonization surveillance program. It is not intended to diagnose MRSA infection nor to guide or monitor treatment for MRSA infections. Performed at [facility], [address] [place], [place] [address]      Radiology Studies: MR BRAIN WO CONTRAST  Result Date: 04/16/2021 CLINICAL DATA:  Initial evaluation for mental status change, unknown cause. EXAM: MRI HEAD WITHOUT CONTRAST TECHNIQUE: Multiplanar, multiecho pulse sequences of the brain and surrounding structures were obtained without intravenous contrast. COMPARISON:  Prior CT from 04/14/2021. FINDINGS: Brain: Examination mildly degraded by motion artifact. Cerebral volume within normal limits for age. No significant cerebral white matter disease or other focal parenchymal signal abnormality. No abnormal foci of restricted diffusion to suggest acute or subacute ischemia. Gray-white matter differentiation maintained. No encephalomalacia to suggest chronic cortical infarction. No  evidence for acute intracranial hemorrhage. Single subcentimeter focus of susceptibility artifact noted involving the anterior left frontal lobe, likely a small chronic microhemorrhage, of doubtful significance in isolation. No mass lesion, midline shift or mass effect. No hydrocephalus or extra-axial fluid collection. Pituitary gland suprasellar region normal. Midline structures intact and normal. Vascular: Major intracranial vascular flow voids are maintained. Skull and upper cervical spine: Craniocervical junction within normal limits. Bone marrow signal intensity normal. No scalp soft tissue abnormality. Sinuses/Orbits: Patient status post ocular lens replacement on the left. Globes and orbital soft tissues demonstrate no acute finding. Paranasal sinuses are largely clear. No significant mastoid effusion. Inner ear structures grossly normal. Other: None. IMPRESSION: Normal brain MRI for age. No acute intracranial abnormality identified. Electronically Signed   By: Jeannine Boga M.D.   On: 04/16/2021 00:43       Marzetta Board, MD, PhD [facility]  Between 7 am - 7 pm I am available, please contact me via Amion (for emergencies) or Securechat (non urgent messages)  Between 7 pm - 7 am I am not available, please contact night coverage MD/APP via Amion

## 2021-04-17 NOTE — Progress Notes (Addendum)
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  She was reportedly walking with her eyes closed and occasionally minimally responsive.  According to the patient's sister she has been more withdrawn lately and less interactive with family and staff.  No reported symptoms however she is a poor historian due to her dementia  Subjective / 24h Interval events: Alert, underlying dementia, no complaints  Assessment & Plan: Principal Problem Acute metabolic encephalopathy, UTI ruled out-underlying dementia makes it hard for her to express her symptoms -Has been placed on ceftriaxone and finished 3 doses but cultures have remained negative -per sister she has been weaker and weaker recently. -facility cannot take her back currently since she is unable to stand and pivot independently, PT recommends SNF, placement pending -her episodes of being sleepy and poorly responsive are likely multifactorial, combination of her progressive dementia/Parkinson's disease along with possible medication effect.  I have discontinued her Abilify as well as Exelon  Active Problems Parkinson's disease, underlying dementia-continue Sinemet,    Hyperlipidemia-continue statin   Hypertension-at home she is on Lasix  Scheduled Meds:  carbidopa-levodopa  1 tablet Oral TID   cholecalciferol  2,000 Units Oral Daily   enoxaparin (LOVENOX) injection  40 mg Subcutaneous Q24H   melatonin  5 mg Oral QHS   multivitamin with minerals  1 tablet Oral Daily   omega-3 acid ethyl esters  1 g Oral Daily   pravastatin  40 mg Oral q1800   vitamin B-12  500 mcg Oral Daily   Continuous Infusions:   PRN Meds:.acetaminophen **OR** acetaminophen, magnesium hydroxide,  ondansetron **OR** ondansetron (ZOFRAN) IV, traZODone  Diet Orders (From admission, onward)     Start     Ordered   04/15/21 1144  DIET DYS 3 Room service appropriate? No; Fluid consistency: Thin  Diet effective now       Comments: Extra Gravy on chopped/cut meats; cut spaghetti. Gravy on potatoes too. Please cut broccoli. Un-sweet Tea, water on trays.  Question Answer Comment  Room service appropriate? No   Fluid consistency: Thin      04/15/21 1143            DVT prophylaxis: enoxaparin (LOVENOX) injection 40 mg Start: 04/14/21 2200     Code Status: Full Code  Family Communication: sister at bedside   Status is: Inpatient  Remains inpatient appropriate because:Unsafe d/c plan and Inpatient level of care appropriate due to severity of illness   Dispo: The patient is from: Home              Anticipated d/c is to: Home              Patient currently is medically stable to d/c.   Difficult to place patient No   Level of care: Med-Surg  Consultants:  None   Procedures:  None   Microbiology  None   Antimicrobials: None     Objective: Vitals:   04/16/21 1124 04/16/21 1535 04/16/21 2033 04/17/21 0607  BP: 119/69 117/74 (!) 152/93 (!) 153/81  Pulse: 80 65 75 69  Resp: 18 16 18 18   Temp: 98.2 F (36.8 C) 98.3 F (36.8 C) 98.4 F (36.9 C) 97.6 F (36.4 C)  TempSrc: Oral Axillary Oral Oral  SpO2: 97% 99% 99% 98%  Weight:  Height:        Intake/Output Summary (Last 24 hours) at 04/17/2021 1131 Last data filed at 04/17/2021 0912 Gross per 24 hour  Intake 740 ml  Output 300 ml  Net 440 ml    Filed Weights   04/14/21 1358 04/14/21 1402  Weight: 63.5 kg 59 kg    Examination:  Constitutional: NAD Respiratory: Clear bilaterally Cardiovascular: Regular rate and rhythm, no murmurs   Data Reviewed: I have independently reviewed following labs and imaging studies   CBC: Recent Labs  Lab 04/14/21 1425 04/15/21 0640  WBC 6.7 4.4  NEUTROABS  4.5  --   HGB 12.7 12.1  HCT 37.1 35.2*  MCV 86.7 86.9  PLT 172 129*    Basic Metabolic Panel: Recent Labs  Lab 04/14/21 1425 04/15/21 0554  NA 137 138  K 3.9 3.8  CL 103 103  CO2 30 26  GLUCOSE 99 87  BUN 17 10  CREATININE 0.60 0.51  CALCIUM 8.9 8.8*    Liver Function Tests: Recent Labs  Lab 04/14/21 1425  AST 20  ALT 9  ALKPHOS 63  BILITOT 0.9  PROT 6.7  ALBUMIN 4.0    Coagulation Profile: No results for input(s): INR, PROTIME in the last 168 hours. HbA1C: No results for input(s): HGBA1C in the last 72 hours. CBG: No results for input(s): GLUCAP in the last 168 hours.  Recent Results (from the past 240 hour(s))  Urine culture     Status: Abnormal   Collection Time: 04/14/21  4:29 PM   Specimen: Urine, Random  Result Value Ref Range Status   Specimen Description   Final    URINE, RANDOM Performed at [facility], [address] [place], [place] [address]    Special Requests   Final    NONE Performed at [facility], [address] [place], [place] [address]    Culture MULTIPLE SPECIES PRESENT, SUGGEST RECOLLECTION (A)  Final   Report Status 04/16/2021 FINAL  Final  SARS CORONAVIRUS 2 (TAT 6-24 HRS) Nasopharyngeal Nasopharyngeal Swab     Status: None   Collection Time: 04/14/21  7:10 PM   Specimen: Nasopharyngeal Swab  Result Value Ref Range Status   SARS Coronavirus 2 NEGATIVE NEGATIVE Final    Comment: (NOTE) SARS-CoV-2 target nucleic acids are NOT DETECTED.  The SARS-CoV-2 RNA is generally detectable in upper and lower respiratory specimens during the acute phase of infection. Negative results do not preclude SARS-CoV-2 infection, do not rule out co-infections with other pathogens, and should not be used as the sole basis for treatment or other patient management decisions. Negative results must be combined with clinical observations, patient history, and epidemiological information. The expected result is  Negative.  Fact Sheet for Patients: SugarRoll.be  Fact Sheet for Healthcare Providers: https://www.woods-mathews.com/  This test is not yet approved or cleared by the [place] FDA and  has been authorized for detection and/or diagnosis of SARS-CoV-2 by FDA under an Emergency Use Authorization (EUA). This EUA will remain  in effect (meaning this test can be used) for the duration of the COVID-19 declaration under Se ction 564(b)(1) of the Act, 21 U.S.C. section 360bbb-3(b)(1), unless the authorization is terminated or revoked sooner.  Performed at [facility], [place] [address] [place], [place] [address]   MRSA PCR Screening     Status: None   Collection Time: 04/14/21  9:11 PM   Specimen: Nasal Mucosa; Nasopharyngeal  Result Value Ref Range Status   MRSA by PCR  NEGATIVE NEGATIVE Final    Comment:        The GeneXpert MRSA Assay (FDA approved for NASAL specimens only), is one component of a comprehensive MRSA colonization surveillance program. It is not intended to diagnose MRSA infection nor to guide or monitor treatment for MRSA infections. Performed at [facility], [address] [place], [place] [address]      Radiology Studies: No results found.   Marzetta Board, MD, PhD [facility]  Between 7 am - 7 pm I am available, please contact me via Amion (for emergencies) or Securechat (non urgent messages)  Between 7 pm - 7 am I am not available, please contact night coverage MD/APP via Amion

## 2021-04-17 NOTE — TOC Progression Note (Addendum)
Transition of Care [facility]) - Progression Note    Patient Details  Name: Melissa Wise MRN: [id number] Date of Birth: 10-Jun-1951  Transition of Care [facility]) CM/SW [place], LCSW Phone Number: 04/17/2021, 2:50 PM  Clinical Narrative:   Reached out to Tina/Tammy at [facility] to ask if they have reviewed patient. Will follow for bed offers.  3:15- Bed offer at [facility]. Starting insurance auth in [facility] portal. Asked when they will have a bed available, waiting on response.    Expected Discharge Plan: [place] Barriers to Discharge: Continued Medical Work up  Expected Discharge Plan and Services Expected Discharge Plan: [place] In-house Referral: NA   Post Acute Care Choice: [place] Living arrangements for the past 2 months: [place] Expected Discharge Date: 04/16/21               DME Arranged: N/A DME Agency: NA                   Social Determinants of Health (SDOH) Interventions    Readmission Risk Interventions No flowsheet data found.

## 2021-04-17 NOTE — NC FL2 (Signed)
[place] LEVEL OF CARE SCREENING TOOL     IDENTIFICATION  Patient Name: Melissa Wise Birthdate: 11-Apr-1951 Sex: female Admission Date (Current Location): 04/14/2021  [facility] and [place] Number:  Engineering geologist and Address:  [facility], [address], [place], [place] [address]      Provider Number: [id number]  Attending Physician Name and Address:  Caren Griffins, MD  Relative Name and Phone Number:       Current Level of Care: Hospital Recommended Level of Care: [place] Prior Approval Number:    Date Approved/Denied:   PASRR Number: [id number]  Discharge Plan: SNF    Current Diagnoses: Patient Active Problem List   Diagnosis Date Noted   UTI (urinary tract infection) 04/14/2021   Depression, recurrent ([place]) 12/19/2018   Peripheral edema 10/04/2016   Parkinson disease ([place]) 09/03/2016   Hypertension 02/04/2016   Hyperlipidemia 02/04/2016    Orientation RESPIRATION BLADDER Height & Weight     Self  Normal Continent Weight: 130 lb (59 kg) Height:  5\' 5"  (165.1 cm)  BEHAVIORAL SYMPTOMS/MOOD NEUROLOGICAL BOWEL NUTRITION STATUS      Continent Diet (DYS3 thin liquids)  AMBULATORY STATUS COMMUNICATION OF NEEDS Skin   Extensive Assist Verbally Other (Comment) (open wound on left breast)                       Personal Care Assistance Level of Assistance  Bathing, Feeding, Dressing Bathing Assistance: Maximum assistance Feeding assistance: Independent Dressing Assistance: Maximum assistance     Functional Limitations Info  Sight, Hearing, Speech Sight Info: Adequate Hearing Info: Adequate Speech Info: Adequate    SPECIAL CARE FACTORS FREQUENCY  PT (By licensed PT), OT (By licensed OT)     PT Frequency: 5x OT Frequency: 5x            Contractures Contractures Info: Not present    Additional Factors Info  Code Status, Allergies Code Status Info: Full Allergies Info:  Amlodipine, Enalapril           Current Medications (04/17/2021):  This is the current hospital active medication list Current Facility-Administered Medications  Medication Dose Route Frequency Provider Last Rate Last Admin   acetaminophen (TYLENOL) tablet 650 mg  650 mg Oral Q6H PRN Mansy, Jan A, MD       Or   acetaminophen (TYLENOL) suppository 650 mg  650 mg Rectal Q6H PRN Mansy, Jan A, MD       carbidopa-levodopa (SINEMET IR) 25-100 MG per tablet immediate release 1 tablet  1 tablet Oral TID Mansy, Jan A, MD   1 tablet at 04/17/21 3335   cholecalciferol (VITAMIN D3) tablet 2,000 Units  2,000 Units Oral Daily Mansy, Jan A, MD   2,000 Units at 04/17/21 0929   enoxaparin (LOVENOX) injection 40 mg  40 mg Subcutaneous Q24H Mansy, Jan A, MD   40 mg at 04/16/21 2110   magnesium hydroxide (MILK OF MAGNESIA) suspension 30 mL  30 mL Oral Daily PRN Mansy, Jan A, MD       melatonin tablet 5 mg  5 mg Oral QHS Mansy, Jan A, MD   5 mg at 04/16/21 2105   multivitamin with minerals tablet 1 tablet  1 tablet Oral Daily Mansy, Jan A, MD   1 tablet at 04/17/21 0929   omega-3 acid ethyl esters (LOVAZA) capsule 1 g  1 g Oral Daily Mansy, Jan A, MD   1 g at 04/17/21 [phone]  ondansetron (ZOFRAN) tablet 4 mg  4 mg Oral Q6H PRN Mansy, Jan A, MD       Or   ondansetron [facility]) injection 4 mg  4 mg Intravenous Q6H PRN Mansy, Jan A, MD       pravastatin (PRAVACHOL) tablet 40 mg  40 mg Oral q1800 Mansy, Jan A, MD   40 mg at 04/16/21 1742   traZODone (DESYREL) tablet 25 mg  25 mg Oral QHS PRN Mansy, Jan A, MD       vitamin B-12 (CYANOCOBALAMIN) tablet 500 mcg  500 mcg Oral Daily Mansy, Jan A, MD   500 mcg at 04/17/21 3299     Discharge Medications: Please see discharge summary for a list of discharge medications.  Relevant Imaging Results:  Relevant Lab Results:   Additional Information [phone]  Gerrianne Scale Lydian Chavous, LCSW

## 2021-04-17 NOTE — TOC Initial Note (Signed)
Transition of Care [facility]) - Initial/Assessment Note    Patient Details  Name: Melissa Wise MRN: [id number] Date of Birth: [birth year]  Transition [facility]) CM/SW Contact:    Eileen Stanford, LCSW Phone Number: 04/17/2021, 11:52 AM  Clinical Narrative:    CSW spoke with pt's daughter and confirmed pt is from [facility]. Pt's daughter states she is aware they will not take her back and pt will need SNF. Pt's daughter is requesting [facility]. Referral sent.               Expected Discharge Plan: Skilled Nursing Facility Barriers to Discharge: Continued Medical Work up   Patient Goals and CMS Choice Patient states their goals for this hospitalization and ongoing recovery are:: to go to snf   Choice offered to / list presented to : Adult Children  Expected Discharge Plan and Services Expected Discharge Plan: [place] In-house Referral: NA   Post Acute Care Choice: [place] Living arrangements for the past 2 months: [place] Expected Discharge Date: 04/16/21               DME Arranged: N/A DME Agency: NA                  Prior Living Arrangements/Services Living arrangements for the past 2 months: [place]   Patient language and need for interpreter reviewed:: Yes Do you feel safe going back to the place where you live?: Yes      Need for Family Participation in Patient Care: Yes (Comment) Care giver support system in place?: Yes (comment)   Criminal Activity/Legal Involvement Pertinent to Current Situation/Hospitalization: No - Comment as needed  Activities of Daily Living Home Assistive Devices/Equipment: Walker (specify type) ADL Screening (condition at time of admission) Patient's cognitive ability adequate to safely complete daily activities?: No Is the patient deaf or have difficulty hearing?: Yes Does the patient have difficulty seeing, even when wearing glasses/contacts?: No Does the  patient have difficulty concentrating, remembering, or making decisions?: Yes Patient able to express need for assistance with ADLs?: No Does the patient have difficulty dressing or bathing?: Yes Independently performs ADLs?: No Communication: Needs assistance Is this a change from baseline?: Pre-admission baseline Dressing (OT): Needs assistance Is this a change from baseline?: Pre-admission baseline Grooming: Needs assistance Is this a change from baseline?: Pre-admission baseline Feeding: Needs assistance Is this a change from baseline?: Pre-admission baseline Bathing: Needs assistance Is this a change from baseline?: Pre-admission baseline Toileting: Needs assistance Is this a change from baseline?: Pre-admission baseline Does the patient have difficulty walking or climbing stairs?: Yes Weakness of Legs: Both Weakness of Arms/Hands: None  Permission Sought/Granted Permission sought to share information with : Family Supports    Share Information with NAME: sue  Permission granted to share info w AGENCY: [facility]  Permission granted to share info w Relationship: daughter     Emotional Assessment Appearance:: Appears stated age Attitude/Demeanor/Rapport: Unable to Assess Affect (typically observed): Unable to Assess Orientation: : Oriented to Self Alcohol / Substance Use: Not Applicable Psych Involvement: No (comment)  Admission diagnosis:  UTI (urinary tract infection) [N39.0] Acute cystitis without hematuria [N30.00] Altered mental status, unspecified altered mental status type [R41.82] Patient Active Problem List   Diagnosis Date Noted   UTI (urinary tract infection) 04/14/2021   Depression, recurrent ([place]) 12/19/2018   Peripheral edema 10/04/2016   Parkinson disease ([place]) 09/03/2016   Hypertension 02/04/2016   Hyperlipidemia 02/04/2016   PCP:  Pcp, No Pharmacy:   [facility] [place], [place] - [address] [place] [address] Phone:  [phone] Fax: [phone]  [id number] [address], [address] [place] [address] Phone: [phone] Fax: [phone]  [facility] - [place], [place] - [address] [place] [address] Phone: [phone] Fax: [phone]     Social Determinants of Health (SDOH) Interventions    Readmission Risk Interventions No flowsheet data found.

## 2021-04-17 NOTE — TOC Progression Note (Signed)
Transition of Care [facility]) - Progression Note    Patient Details  Name: Melissa Wise MRN: [id number] Date of Birth: 15-Jun-1951  Transition of Care [facility]) CM/SW Contact  Eileen Stanford, LCSW Phone Number: 04/17/2021, 11:39 AM  Clinical Narrative:  ALF did not accept pt back yesterday because she is too weak for that level of care. CSW called pt's daughter [name] to discuss SNF options however had to leave a voicemail.       Barriers to Discharge: Barriers Resolved  Expected Discharge Plan and Services           Expected Discharge Date: 04/16/21               DME Arranged: N/A DME Agency: NA                   Social Determinants of Health (SDOH) Interventions    Readmission Risk Interventions No flowsheet data found.

## 2021-04-17 NOTE — Progress Notes (Addendum)
Speech Language Pathology Treatment: Dysphagia  Patient Details Name: Melissa Wise MRN: [id number] DOB: Mar 26, 1951 Today's Date: 04/17/2021 Time: 7017-7939 SLP Time Calculation (min) (ACUTE ONLY): 35 min    Assessment / Plan / Recommendation Clinical Impression  Pt seen for ongoing assessment of swallowing. She appears much more alert/awake than at initial evaluation. Pt was verbal, tangential statements. She was able to follow basic instructions w/ verbal/tactile cues/guide. NSG reported good toleration of oral diet as long as distractions reduced and in upright positioning(rec'd general aspiration precautions per eval). Pt is on RA; afebrile.  Pt explained general aspiration precautions and given hands-on explanation re: taking small sips slowly; sitting upright to eat/drink - Pt assisted w/ positioning d/t weakness. Pt nodded but unsure of comprehension so continued monitoring given during po trials.  Pt consumed trials of thin liquids and purees w/ no overt clinical s/s of aspiration noted w/ any consistency; respiratory status remained calm and unlabored, vocal quality clear b/t trials. Pt held own Cup when drinking once given setup; appeared to follow instructions for single, small sips slowly - intermittent verbal cues given w/ light tactile stim on cup to take a break. Oral phase appeared [facility] for bolus management and timely A-P transfer for swallowing; oral clearing achieved w/ all consistencies.  Suspect pt appears close to/at her baseline re: swallowing. She benefits from support at meals to self-engage and initiate feeding tasks and monitoring during meals to reduce risk for aspiration.  Recommend a more Mech Soft consistency diet w/ well-Cut meats, moistened foods; Thin liquids. Recommend general aspiration precautions, Support at meals to setup trays and help pt engage/initiate self-feeding. Reduce distractions at meals. Supervision at meals for follow through. Pills CRUSHED in Puree  for safer, easier swallowing d/t Cognitive decline. Education given on Pills in Puree; food consistencies and easy to eat options; general aspiration precautions to NSG/pt. NSG agreed. Recommend Dietician f/u for support.       HPI HPI: Pt is a 70 y.o. 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  She was reportedly walking with her eyes closed.  She reportedly had abdominal pain without specific localization.  According to the patient's sister, the patient has been more withdrawn lately and less interactive with both family and staff.   CXR: No acute cardiopulmonary abnormalities.  Head CT: Generalized cerebral atrophy. 2. No acute intracranial abnormality.  Pt admitted w/ dx of Acute metabolic encephalopathy likely secondary to urinary tract infection; baseline Parkinson's Dis. and Dementia.      SLP Plan  All goals met       Recommendations  Diet recommendations: Dysphagia 3 (mechanical soft);Thin liquid (chopped/cut meats w/ gravies) Liquids provided via: Cup;Straw (monitor) Medication Administration: Crushed with puree (for safer swallowing) Supervision: Patient able to self feed;Intermittent supervision to cue for compensatory strategies (setup support) Compensations: Minimize environmental distractions;Slow rate;Small sips/bites;Follow solids with liquid Postural Changes and/or Swallow Maneuvers: Out of bed for meals;Seated upright 90 degrees;Upright 30-60 min after meal                General recommendations:  (Dietician f/u for support) Oral Care Recommendations: Oral care BID;Oral care before and after PO;Staff/trained caregiver to provide oral care Follow up Recommendations: None SLP Visit Diagnosis: Dysphagia, oral phase  (R13.11) (baseline Cognitive decline) Plan: All goals met  GO                 Melissa Kenner, MS, CCC-SLP Speech Language Pathologist Rehab Services [phone] [facility] 04/17/2021, 10:25 AM

## 2021-04-17 NOTE — Care Management Important Message (Signed)
Important Message  Patient Details  Name: Aldea Avis MRN: [id number] Date of Birth: 04-Jan-1951   Medicare Important Message Given:  Yes  Reviewed Medicare IM with [name], sister via phone at [phone].  Copy of Medicare IM left in patient's room for reference.    Dannette Barbara 04/17/2021, 11:38 AM

## 2021-04-18 NOTE — Progress Notes (Signed)
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  She was reportedly walking with her eyes closed and occasionally minimally responsive.  According to the patient's sister she has been more withdrawn lately and less interactive with family and staff.  No reported symptoms however she is a poor historian due to her dementia  Subjective / 24h Interval events: Alert, underlying dementia, no complaints  Assessment & Plan: Principal Problem Acute metabolic encephalopathy, UTI ruled out-underlying dementia makes it hard for her to express her symptoms -Has been placed on ceftriaxone and finished 3 doses but cultures have remained negative -per sister she has been weaker and weaker recently. -facility cannot take her back currently since she is unable to stand and pivot independently, PT recommends SNF, placement pending -her episodes of being sleepy and poorly responsive are likely multifactorial, combination of her progressive dementia/Parkinson's disease along with possible medication effect.  I have discontinued her Abilify as well as Exelon  Active Problems Parkinson's disease, underlying dementia-continue Sinemet,    Hyperlipidemia-continue statin   Hypertension-at home she is on Lasix  Scheduled Meds:  carbidopa-levodopa  1 tablet Oral TID   cholecalciferol  2,000 Units Oral Daily   enoxaparin (LOVENOX) injection  40 mg Subcutaneous Q24H   melatonin  5 mg Oral QHS   multivitamin with minerals  1 tablet Oral Daily   omega-3 acid ethyl esters  1 g Oral Daily   pravastatin  40 mg Oral q1800   vitamin B-12  500 mcg Oral Daily   Continuous Infusions:   PRN Meds:.acetaminophen **OR** acetaminophen, magnesium hydroxide,  ondansetron **OR** ondansetron (ZOFRAN) IV, traZODone  Diet Orders (From admission, onward)     Start     Ordered   04/15/21 1144  DIET DYS 3 Room service appropriate? No; Fluid consistency: Thin  Diet effective now       Comments: Extra Gravy on chopped/cut meats; cut spaghetti. Gravy on potatoes too. Please cut broccoli. Un-sweet Tea, water on trays.  Question Answer Comment  Room service appropriate? No   Fluid consistency: Thin      04/15/21 1143            DVT prophylaxis: enoxaparin (LOVENOX) injection 40 mg Start: 04/14/21 2200     Code Status: Full Code  Family Communication: sister at bedside   Status is: Inpatient  Remains inpatient appropriate because:Unsafe d/c plan and Inpatient level of care appropriate due to severity of illness   Dispo: The patient is from: Home              Anticipated d/c is to: Home              Patient currently is medically stable to d/c.   Difficult to place patient No   Level of care: Med-Surg  Consultants:  None   Procedures:  None   Microbiology  None   Antimicrobials: None     Objective: Vitals:   04/17/21 2213 04/18/21 0553 04/18/21 0940 04/18/21 1126  BP: (!) 178/108 134/84 124/79 108/61  Pulse: 79 71 85 79  Resp: 16 18 17 16   Temp: 97.6 F (36.4 C) 97.6 F (36.4 C) 98.1 F (36.7 C) 98.1 F (36.7 C)  TempSrc:   Axillary Axillary  SpO2: 98% 100% 99% 100%  Weight:  Height:        Intake/Output Summary (Last 24 hours) at 04/18/2021 1210 Last data filed at 04/17/2021 2224 Gross per 24 hour  Intake 240 ml  Output 200 ml  Net 40 ml    Filed Weights   04/14/21 1358 04/14/21 1402  Weight: 63.5 kg 59 kg    Examination:  Constitutional: nad Respiratory: clear Cardiovascular: rrr   Data Reviewed: I have independently reviewed following labs and imaging studies   CBC: Recent Labs  Lab 04/14/21 1425 04/15/21 0640  WBC 6.7 4.4  NEUTROABS 4.5  --   HGB 12.7 12.1  HCT 37.1 35.2*  MCV  86.7 86.9  PLT 172 129*    Basic Metabolic Panel: Recent Labs  Lab 04/14/21 1425 04/15/21 0554  NA 137 138  K 3.9 3.8  CL 103 103  CO2 30 26  GLUCOSE 99 87  BUN 17 10  CREATININE 0.60 0.51  CALCIUM 8.9 8.8*    Liver Function Tests: Recent Labs  Lab 04/14/21 1425  AST 20  ALT 9  ALKPHOS 63  BILITOT 0.9  PROT 6.7  ALBUMIN 4.0    Coagulation Profile: No results for input(s): INR, PROTIME in the last 168 hours. HbA1C: No results for input(s): HGBA1C in the last 72 hours. CBG: No results for input(s): GLUCAP in the last 168 hours.  Recent Results (from the past 240 hour(s))  Urine culture     Status: Abnormal   Collection Time: 04/14/21  4:29 PM   Specimen: Urine, Random  Result Value Ref Range Status   Specimen Description   Final    URINE, RANDOM Performed at [facility], [address] [place], [place] [address]    Special Requests   Final    NONE Performed at [facility], [address] [place], [place] [address]    Culture MULTIPLE SPECIES PRESENT, SUGGEST RECOLLECTION (A)  Final   Report Status 04/16/2021 FINAL  Final  SARS CORONAVIRUS 2 (TAT 6-24 HRS) Nasopharyngeal Nasopharyngeal Swab     Status: None   Collection Time: 04/14/21  7:10 PM   Specimen: Nasopharyngeal Swab  Result Value Ref Range Status   SARS Coronavirus 2 NEGATIVE NEGATIVE Final    Comment: (NOTE) SARS-CoV-2 target nucleic acids are NOT DETECTED.  The SARS-CoV-2 RNA is generally detectable in upper and lower respiratory specimens during the acute phase of infection. Negative results do not preclude SARS-CoV-2 infection, do not rule out co-infections with other pathogens, and should not be used as the sole basis for treatment or other patient management decisions. Negative results must be combined with clinical observations, patient history, and epidemiological information. The expected result is Negative.  Fact Sheet for  Patients: SugarRoll.be  Fact Sheet for Healthcare Providers: https://www.woods-mathews.com/  This test is not yet approved or cleared by the [place] FDA and  has been authorized for detection and/or diagnosis of SARS-CoV-2 by FDA under an Emergency Use Authorization (EUA). This EUA will remain  in effect (meaning this test can be used) for the duration of the COVID-19 declaration under Se ction 564(b)(1) of the Act, 21 U.S.C. section 360bbb-3(b)(1), unless the authorization is terminated or revoked sooner.  Performed at [facility], [place] [address] [place], [place] [address]   MRSA PCR Screening     Status: None   Collection Time: 04/14/21  9:11 PM   Specimen: Nasal Mucosa; Nasopharyngeal  Result Value Ref Range Status   MRSA by PCR NEGATIVE NEGATIVE Final  Comment:        The GeneXpert MRSA Assay (FDA approved for NASAL specimens only), is one component of a comprehensive MRSA colonization surveillance program. It is not intended to diagnose MRSA infection nor to guide or monitor treatment for MRSA infections. Performed at [facility], [address], [place],  [address]      Radiology Studies: No results found.   Marzetta Board, MD, PhD [facility]  Between 7 am - 7 pm I am available, please contact me via Amion (for emergencies) or Securechat (non urgent messages)  Between 7 pm - 7 am I am not available, please contact night coverage MD/APP via Amion

## 2021-04-18 NOTE — TOC Progression Note (Addendum)
Transition of Care [facility]) - Progression Note    Patient Details  Name: Melissa Wise MRN: [id number] Date of Birth: [birth year]  Transition of Care [facility]) CM/SW [place], LCSW Phone Number: 04/18/2021, 9:59 AM  Clinical Narrative:   Left VM for [facility] at [facility] to inquire when they will have a bed available for patient.  Insurance auth still pending in [place].  Called patient's sister Melissa Wise with update. She understands we have to wait for bed availability, may not be over the weekend.  12:54- Called Melissa [facility] at [facility]. No bed for this patient until Monday.   1:52- Call from [facility], she said the authorization is going to be sent to their Medical Director and she will call CSW tomorrow or RNCM on Monday (number provided) with an update.   Expected Discharge Plan: [place] Barriers to Discharge: Continued Medical Work up  Expected Discharge Plan and Services Expected Discharge Plan: [place] In-house Referral: NA   Post Acute Care Choice: [place] Living arrangements for the past 2 months: [place] Expected Discharge Date: 04/16/21               DME Arranged: N/A DME Agency: NA                   Social Determinants of Health (SDOH) Interventions    Readmission Risk Interventions No flowsheet data found.

## 2021-04-18 NOTE — Plan of Care (Signed)
Continuing with plan of care. 

## 2021-04-19 DIAGNOSIS — Z7189 Other specified counseling: Secondary | ICD-10-CM

## 2021-04-19 NOTE — TOC Progression Note (Addendum)
Transition of Care [facility]) - Progression Note    Patient Details  Name: Evangeline Utley MRN: [id number] Date of Birth: 1951/01/20  Transition of Care [facility]) CM/SW [place], LCSW Phone Number: 04/19/2021, 10:42 AM  Clinical Narrative:   Confirmed with Tina at [facility] that patient is on her list for admission tomorrow. Insurance Josem Kaufmann is approved in [place] portal through 6/15.    Expected Discharge Plan: [place] Barriers to Discharge: Continued Medical Work up  Expected Discharge Plan and Services Expected Discharge Plan: [place] In-house Referral: NA   Post Acute Care Choice: [place] Living arrangements for the past 2 months: [place] Expected Discharge Date: 04/16/21               DME Arranged: N/A DME Agency: NA                   Social Determinants of Health (SDOH) Interventions    Readmission Risk Interventions No flowsheet data found.

## 2021-04-19 NOTE — Plan of Care (Signed)
Continuing with plan of care. 

## 2021-04-19 NOTE — Progress Notes (Addendum)
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  She was reportedly walking with her eyes closed and occasionally minimally responsive.  According to the patient's sister she has been more withdrawn lately and less interactive with family and staff.  No reported symptoms however she is a poor historian due to her dementia  Subjective / 24h Interval events: No complaints, alert, sister at bedside   Assessment & Plan: Principal Problem Acute metabolic encephalopathy, UTI ruled out-underlying dementia makes it hard for her to express her symptoms -Has been placed on ceftriaxone and finished 3 doses but cultures have remained negative -per sister she has been weaker and weaker recently. -facility cannot take her back currently since she is unable to stand and pivot independently, PT recommends SNF, placement pending -her episodes of being sleepy and poorly responsive are likely multifactorial, combination of her progressive dementia/Parkinson's disease along with possible medication effect.  I have discontinued her Abilify as well as Exelon  Active Problems Parkinson's disease, underlying dementia-continue Sinemet,    Hyperlipidemia-continue statin   Hypertension-at home she is on Lasix  Goals of care - d/w with sister at bedside in detail about resuscitative efforts in case of arrest and she indicates that her sister would not have wanted CPR ot being on a vent. Change to DNR  Scheduled Meds:  carbidopa-levodopa  1 tablet Oral TID   cholecalciferol  2,000 Units Oral Daily   enoxaparin (LOVENOX) injection  40 mg Subcutaneous Q24H   melatonin  5 mg Oral QHS   multivitamin with minerals  1 tablet Oral Daily   omega-3 acid ethyl  esters  1 g Oral Daily   pravastatin  40 mg Oral q1800   vitamin B-12  500 mcg Oral Daily   Continuous Infusions:   PRN Meds:.acetaminophen **OR** acetaminophen, magnesium hydroxide, ondansetron **OR** ondansetron (ZOFRAN) IV, traZODone  Diet Orders (From admission, onward)     Start     Ordered   04/15/21 1144  DIET DYS 3 Room service appropriate? No; Fluid consistency: Thin  Diet effective now       Comments: Extra Gravy on chopped/cut meats; cut spaghetti. Gravy on potatoes too. Please cut broccoli. Un-sweet Tea, water on trays.  Question Answer Comment  Room service appropriate? No   Fluid consistency: Thin      04/15/21 1143            DVT prophylaxis: enoxaparin (LOVENOX) injection 40 mg Start: 04/14/21 2200     Code Status: Full Code  Family Communication: sister at bedside   Status is: Inpatient  Remains inpatient appropriate because:Unsafe d/c plan and Inpatient level of care appropriate due to severity of illness   Dispo: The patient is from: Home              Anticipated d/c is to: Home              Patient currently is medically stable to d/c.   Difficult to place patient No   Level of care: Med-Surg  Consultants:  None   Procedures:  None   Microbiology  None   Antimicrobials: None     Objective: Vitals:   04/18/21 1950 04/18/21 2225 04/19/21 0439 04/19/21 1117  BP: (!) 157/85 (!) 149/98 138/65 138/78  Pulse: 80 83 78 81  Resp: 16  18 16   Temp: 98.2 F (36.8 C) 99.4 F (37.4 C) 98.6 F (37 C) 97.8 F (36.6 C)  TempSrc: Oral Oral Oral Oral  SpO2: 99% 93% 98%   Weight:      Height:       No intake or output data in the 24 hours ending 04/19/21 1135  Filed Weights   04/14/21 1358 04/14/21 1402  Weight: 63.5 kg 59 kg    Examination:  Constitutional: NAD Eyes: lids and conjunctivae normal, no scleral icterus ENMT: mmm Neck: normal, supple Respiratory: clear to auscultation bilaterally, no wheezing, no crackles. Normal  respiratory effort.  Cardiovascular: Regular rate and rhythm, no murmurs / rubs / gallops. No LE edema. Abdomen: soft, no distention, no tenderness. Bowel sounds positive.  Skin: no rashes Neurologic: no focal deficits, equal strength   Data Reviewed: I have independently reviewed following labs and imaging studies   CBC: Recent Labs  Lab 04/14/21 1425 04/15/21 0640  WBC 6.7 4.4  NEUTROABS 4.5  --   HGB 12.7 12.1  HCT 37.1 35.2*  MCV 86.7 86.9  PLT 172 129*    Basic Metabolic Panel: Recent Labs  Lab 04/14/21 1425 04/15/21 0554  NA 137 138  K 3.9 3.8  CL 103 103  CO2 30 26  GLUCOSE 99 87  BUN 17 10  CREATININE 0.60 0.51  CALCIUM 8.9 8.8*    Liver Function Tests: Recent Labs  Lab 04/14/21 1425  AST 20  ALT 9  ALKPHOS 63  BILITOT 0.9  PROT 6.7  ALBUMIN 4.0    Coagulation Profile: No results for input(s): INR, PROTIME in the last 168 hours. HbA1C: No results for input(s): HGBA1C in the last 72 hours. CBG: No results for input(s): GLUCAP in the last 168 hours.  Recent Results (from the past 240 hour(s))  Urine culture     Status: Abnormal   Collection Time: 04/14/21  4:29 PM   Specimen: Urine, Random  Result Value Ref Range Status   Specimen Description   Final    URINE, RANDOM Performed at [facility], [address], [place], [place] [address]    Special Requests   Final    NONE Performed at [facility], [address] [place], [place] [address]    Culture MULTIPLE SPECIES PRESENT, SUGGEST RECOLLECTION (A)  Final   Report Status 04/16/2021 FINAL  Final  SARS CORONAVIRUS 2 (TAT 6-24 HRS) Nasopharyngeal Nasopharyngeal Swab     Status: None   Collection Time: 04/14/21  7:10 PM   Specimen: Nasopharyngeal Swab  Result Value Ref Range Status   SARS Coronavirus 2 NEGATIVE NEGATIVE Final    Comment: (NOTE) SARS-CoV-2 target nucleic acids are NOT DETECTED.  The SARS-CoV-2 RNA is generally detectable in upper and  lower respiratory specimens during the acute phase of infection. Negative results do not preclude SARS-CoV-2 infection, do not rule out co-infections with other pathogens, and should not be used as the sole basis for treatment or other patient management decisions. Negative results must be combined with clinical observations, patient history, and epidemiological information. The expected result is Negative.  Fact Sheet for Patients: SugarRoll.be  Fact Sheet for Healthcare Providers: https://www.woods-mathews.com/  This test is not yet approved or cleared by the [place] FDA and  has been authorized for detection and/or diagnosis of SARS-CoV-2 by FDA under an Emergency Use Authorization (EUA). This EUA will remain  in effect (meaning this test can be used) for the duration of the COVID-19  declaration under Se ction 564(b)(1) of the Act, 21 U.S.C. section 360bbb-3(b)(1), unless the authorization is terminated or revoked sooner.  Performed at [facility], [place] [address] [place], [place] [address]   MRSA PCR Screening     Status: None   Collection Time: 04/14/21  9:11 PM   Specimen: Nasal Mucosa; Nasopharyngeal  Result Value Ref Range Status   MRSA by PCR NEGATIVE NEGATIVE Final    Comment:        The GeneXpert MRSA Assay (FDA approved for NASAL specimens only), is one component of a comprehensive MRSA colonization surveillance program. It is not intended to diagnose MRSA infection nor to guide or monitor treatment for MRSA infections. Performed at [facility], [address] [place], [place] [address]      Radiology Studies: No results found.   Marzetta Board, MD, PhD [facility]  Between 7 am - 7 pm I am available, please contact me via Amion (for emergencies) or Securechat (non urgent messages)  Between 7 pm - 7 am I am not available, please contact night coverage MD/APP via Amion

## 2021-04-20 DIAGNOSIS — E559 Vitamin D deficiency, unspecified: Secondary | ICD-10-CM | POA: Diagnosis not present

## 2021-04-20 DIAGNOSIS — Z736 Limitation of activities due to disability: Secondary | ICD-10-CM | POA: Diagnosis not present

## 2021-04-20 DIAGNOSIS — G4700 Insomnia, unspecified: Secondary | ICD-10-CM | POA: Diagnosis not present

## 2021-04-20 DIAGNOSIS — N3000 Acute cystitis without hematuria: Secondary | ICD-10-CM | POA: Diagnosis not present

## 2021-04-20 DIAGNOSIS — G20 Parkinson's disease: Secondary | ICD-10-CM | POA: Diagnosis not present

## 2021-04-20 DIAGNOSIS — E119 Type 2 diabetes mellitus without complications: Secondary | ICD-10-CM | POA: Diagnosis not present

## 2021-04-20 DIAGNOSIS — R2681 Unsteadiness on feet: Secondary | ICD-10-CM | POA: Diagnosis not present

## 2021-04-20 DIAGNOSIS — R4182 Altered mental status, unspecified: Secondary | ICD-10-CM | POA: Diagnosis not present

## 2021-04-20 DIAGNOSIS — E785 Hyperlipidemia, unspecified: Secondary | ICD-10-CM | POA: Diagnosis not present

## 2021-04-20 DIAGNOSIS — E038 Other specified hypothyroidism: Secondary | ICD-10-CM | POA: Diagnosis not present

## 2021-04-20 DIAGNOSIS — R404 Transient alteration of awareness: Secondary | ICD-10-CM | POA: Diagnosis not present

## 2021-04-20 DIAGNOSIS — N179 Acute kidney failure, unspecified: Secondary | ICD-10-CM | POA: Diagnosis not present

## 2021-04-20 DIAGNOSIS — K219 Gastro-esophageal reflux disease without esophagitis: Secondary | ICD-10-CM | POA: Diagnosis not present

## 2021-04-20 DIAGNOSIS — E569 Vitamin deficiency, unspecified: Secondary | ICD-10-CM | POA: Diagnosis not present

## 2021-04-20 DIAGNOSIS — G9341 Metabolic encephalopathy: Secondary | ICD-10-CM | POA: Diagnosis not present

## 2021-04-20 DIAGNOSIS — M6281 Muscle weakness (generalized): Secondary | ICD-10-CM | POA: Diagnosis not present

## 2021-04-20 DIAGNOSIS — E875 Hyperkalemia: Secondary | ICD-10-CM | POA: Diagnosis not present

## 2021-04-20 DIAGNOSIS — D519 Vitamin B12 deficiency anemia, unspecified: Secondary | ICD-10-CM | POA: Diagnosis not present

## 2021-04-20 DIAGNOSIS — D649 Anemia, unspecified: Secondary | ICD-10-CM | POA: Diagnosis not present

## 2021-04-20 DIAGNOSIS — D518 Other vitamin B12 deficiency anemias: Secondary | ICD-10-CM | POA: Diagnosis not present

## 2021-04-20 DIAGNOSIS — R279 Unspecified lack of coordination: Secondary | ICD-10-CM | POA: Diagnosis not present

## 2021-04-20 DIAGNOSIS — Z743 Need for continuous supervision: Secondary | ICD-10-CM | POA: Diagnosis not present

## 2021-04-20 DIAGNOSIS — D696 Thrombocytopenia, unspecified: Secondary | ICD-10-CM | POA: Diagnosis not present

## 2021-04-20 DIAGNOSIS — I10 Essential (primary) hypertension: Secondary | ICD-10-CM | POA: Diagnosis not present

## 2021-04-20 DIAGNOSIS — N390 Urinary tract infection, site not specified: Secondary | ICD-10-CM | POA: Diagnosis not present

## 2021-04-20 LAB — RESP PANEL BY RT-PCR (FLU A&B, COVID) ARPGX2
Influenza A by PCR: NEGATIVE
Influenza B by PCR: NEGATIVE
SARS Coronavirus 2 by RT PCR: NEGATIVE

## 2021-04-20 NOTE — Progress Notes (Signed)
Occupational Therapy Treatment Patient Details Name: Melissa Wise MRN: [id number] DOB: 08/07/1951 Today's Date: 04/20/2021    History of present illness 70 y.o. 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  She was reportedly walking with her eyes closed.  She was responding to sternal rub.  She reportedly had abdominal pain without specific localization.  According to the patient's sister, the patient has been more withdrawn lately and less interactive with both family and staff.   OT comments  Upon entering the room, pt supine in bed and sleeping soundly. Pt mumbles responses but does not open eyes. Total A to use wash cloth for face in an attempt to alert pt to therapeutic intervention. OT assisting pt with oral care as mouth also looks very dry and uncomfortable. OT set up breakfast tray but unable to alert pt further  for participation and pt never opening eyes and shuts them tighter. Pt repositioned in bed with all needs within reach. Bed alarm activated and call bell within reach.    Follow Up Recommendations  Supervision/Assistance - 24 hour;SNF          Precautions / Restrictions Precautions Precautions: Fall Restrictions Weight Bearing Restrictions: No              ADL either performed or assessed with clinical judgement   ADL                       General ADL Comments: max- total A this session secondary to decreased attention and lethargy     Vision Patient Visual Report: No change from baseline            Cognition Arousal/Alertness: Lethargic Behavior During Therapy: Flat affect Overall Cognitive Status: History of cognitive impairments - at baseline                                 General  Comments: max cuing and total A                   Pertinent Vitals/ Pain       Pain Assessment: Faces Faces Pain Scale: No hurt         Frequency  Min 2X/week        Progress Toward Goals  OT Goals(current goals can now be found in the care plan section)  Progress towards OT goals: OT to reassess next treatment  Acute Rehab OT Goals Patient Stated Goal: none stated OT Goal Formulation: Patient unable to participate in goal setting Time For Goal Achievement: 04/29/21 Potential to Achieve Goals: [place] Discharge plan remains appropriate;Frequency remains appropriate       AM-PAC OT "6 Clicks" Daily Activity     Outcome Measure   Help from another person eating meals?: A Lot Help from another person taking care of personal grooming?: A Lot Help from another person toileting, which includes using toliet, bedpan, or urinal?: Total Help from another person bathing (including washing, rinsing, drying)?: Total Help from another person to put on and taking off regular upper body clothing?: Total Help from another person to put on and taking off regular lower body clothing?: Total 6 Click Score: 8    End of Session    OT  Visit Diagnosis: Unsteadiness on feet (R26.81);Muscle weakness (generalized) (M62.81)   Activity Tolerance Patient limited by lethargy   Patient Left in bed;with call bell/phone within reach;with bed alarm set   Nurse Communication Mobility status;Other (comment) (lethargy)        Time: 0955-1010 OT Time Calculation (min): 15 min  Charges: OT General Charges $OT Visit: 1 Visit OT Treatments $Self Care/Home Management : 8-22 mins  Darleen Crocker, MS, OTR/L , CBIS ascom [phone]  04/20/21, 12:49 PM

## 2021-04-20 NOTE — Care Management Important Message (Signed)
Important Message  Patient Details  Name: Melissa Wise MRN: [id number] Date of Birth: 21-Jun-1951   Medicare Important Message Given:  Yes  Reviewed Medicare IM with Doristine Church, sister via phone.  Confirmed she received copy of Medicare IM left in patient's room on 6/10.     Dannette Barbara 04/20/2021, 8:20 AM

## 2021-04-20 NOTE — TOC Transition Note (Signed)
Transition of Care [facility]) - CM/SW Discharge Note   Patient Details  Name: Melissa Wise MRN: [id number] Date of Birth: [birth year]  [facility] CM/SW Contact:  Eileen Stanford, LCSW Phone Number: 04/20/2021, 10:57 AM   Clinical Narrative:   Clinical Social Worker facilitated patient discharge including contacting patient family and facility to confirm patient discharge plans.  Clinical information faxed to facility and family agreeable with plan.  CSW arranged ambulance transport via [facility] to [facility].  RN to call [phone] (ask for 600 hall RN) for report prior to discharge.      Final next level of care: Skilled Nursing Facility Barriers to Discharge: No Barriers Identified   Patient Goals and CMS Choice Patient states their goals for this hospitalization and ongoing recovery are:: to go to snf   Choice offered to / list presented to : Adult Children  Discharge Placement              Patient chooses bed at:  ([facility]) Patient to be transferred to facility by: [facility] Name of family member notified: [name] Patient and family notified of of transfer: 04/20/21  Discharge Plan and Services In-house Referral: NA   Post Acute Care Choice: [facility]          DME Arranged: N/A DME Agency: NA                  Social Determinants of Health (SDOH) Interventions     Readmission Risk Interventions No flowsheet data found.

## 2021-04-20 NOTE — Discharge Summary (Signed)
Physician Discharge Summary  Melissa Wise AST:[id number] DOB: [birth year] DOA: 04/14/2021  PCP: Pcp, No  Admit date: 04/14/2021 Discharge date: 04/20/2021  Admitted From: ALF Disposition:  SNF  Recommendations for Outpatient Follow-up:  Follow up with PCP in 1-2 weeks  Home Health: none Equipment/Devices: none  Discharge Condition: stable CODE STATUS: DNT Diet recommendation: regular  HPI: Per admitting MD, Melissa Wise is a 70 y.o. 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  She was reportedly walking with her eyes closed.  She was responding to sternal rub.  She reportedly had abdominal pain without specific localization.  According to the patient's sister, the patient has been more withdrawn lately and less interactive with both family and staff.  No reported fever or chills.  No reported nausea or vomiting or abdominal pain.  No chest pain or palpitations.  No reported cough or wheezing or dyspnea.  She did not have any reported dysuria, urinary frequency or urgency or flank pain.  The patient is a very poor historian due to her advanced dementia.  No new paresthesias or focal muscle weakness.  Hospital Course / Discharge diagnoses: Principal Problem Acute metabolic encephalopathy, UTI ruled out-underlying dementia makes it hard for her to express her symptoms, she has been placed on ceftriaxone and finished 3 doses but cultures have remained negative. Per sister she has been weaker and weaker recently. PT recommends SNF. Her episodes of being sleepy and poorly responsive are likely multifactorial, combination of her progressive dementia/Parkinson's disease along with possible medication effect.  I have discontinued her Abilify as  well as Exelon   Active Problems Parkinson's disease, underlying dementia-continue Sinemet, Hyperlipidemia-continue statin Hypertension-at home she is on Lasix Goals of care - DNR, recommend palliative follow up as an outpatient  Sepsis ruled out   Discharge Instructions   Allergies as of 04/20/2021       Reactions   Amlodipine Swelling   Enalapril Cough        Medication List     STOP taking these medications    ARIPiprazole 5 MG tablet Commonly known as: ABILIFY   rivastigmine 9.5 mg/24hr Commonly known as: EXELON       TAKE these medications    carbidopa-levodopa 25-100 MG tablet Commonly known as: SINEMET IR Take 1 tablet by mouth 3 (three) times daily.   cholecalciferol 25 MCG (1000 UNIT) tablet Commonly known as: VITAMIN D3 Take 2,000 Units by mouth daily.   Co Q 10 10 MG Caps Take 1 capsule by mouth daily.   COCONUT OIL PO Take by mouth.   FIBER ADULT GUMMIES PO Take by mouth.   fish oil-omega-3 fatty acids 1000 MG capsule Take 1 tablet by mouth daily.   furosemide 20 MG tablet Commonly known as: LASIX Take 20 mg by mouth daily.   melatonin 5 MG Tabs Take 5 mg by mouth at bedtime.   multivitamin tablet Take 1 tablet by mouth daily.   TURMERIC PO Take 500 mg by mouth daily at 12 noon.   vitamin B-12 500 MCG tablet Commonly known as: CYANOCOBALAMIN Take 500 mcg by mouth daily.       ASK your doctor about these medications    lovastatin 40 MG tablet Commonly known as: MEVACOR TAKE 1 TABLET BY MOUTH AT BEDTIME  Consultations: None   Procedures/Studies:  CT Abdomen Pelvis Wo Contrast  Result Date: 04/14/2021 CLINICAL DATA:  Abdominal pain with palpation. EXAM: CT ABDOMEN AND PELVIS WITHOUT CONTRAST TECHNIQUE: Multidetector CT imaging of the abdomen and pelvis was performed following the standard protocol without IV contrast. COMPARISON:  None. FINDINGS: Lower chest: No acute abnormality. Hepatobiliary: No focal liver  abnormality is seen. Heterogeneous sludge is suspected within the gallbladder without evidence of gallstones, gallbladder wall thickening, or biliary dilatation. Pancreas: Unremarkable. No pancreatic ductal dilatation or surrounding inflammatory changes. Spleen: Normal in size without focal abnormality. Adrenals/Urinary Tract: Adrenal glands are unremarkable. Kidneys are normal in size, without renal calculi or hydronephrosis. A 2.1 cm x 1.6 cm cystic appearing area is seen within the anterolateral aspect of the upper pole of the right kidney bladder is unremarkable. Stomach/Bowel: Stomach is within normal limits. Appendix appears normal. Large amount of stool is seen throughout the colon. No evidence of bowel wall thickening, distention, or inflammatory changes. Vascular/Lymphatic: Aortic atherosclerosis. No enlarged abdominal or pelvic lymph nodes. Reproductive: Status post hysterectomy. No adnexal masses. Other: No abdominal wall hernia or abnormality. No abdominopelvic ascites. Musculoskeletal: Asymmetric enlargement of the right psoas muscle is seen (axial CT images 28 through 46, CT series 3). This is of indeterminate age. No associated inflammatory fat stranding is identified. Multilevel degenerative changes are seen throughout the lumbar spine. IMPRESSION: 1. Gallbladder sludge without cholelithiasis or acute cholecystitis. 2. Suspected simple left renal cysts. 3. Asymmetric right psoas muscle enlargement of indeterminate age. This is a nonspecific finding may be chronic in nature. Electronically Signed   By: Virgina Norfolk M.D.   On: 04/14/2021 15:17   DG Chest 2 View  Result Date: 04/14/2021 CLINICAL DATA:  Weakness. EXAM: CHEST - 2 VIEW COMPARISON:  07/16/2016 FINDINGS: Heart size is normal. No pleural effusion or edema. No airspace opacities identified. Remote deformities involving the left scapula and distal left clavicle. There is also a remote deformity involving the proximal aspect of the right  humerus. IMPRESSION: No acute cardiopulmonary abnormalities. Electronically Signed   By: Kerby Moors M.D.   On: 04/14/2021 15:20   CT Head Wo Contrast  Result Date: 04/14/2021 CLINICAL DATA:  Altered mental status. EXAM: CT HEAD WITHOUT CONTRAST TECHNIQUE: Contiguous axial images were obtained from the base of the skull through the vertex without intravenous contrast. COMPARISON:  Mar 18, 2021 FINDINGS: Brain: There is mild cerebral atrophy with widening of the extra-axial spaces and ventricular dilatation. There are areas of decreased attenuation within the white matter tracts of the supratentorial brain, consistent with microvascular disease changes. Vascular: No hyperdense vessel or unexpected calcification. Skull: Normal. Negative for fracture or focal lesion. Sinuses/Orbits: No acute finding. Other: None. IMPRESSION: 1. Generalized cerebral atrophy. 2. No acute intracranial abnormality. Electronically Signed   By: Virgina Norfolk M.D.   On: 04/14/2021 15:07   MR BRAIN WO CONTRAST  Result Date: 04/16/2021 CLINICAL DATA:  Initial evaluation for mental status change, unknown cause. EXAM: MRI HEAD WITHOUT CONTRAST TECHNIQUE: Multiplanar, multiecho pulse sequences of the brain and surrounding structures were obtained without intravenous contrast. COMPARISON:  Prior CT from 04/14/2021. FINDINGS: Brain: Examination mildly degraded by motion artifact. Cerebral volume within normal limits for age. No significant cerebral white matter disease or other focal parenchymal signal abnormality. No abnormal foci of restricted diffusion to suggest acute or subacute ischemia. Gray-white matter differentiation maintained. No encephalomalacia to suggest chronic cortical infarction. No evidence for acute intracranial hemorrhage. Single subcentimeter focus of susceptibility artifact noted involving  the anterior left frontal lobe, likely a small chronic microhemorrhage, of doubtful significance in isolation. No mass lesion,  midline shift or mass effect. No hydrocephalus or extra-axial fluid collection. Pituitary gland suprasellar region normal. Midline structures intact and normal. Vascular: Major intracranial vascular flow voids are maintained. Skull and upper cervical spine: Craniocervical junction within normal limits. Bone marrow signal intensity normal. No scalp soft tissue abnormality. Sinuses/Orbits: Patient status post ocular lens replacement on the left. Globes and orbital soft tissues demonstrate no acute finding. Paranasal sinuses are largely clear. No significant mastoid effusion. Inner ear structures grossly normal. Other: None. IMPRESSION: Normal brain MRI for age. No acute intracranial abnormality identified. Electronically Signed   By: Jeannine Boga M.D.   On: 04/16/2021 00:43     Subjective: - no chest pain, shortness of breath, no abdominal pain, nausea or vomiting.   Discharge Exam: BP 92/65 (BP Location: Left Arm)   Pulse 78   Temp 97.7 F (36.5 C) (Oral)   Resp 20   Ht 5\' 5"  (1.651 m)   Wt 59 kg   SpO2 99%   BMI 21.63 kg/m   General: Pt is alert, awake, not in acute distress Cardiovascular: RRR, S1/S2 +, no rubs, no gallops Respiratory: CTA bilaterally, no wheezing, no rhonchi Abdominal: Soft, NT, ND, bowel sounds + Extremities: no edema, no cyanosis   The results of significant diagnostics from this hospitalization (including imaging, microbiology, ancillary and laboratory) are listed below for reference.     Microbiology: Recent Results (from the past 240 hour(s))  Urine culture     Status: Abnormal   Collection Time: 04/14/21  4:29 PM   Specimen: Urine, Random  Result Value Ref Range Status   Specimen Description   Final    URINE, RANDOM Performed at [facility], [address], [place], [place] [address]    Special Requests   Final    NONE Performed at [facility], [address] [place], [place] [address]    Culture MULTIPLE SPECIES  PRESENT, SUGGEST RECOLLECTION (A)  Final   Report Status 04/16/2021 FINAL  Final  SARS CORONAVIRUS 2 (TAT 6-24 HRS) Nasopharyngeal Nasopharyngeal Swab     Status: None   Collection Time: 04/14/21  7:10 PM   Specimen: Nasopharyngeal Swab  Result Value Ref Range Status   SARS Coronavirus 2 NEGATIVE NEGATIVE Final    Comment: (NOTE) SARS-CoV-2 target nucleic acids are NOT DETECTED.  The SARS-CoV-2 RNA is generally detectable in upper and lower respiratory specimens during the acute phase of infection. Negative results do not preclude SARS-CoV-2 infection, do not rule out co-infections with other pathogens, and should not be used as the sole basis for treatment or other patient management decisions. Negative results must be combined with clinical observations, patient history, and epidemiological information. The expected result is Negative.  Fact Sheet for Patients: SugarRoll.be  Fact Sheet for Healthcare Providers: https://www.woods-mathews.com/  This test is not yet approved or cleared by the [place] FDA and  has been authorized for detection and/or diagnosis of SARS-CoV-2 by FDA under an Emergency Use Authorization (EUA). This EUA will remain  in effect (meaning this test can be used) for the duration of the COVID-19 declaration under Se ction 564(b)(1) of the Act, 21 U.S.C. section 360bbb-3(b)(1), unless the authorization is terminated or revoked sooner.  Performed at [facility], [place] [address], [place], [place] [address]   MRSA PCR Screening     Status: None   Collection Time: 04/14/21  9:11 PM   Specimen: Nasal Mucosa; Nasopharyngeal  Result Value Ref Range Status   MRSA by PCR NEGATIVE NEGATIVE Final    Comment:        The GeneXpert MRSA Assay (FDA approved for NASAL specimens only), is one component of a comprehensive MRSA colonization surveillance program. It is not intended to diagnose MRSA infection nor to  guide or monitor treatment for MRSA infections. Performed at [facility], [place], [place] [address]   Resp Panel by RT-PCR (Flu A&B, Covid) Nasopharyngeal Swab     Status: None   Collection Time: 04/20/21  7:05 AM   Specimen: Nasopharyngeal Swab; Nasopharyngeal(NP) swabs in vial transport medium  Result Value Ref Range Status   SARS Coronavirus 2 by RT PCR NEGATIVE NEGATIVE Final    Comment: (NOTE) SARS-CoV-2 target nucleic acids are NOT DETECTED.  The SARS-CoV-2 RNA is generally detectable in upper respiratory specimens during the acute phase of infection. The lowest concentration of SARS-CoV-2 viral copies this assay can detect is 138 copies/mL. A negative result does not preclude SARS-Cov-2 infection and should not be used as the sole basis for treatment or other patient management decisions. A negative result may occur with  improper specimen collection/handling, submission of specimen other than nasopharyngeal swab, presence of viral mutation(s) within the areas targeted by this assay, and inadequate number of viral copies(<138 copies/mL). A negative result must be combined with clinical observations, patient history, and epidemiological information. The expected result is Negative.  Fact Sheet for Patients:  EntrepreneurPulse.com.au  Fact Sheet for Healthcare Providers:  IncredibleEmployment.be  This test is no t yet approved or cleared by the [place] FDA and  has been authorized for detection and/or diagnosis of SARS-CoV-2 by FDA under an Emergency Use Authorization (EUA). This EUA will remain  in effect (meaning this test can be used) for the duration of the COVID-19 declaration under Section 564(b)(1) of the Act, 21 U.S.C.section 360bbb-3(b)(1), unless the authorization is terminated  or revoked sooner.       Influenza A by PCR NEGATIVE NEGATIVE Final   Influenza B by PCR NEGATIVE NEGATIVE Final     Comment: (NOTE) The Xpert Xpress SARS-CoV-2/FLU/RSV plus assay is intended as an aid in the diagnosis of influenza from Nasopharyngeal swab specimens and should not be used as a sole basis for treatment. Nasal washings and aspirates are unacceptable for Xpert Xpress SARS-CoV-2/FLU/RSV testing.  Fact Sheet for Patients: EntrepreneurPulse.com.au  Fact Sheet for Healthcare Providers: IncredibleEmployment.be  This test is not yet approved or cleared by the [place] FDA and has been authorized for detection and/or diagnosis of SARS-CoV-2 by FDA under an Emergency Use Authorization (EUA). This EUA will remain in effect (meaning this test can be used) for the duration of the COVID-19 declaration under Section 564(b)(1) of the Act, 21 U.S.C. section 360bbb-3(b)(1), unless the authorization is terminated or revoked.  Performed at [facility], [address] [place], [place] [address]      Labs: Basic Metabolic Panel: Recent Labs  Lab 04/14/21 1425 04/15/21 0554  NA 137 138  K 3.9 3.8  CL 103 103  CO2 30 26  GLUCOSE 99 87  BUN 17 10  CREATININE 0.60 0.51  CALCIUM 8.9 8.8*   Liver Function Tests: Recent Labs  Lab 04/14/21 1425  AST 20  ALT 9  ALKPHOS 63  BILITOT 0.9  PROT 6.7  ALBUMIN 4.0   CBC: Recent Labs  Lab 04/14/21 1425 04/15/21 0640  WBC 6.7  4.4  NEUTROABS 4.5  --   HGB 12.7 12.1  HCT 37.1 35.2*  MCV 86.7 86.9  PLT 172 129*   CBG: No results for input(s): GLUCAP in the last 168 hours. Hgb A1c No results for input(s): HGBA1C in the last 72 hours. Lipid Profile No results for input(s): CHOL, HDL, LDLCALC, TRIG, CHOLHDL, LDLDIRECT in the last 72 hours. Thyroid function studies No results for input(s): TSH, T4TOTAL, T3FREE, THYROIDAB in the last 72 hours.  Invalid input(s): FREET3 Urinalysis    Component Value Date/Time   COLORURINE YELLOW (A) 04/14/2021 1629   APPEARANCEUR CLEAR (A)  04/14/2021 1629   APPEARANCEUR Clear 07/27/2019 1611   LABSPEC 1.013 04/14/2021 1629   PHURINE 7.0 04/14/2021 1629   GLUCOSEU NEGATIVE 04/14/2021 1629   HGBUR NEGATIVE 04/14/2021 1629   BILIRUBINUR NEGATIVE 04/14/2021 1629   BILIRUBINUR Negative 07/27/2019 1611   KETONESUR NEGATIVE 04/14/2021 1629   PROTEINUR NEGATIVE 04/14/2021 1629   NITRITE NEGATIVE 04/14/2021 1629   LEUKOCYTESUR MODERATE (A) 04/14/2021 1629    FURTHER DISCHARGE INSTRUCTIONS:   Get Medicines reviewed and adjusted: Please take all your medications with you for your next visit with your Primary MD   Laboratory/radiological data: Please request your Primary MD to go over all hospital tests and procedure/radiological results at the follow up, please ask your Primary MD to get all Hospital records sent to his/her office.   In some cases, they will be blood work, cultures and biopsy results pending at the time of your discharge. Please request that your primary care M.D. goes through all the records of your hospital data and follows up on these results.   Also Note the following: If you experience worsening of your admission symptoms, develop shortness of breath, life threatening emergency, suicidal or homicidal thoughts you must seek medical attention immediately by calling 911 or calling your MD immediately  if symptoms less severe.   You must read complete instructions/literature along with all the possible adverse reactions/side effects for all the Medicines you take and that have been prescribed to you. Take any new Medicines after you have completely understood and accpet all the possible adverse reactions/side effects.    Do not drive when taking Pain medications or sleeping medications (Benzodaizepines)   Do not take more than prescribed Pain, Sleep and Anxiety Medications. It is not advisable to combine anxiety,sleep and pain medications without talking with your primary care practitioner   Special  Instructions: If you have smoked or chewed Tobacco  in the last 2 yrs please stop smoking, stop any regular Alcohol  and or any Recreational drug use.   Wear Seat belts while driving.   Please note: You were cared for by a hospitalist during your hospital stay. Once you are discharged, your primary care physician will handle any further medical issues. Please note that NO REFILLS for any discharge medications will be authorized once you are discharged, as it is imperative that you return to your primary care physician (or establish a relationship with a primary care physician if you do not have one) for your post hospital discharge needs so that they can reassess your need for medications and monitor your lab values.  Time coordinating discharge: 40 minutes  SIGNED:  Marzetta Board, MD, PhD 04/20/2021, 10:33 AM

## 2021-04-21 DIAGNOSIS — E785 Hyperlipidemia, unspecified: Secondary | ICD-10-CM | POA: Diagnosis not present

## 2021-04-21 DIAGNOSIS — M6281 Muscle weakness (generalized): Secondary | ICD-10-CM | POA: Diagnosis not present

## 2021-04-21 DIAGNOSIS — I10 Essential (primary) hypertension: Secondary | ICD-10-CM | POA: Diagnosis not present

## 2021-04-21 DIAGNOSIS — G20 Parkinson's disease: Secondary | ICD-10-CM | POA: Diagnosis not present

## 2021-04-22 DIAGNOSIS — M6281 Muscle weakness (generalized): Secondary | ICD-10-CM | POA: Diagnosis not present

## 2021-04-22 DIAGNOSIS — E785 Hyperlipidemia, unspecified: Secondary | ICD-10-CM | POA: Diagnosis not present

## 2021-04-22 DIAGNOSIS — G20 Parkinson's disease: Secondary | ICD-10-CM | POA: Diagnosis not present

## 2021-04-22 DIAGNOSIS — K219 Gastro-esophageal reflux disease without esophagitis: Secondary | ICD-10-CM | POA: Diagnosis not present

## 2021-04-22 DIAGNOSIS — I10 Essential (primary) hypertension: Secondary | ICD-10-CM | POA: Diagnosis not present

## 2021-04-30 DIAGNOSIS — I10 Essential (primary) hypertension: Secondary | ICD-10-CM | POA: Diagnosis not present

## 2021-04-30 DIAGNOSIS — G20 Parkinson's disease: Secondary | ICD-10-CM | POA: Diagnosis not present

## 2021-05-04 DIAGNOSIS — D649 Anemia, unspecified: Secondary | ICD-10-CM | POA: Diagnosis not present

## 2021-05-05 DIAGNOSIS — G20 Parkinson's disease: Secondary | ICD-10-CM | POA: Diagnosis not present

## 2021-05-05 DIAGNOSIS — D696 Thrombocytopenia, unspecified: Secondary | ICD-10-CM | POA: Diagnosis not present

## 2021-05-05 DIAGNOSIS — I10 Essential (primary) hypertension: Secondary | ICD-10-CM | POA: Diagnosis not present

## 2021-05-05 DIAGNOSIS — N179 Acute kidney failure, unspecified: Secondary | ICD-10-CM | POA: Diagnosis not present

## 2021-05-08 DIAGNOSIS — I10 Essential (primary) hypertension: Secondary | ICD-10-CM | POA: Diagnosis not present

## 2021-05-08 DIAGNOSIS — K219 Gastro-esophageal reflux disease without esophagitis: Secondary | ICD-10-CM | POA: Diagnosis not present

## 2021-05-08 DIAGNOSIS — N179 Acute kidney failure, unspecified: Secondary | ICD-10-CM | POA: Diagnosis not present

## 2021-05-08 DIAGNOSIS — D649 Anemia, unspecified: Secondary | ICD-10-CM | POA: Diagnosis not present

## 2021-05-08 DIAGNOSIS — D696 Thrombocytopenia, unspecified: Secondary | ICD-10-CM | POA: Diagnosis not present

## 2021-05-08 DIAGNOSIS — D519 Vitamin B12 deficiency anemia, unspecified: Secondary | ICD-10-CM | POA: Diagnosis not present

## 2021-05-08 DIAGNOSIS — G4700 Insomnia, unspecified: Secondary | ICD-10-CM | POA: Diagnosis not present

## 2021-05-08 DIAGNOSIS — R2681 Unsteadiness on feet: Secondary | ICD-10-CM | POA: Diagnosis not present

## 2021-05-08 DIAGNOSIS — E785 Hyperlipidemia, unspecified: Secondary | ICD-10-CM | POA: Diagnosis not present

## 2021-05-08 DIAGNOSIS — E569 Vitamin deficiency, unspecified: Secondary | ICD-10-CM | POA: Diagnosis not present

## 2021-05-08 DIAGNOSIS — G9341 Metabolic encephalopathy: Secondary | ICD-10-CM | POA: Diagnosis not present

## 2021-05-08 DIAGNOSIS — M6281 Muscle weakness (generalized): Secondary | ICD-10-CM | POA: Diagnosis not present

## 2021-05-08 DIAGNOSIS — E559 Vitamin D deficiency, unspecified: Secondary | ICD-10-CM | POA: Diagnosis not present

## 2021-05-08 DIAGNOSIS — G20 Parkinson's disease: Secondary | ICD-10-CM | POA: Diagnosis not present

## 2021-05-08 DIAGNOSIS — N390 Urinary tract infection, site not specified: Secondary | ICD-10-CM | POA: Diagnosis not present

## 2021-05-08 DIAGNOSIS — Z736 Limitation of activities due to disability: Secondary | ICD-10-CM | POA: Diagnosis not present

## 2021-05-08 DIAGNOSIS — E875 Hyperkalemia: Secondary | ICD-10-CM | POA: Diagnosis not present

## 2021-05-12 DIAGNOSIS — N179 Acute kidney failure, unspecified: Secondary | ICD-10-CM | POA: Diagnosis not present

## 2021-05-12 DIAGNOSIS — D649 Anemia, unspecified: Secondary | ICD-10-CM | POA: Diagnosis not present

## 2021-05-12 DIAGNOSIS — E875 Hyperkalemia: Secondary | ICD-10-CM | POA: Diagnosis not present

## 2021-05-19 DIAGNOSIS — N179 Acute kidney failure, unspecified: Secondary | ICD-10-CM | POA: Diagnosis not present

## 2021-05-19 DIAGNOSIS — E875 Hyperkalemia: Secondary | ICD-10-CM | POA: Diagnosis not present

## 2021-05-19 DIAGNOSIS — R400 Somnolence: Secondary | ICD-10-CM | POA: Diagnosis not present

## 2021-05-19 DIAGNOSIS — R109 Unspecified abdominal pain: Secondary | ICD-10-CM | POA: Diagnosis not present

## 2021-05-20 DIAGNOSIS — D72829 Elevated white blood cell count, unspecified: Secondary | ICD-10-CM | POA: Diagnosis not present

## 2021-05-20 DIAGNOSIS — N390 Urinary tract infection, site not specified: Secondary | ICD-10-CM | POA: Diagnosis not present

## 2021-05-20 DIAGNOSIS — R413 Other amnesia: Secondary | ICD-10-CM | POA: Diagnosis not present

## 2021-05-20 DIAGNOSIS — E875 Hyperkalemia: Secondary | ICD-10-CM | POA: Diagnosis not present

## 2021-05-20 DIAGNOSIS — E559 Vitamin D deficiency, unspecified: Secondary | ICD-10-CM | POA: Diagnosis not present

## 2021-05-20 DIAGNOSIS — N178 Other acute kidney failure: Secondary | ICD-10-CM | POA: Diagnosis not present

## 2021-05-20 DIAGNOSIS — R946 Abnormal results of thyroid function studies: Secondary | ICD-10-CM | POA: Diagnosis not present

## 2021-05-20 DIAGNOSIS — G301 Alzheimer's disease with late onset: Secondary | ICD-10-CM | POA: Diagnosis not present

## 2021-05-20 DIAGNOSIS — E870 Hyperosmolality and hypernatremia: Secondary | ICD-10-CM | POA: Diagnosis not present

## 2021-05-20 DIAGNOSIS — G231 Progressive supranuclear ophthalmoplegia [Steele-Richardson-Olszewski]: Secondary | ICD-10-CM | POA: Diagnosis not present

## 2021-05-20 DIAGNOSIS — R400 Somnolence: Secondary | ICD-10-CM | POA: Diagnosis not present

## 2021-06-08 DEATH — deceased

## 2021-06-10 ENCOUNTER — Telehealth: Payer: Self-pay

## 2021-06-10 NOTE — Telephone Encounter (Signed)
Copied from [place] [id number]. Topic: General - Deceased Patient >> 06/16/2021 10:23 AM Lennox Solders wrote: Reason for CRM: Pt sister sue parks is calling to let dr Wynetta Emery know the patient has passed away  Route to department's PEC Pool.    Routing to provider, FYI.

## 2022-02-16 IMAGING — CT CT SHOULDER*L* W/O CM
2 series · 13 of 27 positions shown, 16 images · non-contrast
Comparison: None.

CLINICAL DATA: Shoulder pain after falling today.

EXAM:
CT OF THE UPPER LEFT EXTREMITY WITHOUT CONTRAST
TECHNIQUE: Multidetector CT imaging of the left shoulder was performed
according to the standard protocol.

[Series 7: ax st · axial · 0.39mm/px · z∈[-417,-265]mm · 8 of 108 slices shown, 10 images]
[im 9/108  soft-tissue]
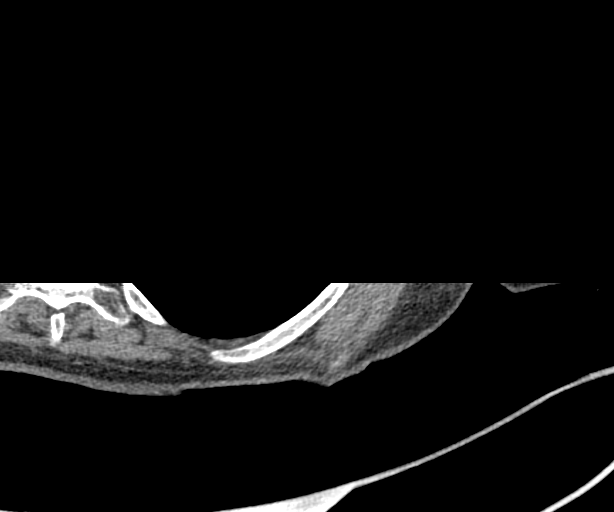
[im 9/108  bone]
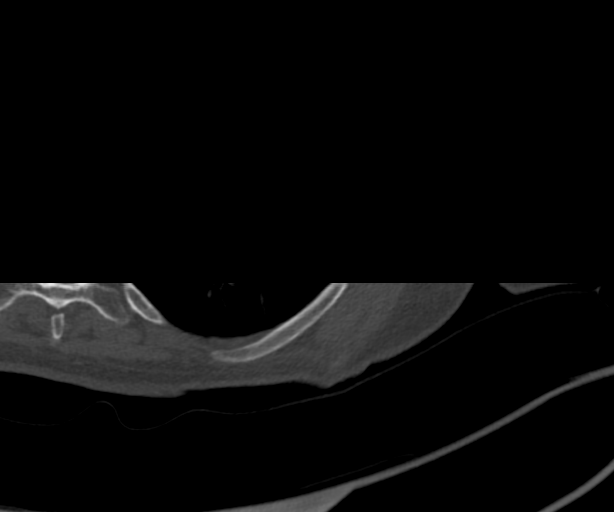
[im 25/108  bone]
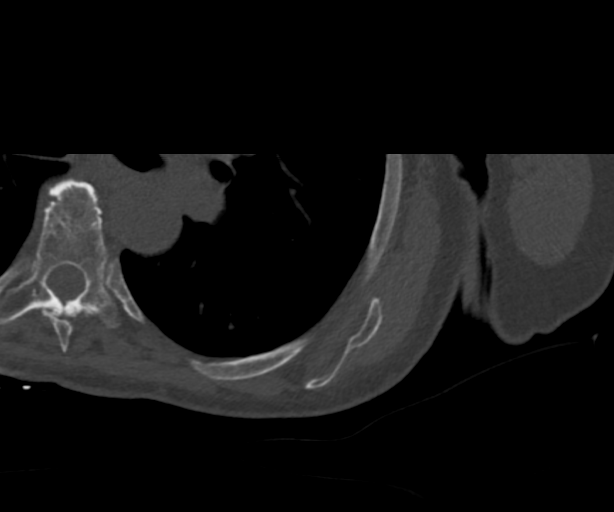
[im 33/108  bone]
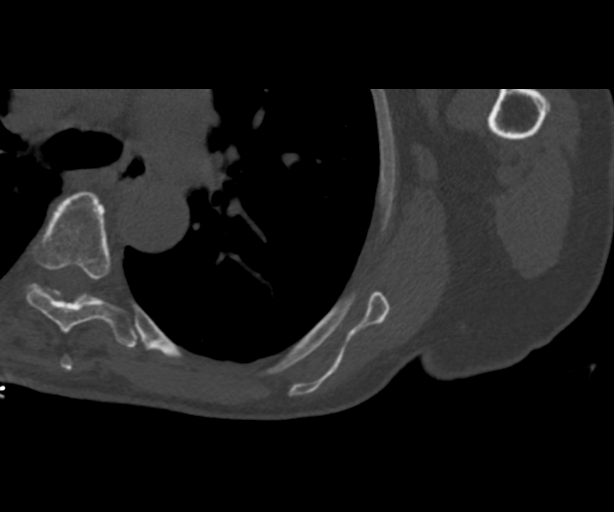
[im 50/108  bone]
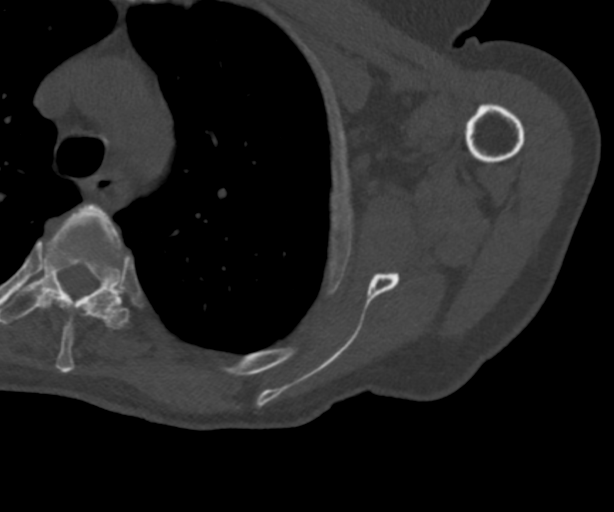
[im 58/108  soft-tissue]
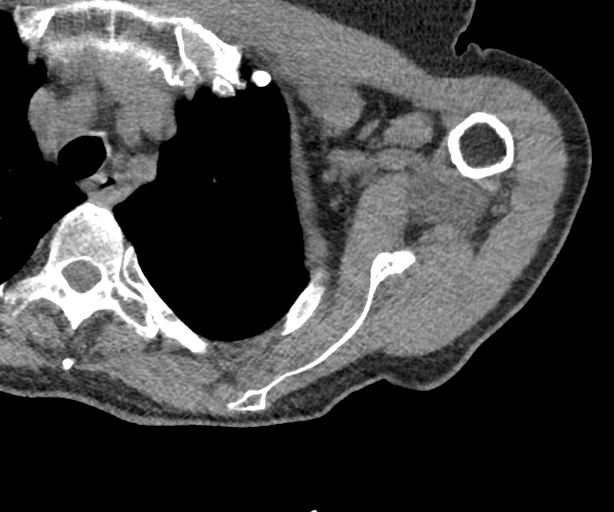
[im 58/108  bone]
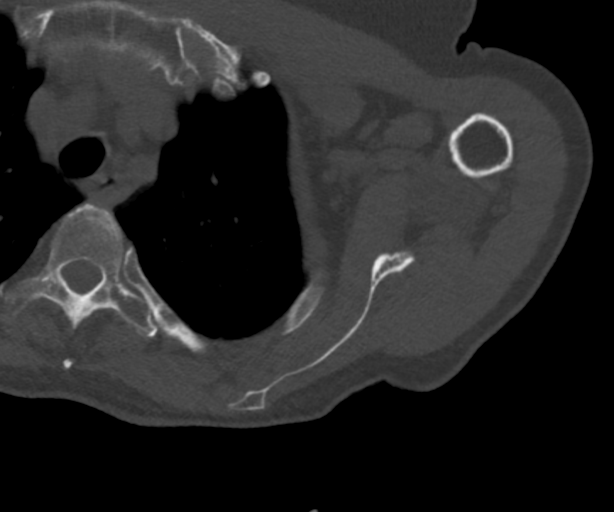
[im 75/108  bone]
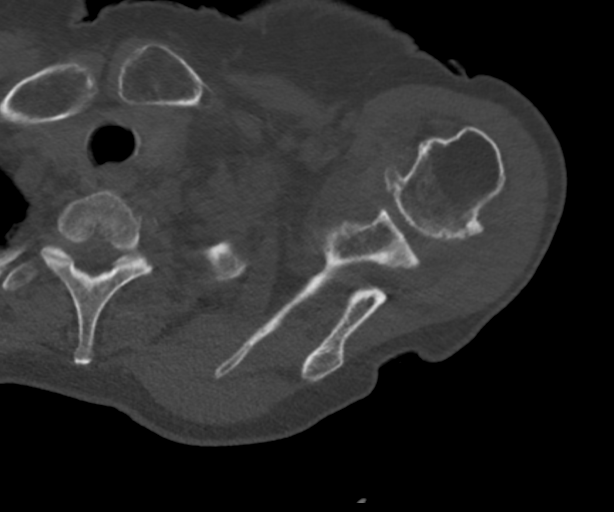
[im 83/108  bone]
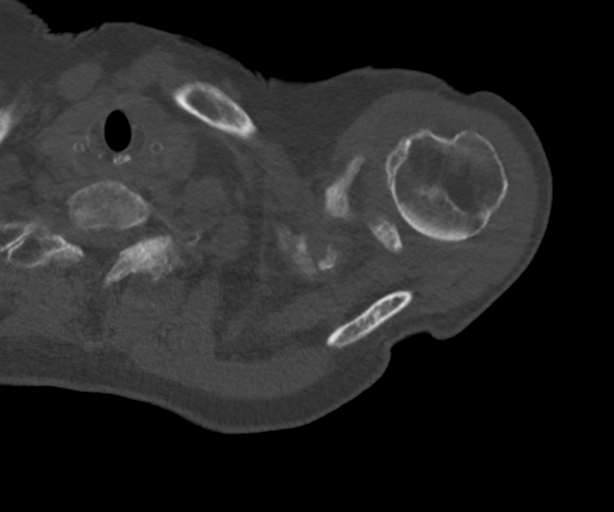
[im 99/108  bone]
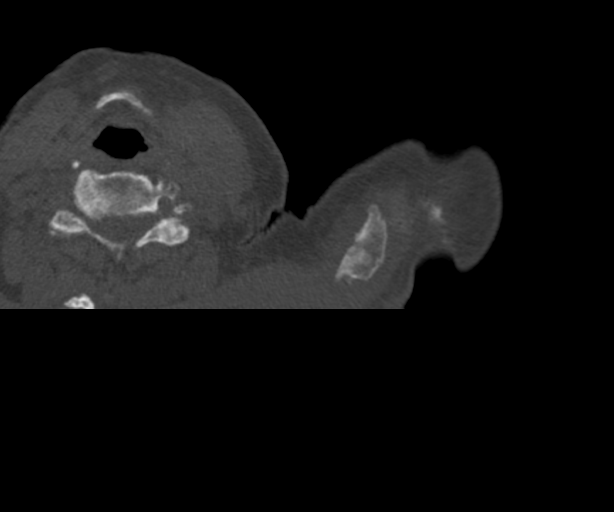

[Series 9: sag st · sagittal · 0.45mm/px · 5 of 101 slices shown, 6 images]
[im 34/101  bone]
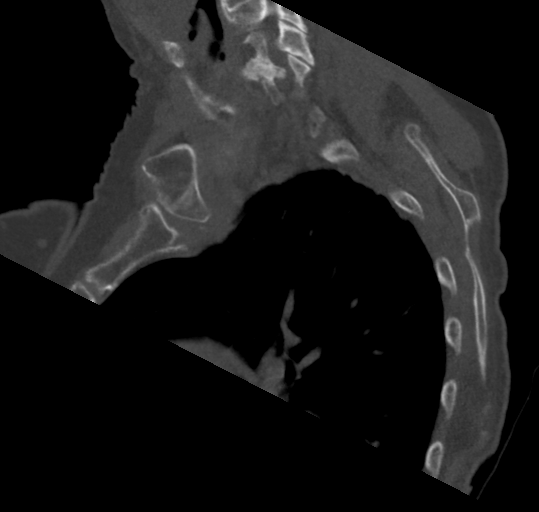
[im 42/101  bone]
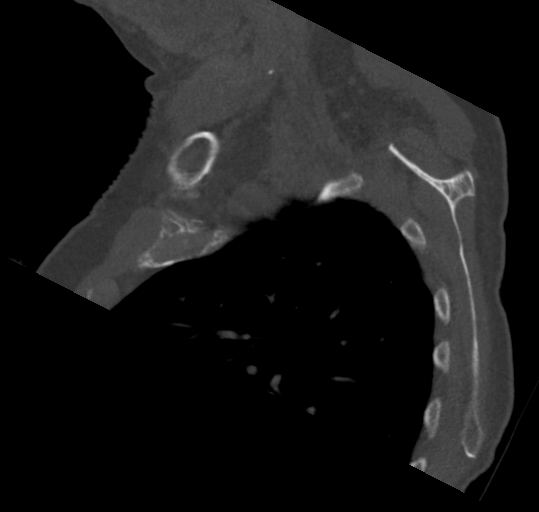
[im 51/101  soft-tissue]
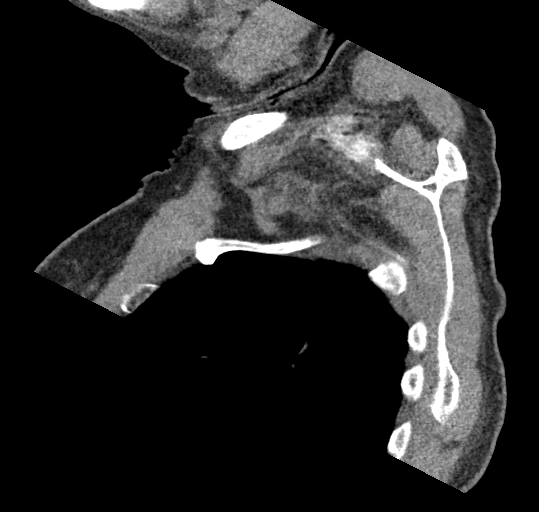
[im 51/101  bone]
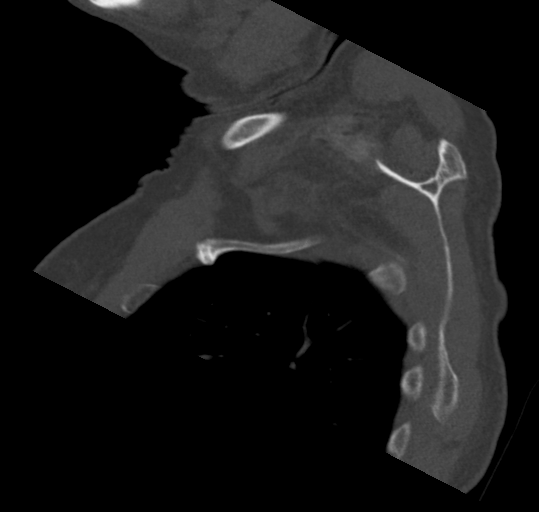
[im 59/101  bone]
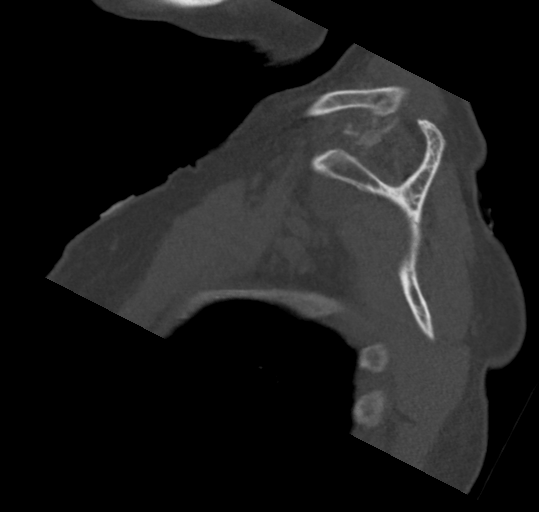
[im 67/101  bone]
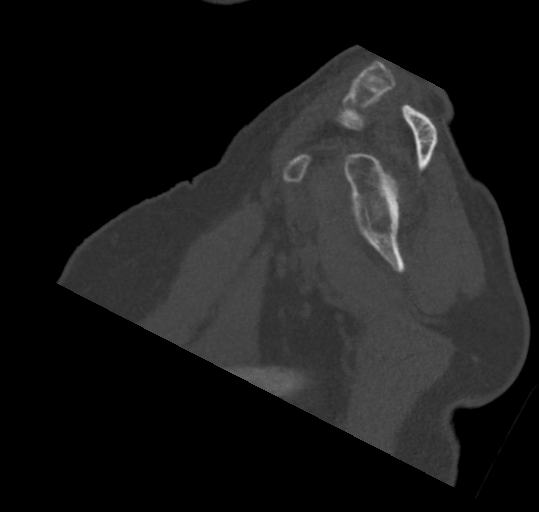

[13 of 27 positions shown; findings below may reference images not displayed]

FINDINGS: Bones/Joint/Cartilage

There is no evidence of acute fracture or dislocation. The proximal
humerus is intact and located. There is a subacute appearing
fracture of the distal clavicle which is mildly comminuted and
displaced. There is associated heterotopic ossification surrounding
this fracture and the coracoclavicular ligament. The left
sternoclavicular and acromioclavicular joints are intact. There are
moderate underlying glenohumeral degenerative changes on the left.
There is moderate narrowing of the left subacromial space and a
moderate-sized left shoulder joint effusion.

Ligaments

Suboptimally assessed by CT.

Muscles and Tendons

Narrowed subacromial space and type 3 acromion likely contribute to
chronic rotator cuff impingement. The supraspinatus muscle appears
mildly atrophy, suggesting a possible chronic underlying rotator
cuff tear.

Soft tissues

No acute soft tissue findings.  Mild aortic atherosclerosis noted.
IMPRESSION: 1. Subacute fracture of the distal left clavicle with associated
heterotopic ossification surrounding this fracture and the
coracoclavicular ligament.
2. No evidence of acute fracture or dislocation.
3. Moderate underlying glenohumeral degenerative changes with a
moderate-sized joint effusion.
4. Narrowed subacromial space and type 3 acromion, likely
contributing to suspected chronic tear of the supraspinatus tendon.
